# Patient Record
Sex: Female | Born: 1960 | Race: Black or African American | Hispanic: No | Marital: Married | Smoking: Never smoker
Health system: Southern US, Community
[De-identification: ages and names within clinical notes are randomized; demographics above are authoritative.]

## PROBLEM LIST (undated history)

## (undated) DIAGNOSIS — K219 Gastro-esophageal reflux disease without esophagitis: Secondary | ICD-10-CM

## (undated) DIAGNOSIS — Z8742 Personal history of other diseases of the female genital tract: Secondary | ICD-10-CM

## (undated) DIAGNOSIS — Z9889 Other specified postprocedural states: Secondary | ICD-10-CM

## (undated) DIAGNOSIS — R7989 Other specified abnormal findings of blood chemistry: Secondary | ICD-10-CM

## (undated) DIAGNOSIS — J358 Other chronic diseases of tonsils and adenoids: Secondary | ICD-10-CM

## (undated) DIAGNOSIS — M5030 Other cervical disc degeneration, unspecified cervical region: Secondary | ICD-10-CM

## (undated) DIAGNOSIS — D72819 Decreased white blood cell count, unspecified: Secondary | ICD-10-CM

## (undated) DIAGNOSIS — R7303 Prediabetes: Secondary | ICD-10-CM

## (undated) DIAGNOSIS — E039 Hypothyroidism, unspecified: Secondary | ICD-10-CM

## (undated) DIAGNOSIS — E785 Hyperlipidemia, unspecified: Secondary | ICD-10-CM

## (undated) DIAGNOSIS — G4733 Obstructive sleep apnea (adult) (pediatric): Secondary | ICD-10-CM

## (undated) DIAGNOSIS — N939 Abnormal uterine and vaginal bleeding, unspecified: Secondary | ICD-10-CM

## (undated) DIAGNOSIS — E041 Nontoxic single thyroid nodule: Secondary | ICD-10-CM

## (undated) DIAGNOSIS — E739 Lactose intolerance, unspecified: Secondary | ICD-10-CM

## (undated) DIAGNOSIS — Z9989 Dependence on other enabling machines and devices: Secondary | ICD-10-CM

## (undated) DIAGNOSIS — Z973 Presence of spectacles and contact lenses: Secondary | ICD-10-CM

## (undated) DIAGNOSIS — N393 Stress incontinence (female) (male): Secondary | ICD-10-CM

## (undated) HISTORY — DX: Decreased white blood cell count, unspecified: D72.819

## (undated) HISTORY — DX: Abnormal uterine and vaginal bleeding, unspecified: N93.9

## (undated) HISTORY — DX: Gastro-esophageal reflux disease without esophagitis: K21.9

## (undated) HISTORY — DX: Hypothyroidism, unspecified: E03.9

## (undated) HISTORY — DX: Lactose intolerance, unspecified: E73.9

## (undated) HISTORY — DX: Other specified abnormal findings of blood chemistry: R79.89

---
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
## 1996-04-24 HISTORY — PX: DILATION AND CURETTAGE OF UTERUS: SHX78

## 2001-04-24 HISTORY — PX: KNEE ARTHROSCOPY: SUR90

## 2006-06-27 ENCOUNTER — Ambulatory Visit: Payer: Self-pay | Admitting: Family Medicine

## 2006-07-02 ENCOUNTER — Encounter: Payer: Self-pay | Admitting: Family Medicine

## 2006-07-02 LAB — CONVERTED CEMR LAB
ALT: 12 units/L (ref 0–35)
AST: 15 units/L (ref 0–37)
Albumin: 4.2 g/dL (ref 3.5–5.2)
Alkaline Phosphatase: 43 units/L (ref 39–117)
BUN: 11 mg/dL (ref 6–23)
Basophils Absolute: 0 10*3/uL (ref 0.0–0.1)
Basophils Relative: 1 % (ref 0–1)
Bilirubin, Direct: 0.1 mg/dL (ref 0.0–0.3)
Chloride: 104 meq/L (ref 96–112)
Cholesterol: 175 mg/dL (ref 0–200)
Creatinine, Ser: 0.98 mg/dL (ref 0.40–1.20)
Glucose, Bld: 72 mg/dL (ref 70–99)
Indirect Bilirubin: 0.7 mg/dL (ref 0.0–0.9)
LDL Cholesterol: 114 mg/dL — ABNORMAL HIGH (ref 0–99)
Lymphocytes Relative: 38 % (ref 12–46)
Monocytes Relative: 9 % (ref 3–11)
Neutro Abs: 1.9 10*3/uL (ref 1.7–7.7)
Nitrite: NEGATIVE
Potassium: 4.1 meq/L (ref 3.5–5.3)
RBC: 4.19 M/uL (ref 3.87–5.11)
RDW: 12.3 % (ref 11.5–14.0)
TSH: 2.324 microintl units/mL (ref 0.350–5.50)
Total CHOL/HDL Ratio: 3.6
Triglycerides: 64 mg/dL (ref <150)
VLDL: 13 mg/dL (ref 0–40)

## 2006-07-04 ENCOUNTER — Ambulatory Visit (HOSPITAL_COMMUNITY): Admission: RE | Admit: 2006-07-04 | Discharge: 2006-07-04 | Payer: Self-pay | Admitting: Family Medicine

## 2006-07-11 ENCOUNTER — Encounter (HOSPITAL_COMMUNITY): Admission: RE | Admit: 2006-07-11 | Discharge: 2006-08-10 | Payer: Self-pay | Admitting: Family Medicine

## 2006-12-26 ENCOUNTER — Ambulatory Visit: Payer: Self-pay | Admitting: Family Medicine

## 2007-01-09 ENCOUNTER — Ambulatory Visit (HOSPITAL_COMMUNITY): Admission: RE | Admit: 2007-01-09 | Discharge: 2007-01-09 | Payer: Self-pay | Admitting: Family Medicine

## 2007-05-15 ENCOUNTER — Ambulatory Visit: Payer: Self-pay | Admitting: Family Medicine

## 2007-05-23 ENCOUNTER — Encounter: Payer: Self-pay | Admitting: Family Medicine

## 2007-05-23 DIAGNOSIS — B351 Tinea unguium: Secondary | ICD-10-CM | POA: Insufficient documentation

## 2007-05-23 DIAGNOSIS — K219 Gastro-esophageal reflux disease without esophagitis: Secondary | ICD-10-CM

## 2007-05-23 DIAGNOSIS — E785 Hyperlipidemia, unspecified: Secondary | ICD-10-CM | POA: Insufficient documentation

## 2007-05-24 ENCOUNTER — Encounter: Payer: Self-pay | Admitting: Family Medicine

## 2007-05-24 LAB — CONVERTED CEMR LAB
Basophils Absolute: 0 10*3/uL (ref 0.0–0.1)
Basophils Relative: 1 % (ref 0–1)
Cholesterol: 167 mg/dL (ref 0–200)
Eosinophils Absolute: 0.1 10*3/uL (ref 0.0–0.7)
Eosinophils Relative: 3 % (ref 0–5)
Hemoglobin: 12.2 g/dL (ref 12.0–15.0)
LDL Cholesterol: 103 mg/dL — ABNORMAL HIGH (ref 0–99)
Lymphocytes Relative: 37 % (ref 12–46)
MCHC: 33.5 g/dL (ref 30.0–36.0)
Monocytes Absolute: 0.4 10*3/uL (ref 0.1–1.0)
Platelets: 150 10*3/uL (ref 150–400)
RBC: 4.17 M/uL (ref 3.87–5.11)
RDW: 12.2 % (ref 11.5–15.5)
WBC: 3.2 10*3/uL — ABNORMAL LOW (ref 4.0–10.5)

## 2007-05-29 ENCOUNTER — Ambulatory Visit: Payer: Self-pay | Admitting: Gastroenterology

## 2007-10-15 ENCOUNTER — Ambulatory Visit (HOSPITAL_COMMUNITY): Admission: RE | Admit: 2007-10-15 | Discharge: 2007-10-15 | Payer: Self-pay | Admitting: Family Medicine

## 2007-10-15 ENCOUNTER — Ambulatory Visit: Payer: Self-pay | Admitting: Family Medicine

## 2007-10-15 LAB — CONVERTED CEMR LAB
Basophils Absolute: 0 10*3/uL (ref 0.0–0.1)
Basophils Relative: 0 % (ref 0–1)
Eosinophils Absolute: 0.1 10*3/uL (ref 0.0–0.7)
HCT: 33.3 % — ABNORMAL LOW (ref 36.0–46.0)
Hemoglobin: 11.7 g/dL — ABNORMAL LOW (ref 12.0–15.0)
Lymphocytes Relative: 24 % (ref 12–46)
Lymphs Abs: 0.7 10*3/uL (ref 0.7–4.0)
MCHC: 35.2 g/dL (ref 30.0–36.0)
MCV: 86.8 fL (ref 78.0–100.0)
RBC: 3.84 M/uL — ABNORMAL LOW (ref 3.87–5.11)
RDW: 12.8 % (ref 11.5–15.5)
WBC: 3.1 10*3/uL — ABNORMAL LOW (ref 4.0–10.5)

## 2007-11-08 ENCOUNTER — Ambulatory Visit: Payer: Self-pay | Admitting: Family Medicine

## 2007-11-08 ENCOUNTER — Ambulatory Visit (HOSPITAL_COMMUNITY): Admission: RE | Admit: 2007-11-08 | Discharge: 2007-11-08 | Payer: Self-pay | Admitting: Family Medicine

## 2007-11-08 LAB — CONVERTED CEMR LAB
ALT: 22 units/L (ref 0–35)
AST: 21 units/L (ref 0–37)
Alkaline Phosphatase: 42 units/L (ref 39–117)
Amylase: 83 units/L (ref 27–131)
BUN: 9 mg/dL (ref 6–23)
Bilirubin, Direct: 0.1 mg/dL (ref 0.0–0.3)
CO2: 27 meq/L (ref 19–32)
Indirect Bilirubin: 0.8 mg/dL (ref 0.0–0.9)
Lipase: 16 units/L (ref 11–59)
Lymphocytes Relative: 15 % (ref 12–46)
Lymphs Abs: 0.8 10*3/uL (ref 0.7–4.0)
Monocytes Absolute: 0.6 10*3/uL (ref 0.1–1.0)
RBC: 3.85 M/uL — ABNORMAL LOW (ref 3.87–5.11)
RDW: 12.6 % (ref 11.5–15.5)
Sed Rate: 44 mm/hr — ABNORMAL HIGH (ref 0–22)
Total Bilirubin: 0.9 mg/dL (ref 0.3–1.2)
Total Protein: 7 g/dL (ref 6.0–8.3)
WBC: 5.2 10*3/uL (ref 4.0–10.5)

## 2007-11-09 ENCOUNTER — Encounter: Payer: Self-pay | Admitting: Family Medicine

## 2007-11-09 LAB — CONVERTED CEMR LAB
Hemoglobin, Urine: NEGATIVE
Ketones, ur: NEGATIVE mg/dL
Leukocytes, UA: NEGATIVE
pH: 6.5 (ref 5.0–8.0)

## 2007-11-29 ENCOUNTER — Encounter: Payer: Self-pay | Admitting: Family Medicine

## 2007-11-29 ENCOUNTER — Ambulatory Visit: Payer: Self-pay | Admitting: Family Medicine

## 2007-12-04 ENCOUNTER — Ambulatory Visit: Payer: Self-pay | Admitting: Infectious Disease

## 2007-12-04 DIAGNOSIS — N83209 Unspecified ovarian cyst, unspecified side: Secondary | ICD-10-CM | POA: Insufficient documentation

## 2007-12-04 DIAGNOSIS — Z9189 Other specified personal risk factors, not elsewhere classified: Secondary | ICD-10-CM | POA: Insufficient documentation

## 2007-12-04 DIAGNOSIS — R109 Unspecified abdominal pain: Secondary | ICD-10-CM

## 2007-12-04 LAB — CONVERTED CEMR LAB
Alkaline Phosphatase: 49 units/L (ref 39–117)
Basophils Absolute: 0 10*3/uL (ref 0.0–0.1)
Basophils Relative: 0 % (ref 0–1)
Chloride: 105 meq/L (ref 96–112)
HCT: 35.2 % — ABNORMAL LOW (ref 36.0–46.0)
Hemoglobin: 11.7 g/dL — ABNORMAL LOW (ref 12.0–15.0)
Lymphs Abs: 1.5 10*3/uL (ref 0.7–4.0)
Monocytes Absolute: 0.3 10*3/uL (ref 0.1–1.0)
Potassium: 4 meq/L (ref 3.5–5.3)
RBC: 4.12 M/uL (ref 3.87–5.11)
RDW: 12.5 % (ref 11.5–15.5)
WBC: 3.9 10*3/uL — ABNORMAL LOW (ref 4.0–10.5)

## 2009-04-02 ENCOUNTER — Ambulatory Visit (HOSPITAL_COMMUNITY): Admission: RE | Admit: 2009-04-02 | Discharge: 2009-04-02 | Payer: Self-pay | Admitting: Family Medicine

## 2009-04-14 ENCOUNTER — Ambulatory Visit: Payer: Self-pay | Admitting: Family Medicine

## 2009-04-24 DIAGNOSIS — E663 Overweight: Secondary | ICD-10-CM | POA: Insufficient documentation

## 2009-04-24 DIAGNOSIS — M25569 Pain in unspecified knee: Secondary | ICD-10-CM | POA: Insufficient documentation

## 2009-04-24 DIAGNOSIS — E669 Obesity, unspecified: Secondary | ICD-10-CM | POA: Insufficient documentation

## 2009-05-29 ENCOUNTER — Encounter: Payer: Self-pay | Admitting: Family Medicine

## 2009-05-29 LAB — CONVERTED CEMR LAB
Basophils Absolute: 0 10*3/uL (ref 0.0–0.1)
Basophils Relative: 1 % (ref 0–1)
CO2: 27 meq/L (ref 19–32)
Calcium: 8.2 mg/dL — ABNORMAL LOW (ref 8.4–10.5)
Chloride: 105 meq/L (ref 96–112)
Creatinine, Ser: 0.91 mg/dL (ref 0.40–1.20)
Eosinophils Absolute: 0.1 10*3/uL (ref 0.0–0.7)
Eosinophils Relative: 4 % (ref 0–5)
HCT: 33.7 % — ABNORMAL LOW (ref 36.0–46.0)
Lymphocytes Relative: 38 % (ref 12–46)
Lymphs Abs: 1.2 10*3/uL (ref 0.7–4.0)
MCV: 87.5 fL (ref 78.0–100.0)
Monocytes Relative: 16 % — ABNORMAL HIGH (ref 3–12)
Neutro Abs: 1.3 10*3/uL — ABNORMAL LOW (ref 1.7–7.7)
Platelets: 167 10*3/uL (ref 150–400)
Potassium: 3.9 meq/L (ref 3.5–5.3)
RDW: 12.4 % (ref 11.5–15.5)
Total CHOL/HDL Ratio: 3.5
Triglycerides: 102 mg/dL (ref <150)
VLDL: 20 mg/dL (ref 0–40)
WBC: 3 10*3/uL — ABNORMAL LOW (ref 4.0–10.5)

## 2009-09-15 ENCOUNTER — Ambulatory Visit: Payer: Self-pay | Admitting: Family Medicine

## 2009-09-15 DIAGNOSIS — R5381 Other malaise: Secondary | ICD-10-CM

## 2009-09-15 DIAGNOSIS — D649 Anemia, unspecified: Secondary | ICD-10-CM

## 2009-09-15 DIAGNOSIS — R5383 Other fatigue: Secondary | ICD-10-CM

## 2009-09-15 DIAGNOSIS — D72819 Decreased white blood cell count, unspecified: Secondary | ICD-10-CM | POA: Insufficient documentation

## 2009-09-22 ENCOUNTER — Encounter: Payer: Self-pay | Admitting: Family Medicine

## 2009-09-22 LAB — CONVERTED CEMR LAB
Basophils Absolute: 0 10*3/uL (ref 0.0–0.1)
Basophils Relative: 0 % (ref 0–1)
Eosinophils Absolute: 0.1 10*3/uL (ref 0.0–0.7)
Eosinophils Relative: 2 % (ref 0–5)
HCT: 34.2 % — ABNORMAL LOW (ref 36.0–46.0)
Hemoglobin: 11.7 g/dL — ABNORMAL LOW (ref 12.0–15.0)
Lymphocytes Relative: 35 % (ref 12–46)
Lymphs Abs: 1.8 10*3/uL (ref 0.7–4.0)
MCHC: 34.3 g/dL (ref 30.0–36.0)
MCV: 87.7 fL (ref 78.0–100.0)
Monocytes Absolute: 0.5 10*3/uL (ref 0.1–1.0)
Monocytes Relative: 9 % (ref 3–12)
Neutro Abs: 2.8 10*3/uL (ref 1.7–7.7)
Neutrophils Relative %: 54 % (ref 43–77)
Platelets: 156 10*3/uL (ref 150–400)
RBC: 3.91 M/uL (ref 3.87–5.11)
RDW: 13.2 % (ref 11.5–15.5)
WBC: 5.2 10*3/uL (ref 4.0–10.5)

## 2009-12-03 ENCOUNTER — Ambulatory Visit: Payer: Self-pay | Admitting: Family Medicine

## 2009-12-03 DIAGNOSIS — R079 Chest pain, unspecified: Secondary | ICD-10-CM

## 2009-12-09 ENCOUNTER — Encounter (INDEPENDENT_AMBULATORY_CARE_PROVIDER_SITE_OTHER): Payer: Self-pay | Admitting: *Deleted

## 2010-03-04 ENCOUNTER — Ambulatory Visit: Payer: Self-pay | Admitting: Cardiology

## 2010-03-04 DIAGNOSIS — R9431 Abnormal electrocardiogram [ECG] [EKG]: Secondary | ICD-10-CM

## 2010-03-10 ENCOUNTER — Ambulatory Visit: Payer: Self-pay | Admitting: Cardiology

## 2010-03-10 ENCOUNTER — Ambulatory Visit (HOSPITAL_COMMUNITY): Admission: RE | Admit: 2010-03-10 | Discharge: 2010-03-10 | Payer: Self-pay | Admitting: Cardiology

## 2010-03-23 ENCOUNTER — Ambulatory Visit: Payer: Self-pay | Admitting: Family Medicine

## 2010-05-24 NOTE — Letter (Signed)
Summary: Appointment - Missed  [facility] [place]  [address] [place] [address]   Phone: [phone]  Fax: [phone]     December 09, 2009 MRN: [id number]   [facility] [address] [place], [place]  [address]   Dear [facility],  Our records indicate you missed your appointment on        12/08/09                with Dr.       .    Daleen Squibb                                It is very important that we reach you to reschedule this appointment. We look forward to participating in your health care needs. Please contact us at the number listed above at your earliest convenience to reschedule this appointment.     Sincerely,    Glass blower/designer

## 2010-05-24 NOTE — Assessment & Plan Note (Signed)
Summary: np6/rm   Visit Type:  Initial Consult Primary Provider:  Dr. Syliva Overman   History of Present Illness: 50 year old woman presents for cardiology consultation. She presents with a history of intermittent chest pain since August, described as a "pressure" typically on the left side of her chest, usually in the morning hours, and more noticeable during stressful situations. 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 She states that between a busy practice and 4 children, she has had increased stress. She has occasional headaches and chest pain episodes that are not specifically exertional. Symptoms actually got better after their initial onset in August, however within the last week or two, she has been having more episodes, somewhat more intense, one that woke her up in the morning approximately a week ago. She just recently started on Prilosec with concerns about possible reflux as an etiology.  Otherwise she has no history of hypertension, diabetes mellitus, cardiac dysrhythmia. She has had "mild" hyperlipidemia that has been treated by diet modifications. No clear family history of premature cardiovascular disease, although the patient states that her mother was diagnosed with "angina" perhaps in her 58s to 74s. Not certain whether she had definitive CAD or not.  Preventive Screening-Counseling & Management  Caffeine-Diet-Exercise     Does Patient Exercise: no  Current Medications (verified): 1)  Ginkoba 40 Mg Tabs (Ginkgo Biloba) .... Take 1 Tab Daily 2)  Daily-Vitamin  Tabs (Multiple Vitamin) .... Take 1 Tab Daily 3)  Prilosec Otc 20 Mg Tbec (Omeprazole Magnesium) .... Take 1 Tab Daily 4)  Aspirin 325 Mg Tabs (Aspirin) .... Take 1 Tab Daily  Allergies (verified): No Known Drug Allergies  Past History:  Past Surgical History: Last updated: 05/23/2007 D & C 1998 secondary to miscarriage Arthroscopy right knee 2003  Family  History: Last updated: 03/04/2010 Mother: HTN, angina, MVP, h/o intestinal polyps possibly Father: HTN, hyperlipidemia and glucose intolerance 2 brothers, 1 with GERD No colon, breat or ovarian cancer history  Social History: Last updated: 03/04/2010 Never Smoked Alcohol use - no Drug use - no Full Time - dentist Married (Dr. Gerilyn Pilgrim, neurology) Regular Exercise - no  Past Medical History: GERD Onychomycosis, left great toe Lactose intolerance Hashimoto's thyroiditis Hyperlipidemia  Family History: Mother: HTN, angina, MVP, h/o intestinal polyps possibly Father: HTN, hyperlipidemia and glucose intolerance 2 brothers, 1 with GERD No colon, breat or ovarian cancer history  Social History: Never Smoked Alcohol use - no Drug use - no Full Time - dentist Married (Dr. Gerilyn Pilgrim, neurology) Regular Exercise - no  Review of Systems       The patient complains of chest pain and headaches.  The patient denies anorexia, fever, weight gain, syncope, dyspnea on exertion, peripheral edema, prolonged cough, abdominal pain, melena, and hematochezia.         Otherwise reviewed and negative except as outlined.  Vital Signs:  Patient profile:   50 year old female Menstrual status:  regular Weight:      174 pounds BMI:     29.06 Pulse rate:   67 / minute BP sitting:   119 / 60  (right arm)  Vitals Entered By: Dreama Saa, CNA (March 04, 2010 2:20 PM)  Physical Exam  Additional Exam:  Pleasant overweight woman in no acute distress. HEENT: Conjunctiva and lids are normal, oropharynx with moist mucosa. Neck: Supple, no elevated JVP, no bruits, no thyromegaly. Lungs: Clear to auscultation, nonlabored. Cardiac: Regular rate and rhythm, no S3  gallop, no significant systolic murmur or rub. Abdomen: Soft, nontender, bowel sounds present. Extremities: No pitting edema, distal pulses are full. Skin: Warm and dry. Musculoskeletal: No kyphosis. Neuropsychiatric: Alert and  oriented x3, affect appropriate.   EKG  Procedure date:  03/04/2010  Findings:      Sinus rhythm at 63 beats per minute, possible left atrial enlargement, nonspecific T wave changes, inversion in the precordial leads. Decreased R wave progression anteriorly without frank Q waves.  Impression & Recommendations:  Problem # 1:  CHEST PAIN UNSPECIFIED (ICD-786.50)  Patient reports chest pain with both typical and atypical features for angina. Principle cardiac risk factors would be mild hyperlipidemia, possibly some cardiac history in her mother although details are not entirely clear. Resting ECG is abnormal, although overall nonspecific. She has been under a lot of stress as noted above. Symptoms have recurred after an initial improvement since August, and are somewhat more intense. She just started a proton pump inhibitor, and has not yet experienced resolution of symptoms. Reflux does remain a possibility however. In light of all these issues, we discussed proceeding with an exercise echocardiogram for more objective ischemic evaluation. If this study is reassuring, I doubt that we will need to pursue further cardiac testing at this time. This can always be reconsidered if her symptoms progress. Otherwise if stress testing is abnormal, we will bring her back to discuss additional evaluation.  Her updated medication list for this problem includes:    Aspirin 325 Mg Tabs (Aspirin) .Marland Kitchen... Take 1 tab daily  Orders: Stress Echo (Stress Echo)  Problem # 2:  ABNORMAL ELECTROCARDIOGRAM (ICD-794.31)  Overall nonspecific as described above.  Her updated medication list for this problem includes:    Aspirin 325 Mg Tabs (Aspirin) .Marland Kitchen... Take 1 tab daily  Orders: Stress Echo (Stress Echo)  Patient Instructions: 1)  Your physician recommends that you schedule a follow-up appointment in: we will contact you with test results 2)  Your physician recommends that you continue on your current medications  as directed. Please refer to the Current Medication list given to you today. 3)  Your physician has requested that you have a stress echocardiogram. For further information please visit [email or web address]  Please follow instruction sheet as given.

## 2010-05-24 NOTE — Letter (Signed)
Summary: Stress Echocardiogram Information Sheet  [facility]  [address] [place] [address]   Phone: [phone]  Fax: [phone]      March 04, 2010 MRN: [id number] light prior to the test.   [facility]  Doctor: Appointment Date: Appointment Time: Appointment Location: [facility]  Stress Echocardiogram Information Sheet    Instructions:   1. You may take your medications.  2. Do not eat or drink after midnight the night prior to test..  3. Dress prepared to exercise.  4. DO NOT use ANY caffine or tobacco products 3 hours before appointment.  5. Report to the Short Stay Center on the1st floor.  6. Please bring all current prescription medications.  7. If you have any questions, please call [phone]

## 2010-05-24 NOTE — Assessment & Plan Note (Signed)
Summary: office visit   Vital Signs:  Patient profile:   50 year old female Menstrual status:  regular Height:      65 inches Weight:      173.75 pounds BMI:     29.02 O2 Sat:      99 % Pulse rate:   74 / minute Pulse rhythm:   regular Resp:     16 per minute BP sitting:   104 / 68  (left arm) Cuff size:   large  Vitals Entered By: Everitt Amber LPN (Sep 15, 2009 1:42 PM)  Nutrition Counseling: Patient's BMI is greater than 25 and therefore counseled on weight management options. CC: Follow up chronic problems Is Patient Diabetic? No   Primary Care Provider:  Syliva Overman MD  CC:  Follow up chronic problems.  History of Present Illness: Reports  that she has been doing well. She remains extremelyu busy and often neglects her health in terms of commitment to exercise and dietary change , however , she has improved somewhat.She has to her surprise actually lost weight . Denies recent fever or chills. Denies sinus pressure, nasal congestion , ear pain or sore throat. Denies chest congestion, or cough productive of sputum. Denies chest pain, palpitations, PND, orthopnea or leg swelling. Denies abdominal pain, nausea, vomitting, diarrhea or constipation. Denies change in bowel movements or bloody stool. Denies dysuria , frequency, incontinence or hesitancy. Denies  joint pain, swelling, or reduced mobility. Denies headaches, vertigo, seizures. Denies depression, anxiety or insomnia. Denies  rash, lesions, or itch.     Current Medications (verified): 1)  None  Allergies (verified): No Known Drug Allergies  Family History: Mother LIVING   Mitral Valve Prolapse, h/o intestinal polyps possibly Father LVING Hyperlipidemia and Glucose intolerance 2 brothers LIVING  1 with GERD No colon, breat or ovarian cancer history.  Review of Systems      See HPI Eyes:  Denies blurring, discharge, eye pain, and red eye. Endo:  Denies cold intolerance, excessive hunger, excessive  thirst, excessive urination, heat intolerance, polyuria, and weight change. Heme:  Denies abnormal bruising and bleeding. Allergy:  Denies hives or rash and itching eyes.  Physical Exam  General:  Well-developed,well-nourished,in no acute distress; alert,appropriate and cooperative throughout examination HEENT: No facial asymmetry,  EOMI, No sinus tenderness, TM's Clear, oropharynx  pink and moist.   Chest: Clear to auscultation bilaterally.  CVS: S1, S2, No murmurs, No S3.   Abd: Soft, Nontender.  MS: Adequate ROM spine, hips, shoulders and  reduced in left  knee.  Ext: No edema.   CNS: CN 2-12 intact, power tone and sensation normal throughout.   Skin: Intact, no visible lesions or rashes.  Psych: Good eye contact, normal affect.  Memory intact, not anxious or depressed appearing.    Impression & Recommendations:  Problem # 1:  ANEMIA (ICD-285.9) Assessment Comment Only  Orders: T- * Misc. Laboratory test [id number])  Hgb: 11.6 (05/29/2009)   Hct: 33.7 (05/29/2009)   Platelets: 167 (05/29/2009) RBC: 3.85 (05/29/2009)   RDW: 12.4 (05/29/2009)   WBC: 3.0 (05/29/2009) MCV: 87.5 (05/29/2009)   MCHC: 34.4 (05/29/2009) TSH: 4.488 (05/29/2009)  Problem # 2:  LEUKOPENIA, CHRONIC (ICD-288.50) Assessment: Improved  Orders: Hematology Referral (Hematology), rept cbc shows nornmal wbc will cancel referral, al;so pt does not have recurrent infetions  Problem # 3:  KNEE PAIN (ICD-719.46) Assessment: Improved  Problem # 4:  OVERWEIGHT (ICD-278.02) Assessment: Improved  Ht: 65 (09/15/2009)   Wt: 173.75 (09/15/2009)   BMI: 29.02 (09/15/2009)  Other Orders: T-CBC w/Diff [id number]) T-Vitamin D (25-Hydroxy) [id number])  Patient Instructions: 1)  F/u in 5months and 3.5 weeks. 2)  It is important that you exercise regularly at least 30 minutes 6 times a week. If you develop chest pain, have severe difficulty breathing, or feel very tired , stop exercising immediately and seek  medical attention. 3)  You need to lose weight. Consider a lower calorie diet and regular exercise. congrats keep it up. 4)  pLAn to have your colonscopy and upper endo next Jan. 5)  cBC stat and anemia panel, alson vit d level  Appended Document: office visit pls cancel hematology referral, not needed at this time  Appended Document: office visit This has been cancelled

## 2010-05-24 NOTE — Assessment & Plan Note (Signed)
Summary: F UP   Vital Signs:  Patient profile:   50 year old female Menstrual status:  regular Height:      65 inches Weight:      175.75 pounds BMI:     29.35 O2 Sat:      99 % on Room air Pulse rate:   63 / minute Pulse rhythm:   regular Resp:     16 per minute BP sitting:   126 / 82  (left arm)  Vitals Entered By: Adella Hare LPN (March 23, 2010 10:40 AM)  Nutrition Counseling: Patient's BMI is greater than 25 and therefore counseled on weight management options.  O2 Flow:  Room air CC: follow-up visit Is Patient Diabetic? No Pain Assessment Patient in pain? no        Primary Care C[place] Moss:  Dr. Syliva Overman  CC:  follow-up visit.  History of Present Illness: Reports  that she has been doing fairly well, nufortunately , her hectic lifestyle is unchanged, and she again recognizes and acknowledges the need to make these changes. Since her last visit , she had a cardiology eval and was cleared. Denies recent fever or chills. Denies sinus pressure, nasal congestion , ear pain or sore throat. Denies chest congestion, or cough productive of sputum. Denies chest pain, palpitations, PND, orthopnea or leg swelling. Denies abdominal pain, nausea, vomitting, diarrhea or constipation. Denies change in bowel movements or bloody stool. Denies dysuria , frequency, incontinence or hesitancy. Denies  joint pain, swelling, or reduced mobility. Denies headaches, vertigo, seizures. Denies depression, anxiety or insomnia. Denies  rash, lesions, or itch.     Current Medications (verified): 1)  Ginkoba 40 Mg Tabs (Ginkgo Biloba) .... Take 1 Tab Daily 2)  Daily-Vitamin  Tabs (Multiple Vitamin) .... Take 1 Tab Daily  Allergies (verified): No Known Drug Allergies  Review of Systems      See HPI General:  Complains of fatigue. Eyes:  Complains of vision loss-both eyes; denies blurring, discharge, eye pain, and red eye. Endo:  Denies cold intolerance, excessive hunger,  excessive thirst, excessive urination, and heat intolerance. Heme:  Denies abnormal bruising and bleeding. Allergy:  Denies hives or rash and itching eyes.  Physical Exam  General:  Well-developed,overweight,in no acute distress; alert,appropriate and cooperative throughout examination HEENT: No facial asymmetry,  EOMI, No sinus tenderness, TM's Clear, oropharynx  pink and moist.   Chest: Clear to auscultation bilaterally.  CVS: S1, S2, No murmurs, No S3.   Abd: Soft, Nontender.  MS: Adequate ROM spine, hips, shoulders and knees.  Ext: No edema.   CNS: CN 2-12 intact, power tone and sensation normal throughout.   Skin: Intact, no visible lesions or rashes.  Psych: Good eye contact, normal affect.  Memory intact, not anxious or depressed appearing.    Impression & Recommendations:  Problem # 1:  OVERWEIGHT (ICD-278.02) Assessment Unchanged  Ht: 65 (03/23/2010)   Wt: 175.75 (03/23/2010)   BMI: 29.35 (03/23/2010)  Complete Medication List: 1)  Ginkoba 40 Mg Tabs (Ginkgo biloba) .... Take 1 tab daily 2)  Daily-vitamin Tabs (Multiple vitamin) .... Take 1 tab daily  Other Orders: T-Basic Metabolic Panel [id number]) T-Lipid Profile [id number]) T-CBC w/Diff [id number])  Patient Instructions: 1)  Please schedule a follow-up appointment in 4.5 months. 2)  It is important that you exercise regularly at least 20 minutes 5 times a week. If you develop chest pain, have severe difficulty breathing, or feel very tired , stop exercising immediately and seek medical attention. 3)  You  need to lose weight. Consider a lower calorie diet and regular exercise.    Orders Added: 1)  Est. Patient Level III [[address]] 2)  T-Basic Metabolic Panel [[id number]] 3)  T-Lipid Profile [[id number]] 4)  T-CBC w/Diff [[id number]]

## 2010-05-24 NOTE — Letter (Signed)
Summary: Letter  Letter   Imported By: Lind Guest 09/23/2009 07:55:08  _____________________________________________________________________  External Attachment:    Type:   Image     Comment:   External Document

## 2010-05-24 NOTE — Assessment & Plan Note (Signed)
Summary: OV   Vital Signs:  Patient profile:   50 year old female Menstrual status:  regular Height:      65 inches Weight:      177 pounds BMI:     29.56 O2 Sat:      99 % Pulse rate:   68 / minute Pulse rhythm:   regular Resp:     16 per minute BP sitting:   120 / 70  (left arm) Cuff size:   large  Vitals Entered By: Everitt Amber LPN (December 03, 2009 9:36 AM)  Nutrition Counseling: Patient's BMI is greater than 25 and therefore counseled on weight management options. CC: Follow up chronic problems   Primary Care Provider:  Syliva Overman MD  CC:  Follow up chronic problems.  History of Present Illness: chest pain x 3 weeks, primarily aggravated by upper body movemen this week at the end of the day she asked to be left while experiencing these symptoms.No other cv signs with it, specifically denies radiation, near syncope, diaphoresis, light headedness. She states since the episodes have been recurrent and increasing in frequency, her spouse became concerned also, and advised she get checked. 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 She is al;so the mother of 4 children. she doe snot incorporate downtime regulalrly for herself either for exercise or relaxation, and on a recent vacation she was stilldoing work [employer].  she has noted excessive belching and gERD symptoms has started again in the past 1 week, prilosec has helped.belching  is still excessive   Allergies: No Known Drug Allergies  Review of Systems      See HPI General:  Complains of fatigue; INADEQUATE  sleep most of the time 4 to 5 hrs, always working . Eyes:  Complains of vision loss-both eyes; wears corrective lenses. ENT:  Denies hoarseness, nasal congestion, and sinus pressure. CV:  Complains of chest pain or discomfort; 3 week h/o chest oppainextending from left side , substernal to right  anterior. Resp:  Denies cough and sputum productive. GI:  Complains of abdominal pain; denies constipation, diarrhea, nausea, and vomiting; gerd recent flare , oTC med working. GU:  Denies dysuria and urinary frequency. MS:  Denies joint pain and stiffness. Derm:  Denies itching and rash. Neuro:  Denies headaches, seizures, and sensation of room spinning. Psych:  Denies anxiety and depression. Endo:  Denies cold intolerance, excessive hunger, excessive thirst, excessive urination, heat intolerance, polyuria, and weight change. Heme:  Denies abnormal bruising and bleeding. Allergy:  Denies hives or rash and itching eyes.  Physical Exam  General:  Well-developed,well-nourished,in no acute distress; alert,appropriate and cooperative throughout examination HEENT: No facial asymmetry,  EOMI, No sinus tenderness, TM's Clear, oropharynx  pink and moist.   Chest: Clear to auscultation bilaterally.  CVS: S1, S2, No murmurs, No S3.   Abd: Soft, Nontender.  MS: Adequate ROM spine, hips, shoulders and knees.  Ext: No edema.   CNS: CN 2-12 intact, power tone and sensation normal throughout.   Skin: Intact, no visible lesions or rashes.  Psych: Good eye contact, normal affect.  Memory intact, not anxious or depressed appearing.    Impression & Recommendations:  Problem # 1:  CHEST PAIN UNSPECIFIED (ICD-786.50) Assessment Comment Only  Orders: T-Troponin I ([id number])  negative Cardiology Referral (Cardiology), historically and in terms of data, unlikely due to cAD  EKG w/ Interpretation (93000)  nSR, no ischemia  Problem # 2:  OVERWEIGHT (ICD-278.02) Assessment: Deteriorated  Ht: 65 (12/03/2009)   Wt: 177 (12/03/2009)   BMI: 29.56 (12/03/2009)  Patient Instructions: 1)  F/u   as before , 2)  You will be referred to card asap, though i do not believe you have a cardiac problem 3)  it is VITAL you change your lifestly to incorporate 30 mins of regular physical activity and 45 minutes  of absolute relaxation, youi should aim for at least 6 hours of sleep every day. 4)  tropnin1 stat

## 2010-09-06 NOTE — Assessment & Plan Note (Signed)
Stacy [name], Stacy [name]              CHART#:  [id number]   DATE:  05/29/2007                       DOB:  December 20, 1960   REASON FOR CONSULTATION:  GERD.   REQUESTING PHYSICIAN:  Dr. Lodema Hong.   HISTORY OF PRESENT ILLNESS:  The patient is a 50 year old female who is  referred through the courtesy of Dr. Lodema Hong.  She notes a several-month  history of worsening gastroesophageal reflux disease symptoms including  indigestion, regurgitation, and water brash.  Her symptoms are usually  worse at night.  She is awakened from sleep at times.  She is taking  Tums on a p.r.n. basis.  She is also using Prilosec on a p.r.n. basis.  She admits to not taking this every day.  She was having rare GERD  symptoms, but more recently they have become frequent, especially at  night, and also after lunch she is having to take Tums as well.  She is  having symptoms at least 3 times per week.  She has also noticed  increased belching.  She denies any dysphagia or odynophagia, denies any  anorexia.  She has gained 22 pounds in the last 2 years.  She denies any  rectal bleeding or melena.  Her bowel movements are normal, soft, and  brown.  She denies any history of constipation or diarrhea.   PAST MEDICAL/SURGICAL HISTORY:  Lactose intolerance for which she takes  Lactaid for years.  She has had Hashimoto's thyroiditis,  hypercholesterolemia.  She has a remote history of peptic ulcer disease  in the 1980s.  She had a right knee arthroscopy.  She had a gallbladder  workup a couple of years ago and was found to have gallbladder sludge.  She has had a C-section and a D&C.   CURRENT MEDICATIONS:  1. Ibuprofen p.r.n.  2. Lactaid p.r.n.   ALLERGIES:  No known drug allergies.   FAMILY HISTORY:  There is no known family history of colorectal  carcinoma or other chronic GI problems.  Mother, age 87, has  hypertension.  Father, age 56, has hypertension.  She has 2 healthy  brothers.   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  She is married to Dr. Gerilyn Pilgrim and they reside together with  their 4 healthy children.  She denies any tobacco or alcohol or drug  use.   REVIEW OF SYSTEMS:  Constitutional:  See HPI.  She denies any fever or  chills.  Otherwise, negative review of systems.   PHYSICAL EXAMINATION:  VITAL SIGNS:  Weight 172 pounds, height 67  inches, temp 98.3, blood pressure 102/78, pulse 60.  GENERAL:  The patient is an alert, oriented, black female in no acute  distress.HEENT:  Sclerae clear, non injected.  Conjunctivae pink.  Oropharynx pink and moist without any lesions.NECK:  Supple without any  mass or thyromegaly.CHEST:  Heart regular rate and rhythm.  Normal S1,  S2 without any murmurs, clicks, rubs, or gallops.LUNGS:  Clear to  auscultation bilaterally.ABDOMEN:  Positive bowel sounds x4.  No bruits  auscultated, soft, nontender, nondistended without palpable masses or  hepatosplenomegaly.  No rebound, tenderness or guarding.EXTREMITIES:  Without clubbing or edema bilaterally.SKIN:  Warm and dry without any  rash or jaundice.   IMPRESSION:  The patient is a 50 year old female with a several-month  history of  worsening gastroesophageal reflux disease.  She has  significant nocturnal symptoms along with increased frequency of  episodes during the day.  She admits to sparingly using Prilosec.  With  the combination of Prilosec and Tums, she has not obtained good relief  from her symptoms.  I have suggested that we proceed with  esophagogastroduodenoscopy today for further evaluation to rule out  gastritis, erosive esophagitis, and peptic ulcer disease.  However, she  is not wanting to pursue this at this time.  We did discuss other  options which include upper GI series and a trial of proton pump  inhibitor on a daily basis.  She is opting for the latter.   PLAN:  1. Prilosec 20 mg daily.  She is to take every day for the next month      30  minutes before breakfast.  2. I did discuss esophagogastroduodenoscopy including risks and      benefits which include, but are not limited to, bleeding,      perforation, infection, and drug reaction.  She is wanting to hold      off on this at this time which I feel is reasonable.  We will      reevaluate in 1 month with Dr. Cira Servant and assess her progress.  3. We did discuss how weight gain could be contributing to her reflux.  4. Standard gastroesophageal reflux disease literature was given for      her review.   Thank you, Dr. Lodema Hong, for allowing [place] to participate in the care of  the patient.       Lorenza Burton, N.P.  Electronically Signed     Kassie Mends, M.D.  Electronically Signed    KJ/MEDQ  D:  05/30/2007  T:  05/30/2007  Job:  [id number]   cc:   Milus Mallick. Lodema Hong, M.D.

## 2010-10-20 ENCOUNTER — Other Ambulatory Visit: Payer: Self-pay | Admitting: Family Medicine

## 2010-10-20 LAB — CBC WITH DIFFERENTIAL/PLATELET
Basophils Absolute: 0 10*3/uL (ref 0.0–0.1)
Basophils Relative: 0 % (ref 0–1)
HCT: 35.3 % — ABNORMAL LOW (ref 36.0–46.0)
Hemoglobin: 12 g/dL (ref 12.0–15.0)
Lymphs Abs: 1.8 10*3/uL (ref 0.7–4.0)
MCHC: 34 g/dL (ref 30.0–36.0)
Neutrophils Relative %: 48 % (ref 43–77)
Platelets: 160 10*3/uL (ref 150–400)
RBC: 4.04 MIL/uL (ref 3.87–5.11)
RDW: 12.6 % (ref 11.5–15.5)

## 2010-10-20 LAB — BASIC METABOLIC PANEL
BUN: 13 mg/dL (ref 6–23)
CO2: 28 mEq/L (ref 19–32)
Chloride: 104 mEq/L (ref 96–112)
Creat: 1.08 mg/dL (ref 0.50–1.10)
Potassium: 4.1 mEq/L (ref 3.5–5.3)

## 2010-10-20 LAB — LIPID PANEL
HDL: 48 mg/dL (ref >39)
Total CHOL/HDL Ratio: 4 Ratio

## 2010-11-01 ENCOUNTER — Encounter: Payer: Self-pay | Admitting: Family Medicine

## 2010-11-15 ENCOUNTER — Encounter: Payer: Self-pay | Admitting: Family Medicine

## 2010-11-17 ENCOUNTER — Telehealth: Payer: Self-pay | Admitting: Family Medicine

## 2010-11-17 ENCOUNTER — Encounter: Payer: Self-pay | Admitting: Family Medicine

## 2010-11-17 ENCOUNTER — Ambulatory Visit: Payer: Self-pay | Admitting: Family Medicine

## 2010-11-17 NOTE — Telephone Encounter (Signed)
noted 

## 2010-12-06 ENCOUNTER — Encounter: Payer: Self-pay | Admitting: Family Medicine

## 2010-12-08 ENCOUNTER — Ambulatory Visit: Payer: Self-pay | Admitting: Family Medicine

## 2010-12-12 ENCOUNTER — Encounter: Payer: Self-pay | Admitting: Family Medicine

## 2010-12-21 ENCOUNTER — Encounter: Payer: Self-pay | Admitting: Family Medicine

## 2010-12-22 ENCOUNTER — Encounter: Payer: Self-pay | Admitting: Family Medicine

## 2010-12-22 ENCOUNTER — Ambulatory Visit (INDEPENDENT_AMBULATORY_CARE_PROVIDER_SITE_OTHER): Payer: Managed Care, Other (non HMO) | Admitting: Family Medicine

## 2010-12-22 ENCOUNTER — Other Ambulatory Visit: Payer: Self-pay | Admitting: Family Medicine

## 2010-12-22 VITALS — BP 120/82 | HR 81 | Resp 16 | Ht 67.0 in | Wt 177.1 lb

## 2010-12-22 DIAGNOSIS — Z139 Encounter for screening, unspecified: Secondary | ICD-10-CM

## 2010-12-22 DIAGNOSIS — D649 Anemia, unspecified: Secondary | ICD-10-CM

## 2010-12-22 DIAGNOSIS — E663 Overweight: Secondary | ICD-10-CM

## 2010-12-22 DIAGNOSIS — J019 Acute sinusitis, unspecified: Secondary | ICD-10-CM

## 2010-12-22 DIAGNOSIS — Z1211 Encounter for screening for malignant neoplasm of colon: Secondary | ICD-10-CM

## 2010-12-22 DIAGNOSIS — R5383 Other fatigue: Secondary | ICD-10-CM

## 2010-12-22 DIAGNOSIS — E785 Hyperlipidemia, unspecified: Secondary | ICD-10-CM

## 2010-12-22 DIAGNOSIS — Z13820 Encounter for screening for osteoporosis: Secondary | ICD-10-CM

## 2010-12-22 DIAGNOSIS — J209 Acute bronchitis, unspecified: Secondary | ICD-10-CM

## 2010-12-22 MED ORDER — HYDROCOD POLST-CHLORPHEN POLST 10-8 MG/5ML PO LQCR: 5.0000 mL | Freq: Two times a day (BID) | ORAL | Status: DC | PRN

## 2010-12-22 MED ORDER — BENZONATATE 100 MG PO CAPS: 100.0000 mg | ORAL_CAPSULE | Freq: Four times a day (QID) | ORAL | Status: DC | PRN

## 2010-12-22 MED ORDER — FLUCONAZOLE 150 MG PO TABS: 150.0000 mg | ORAL_TABLET | Freq: Once | ORAL | Status: AC

## 2010-12-22 MED ORDER — PENICILLIN V POTASSIUM 500 MG PO TABS: 500.0000 mg | ORAL_TABLET | Freq: Three times a day (TID) | ORAL | Status: AC

## 2010-12-22 NOTE — Patient Instructions (Signed)
F/u in 6 month.  It is important that you exercise regularly at least 30 minutes 5 times a week. If you develop chest pain, have severe difficulty breathing, or feel very tired, stop exercising immediately and seek medical attention   A healthy diet is rich in fruit, vegetables and whole grains. Poultry fish, nuts and beans are a healthy choice for protein rather then red meat. A low sodium diet and drinking 64 ounces of water daily is generally recommended. Oils and sweet should be limited. Carbohydrates especially for those who are diabetic or overweight, should be limited to 34-45 gram per meal. It is important to eat on a regular schedule, at least 3 times daily. Snacks should be primarily fruits, vegetables or nuts.   You are being treated for sinusitis and bronchitis.  You are referred for a colonscopy and mammogram  Fasting lipid, hBA1C , TSH , and vit d  End Decmber  Weight loss goal of 6 pounds

## 2010-12-31 NOTE — Assessment & Plan Note (Signed)
Antibiotic course prescribed 

## 2010-12-31 NOTE — Assessment & Plan Note (Signed)
Antibiotics prescribed 

## 2010-12-31 NOTE — Progress Notes (Signed)
  Subjective:    Patient ID: Stacy [name], female    DOB: 03/22/1961, 50 y.o.   MRN: [id number]  HPI 1 week h/o worsening head and chest congestion , with fever and chills.Sputum and nasal drainage are green. Cough is disrupting her sleep. F/u chronic issue of poor lifestyle for improved health. No regular physical activity, and eating habits need improvement also, pt has gained rather than lose weight and is concerned about this. Intends to change   Review of Systems See HPI  Denies chest pains, palpitations and leg swelling Denies abdominal pain, nausea, vomiting,diarrhea or constipation.   Denies dysuria, frequency, hesitancy or incontinence. Denies joint pain, swelling and limitation in mobility. Denies headaches, seizures, numbness, or tingling. Denies depression, anxiety or insomnia. Denies skin break down or rash.        Objective:   Physical Exam Patient alert and oriented and in no cardiopulmonary distress.Ill appearing  HEENT: No facial asymmetry, EOMI, frontal and maxillary  sinus tenderness,  oropharynx pink and moist.  Neck supple no adenopathy.  Chest: Decreased air entry, scattered crackles, no wheezes  CVS: S1, S2 no murmurs, no S3.  ABD: Soft non tender. Bowel sounds normal.  Ext: No edema  MS: Adequate ROM spine, shoulders, hips and knees.  Skin: Intact, no ulcerations or rash noted.  Psych: Good eye contact, normal affect. Memory intact not anxious or depressed appearing.  CNS: CN 2-12 intact, power, tone and sensation normal throughout.        Assessment & Plan:

## 2010-12-31 NOTE — Assessment & Plan Note (Signed)
Deteriorated. Patient re-educated about  the importance of commitment to a  minimum of 150 minutes of exercise per week. The importance of healthy food choices with portion control discussed. Encouraged to start a food diary, count calories and to consider  joining a support group. Sample diet sheets offered. Goals set by the patient for the next several months.    

## 2011-01-04 ENCOUNTER — Telehealth: Payer: Self-pay

## 2011-01-04 NOTE — Telephone Encounter (Signed)
Called, many rings and no answer.

## 2011-01-05 ENCOUNTER — Ambulatory Visit (HOSPITAL_COMMUNITY): Payer: Managed Care, Other (non HMO)

## 2011-01-05 NOTE — Telephone Encounter (Signed)
Called pt, no answer. No VM.  Mailing letter to call.

## 2011-03-06 ENCOUNTER — Telehealth: Payer: Self-pay | Admitting: Gastroenterology

## 2011-03-06 NOTE — Telephone Encounter (Signed)
Pt returning call to set up tcs with nurse. [phone]

## 2011-03-07 NOTE — Telephone Encounter (Signed)
Called work, and she is not there. LMOM at home for a return call.

## 2011-03-09 ENCOUNTER — Telehealth: Payer: Self-pay

## 2011-03-09 ENCOUNTER — Other Ambulatory Visit: Payer: Self-pay

## 2011-03-09 DIAGNOSIS — Z139 Encounter for screening, unspecified: Secondary | ICD-10-CM

## 2011-03-09 NOTE — Telephone Encounter (Signed)
Gastroenterology Pre-Procedure Form   Request Date: 04/04/2011/         Requesting Physician: Dr. Lodema Hong     PATIENT INFORMATION:  Stacy [name] is a 50 y.o., female (DOB=1960/07/07).  PROCEDURE: Procedure(s) requested: colonoscopy Procedure Reason: screening for colon cancer  PATIENT REVIEW QUESTIONS: The patient reports the following:   1. Diabetes Melitis: no 2. Joint replacements in the past 12 months: no 3. Major health problems in the past 3 months: no 4. Has an artificial valve or MVP:no 5. Has been advised in past to take antibiotics in advance of a procedure like teeth cleaning: no}    MEDICATIONS & ALLERGIES:    Patient reports the following regarding taking any blood thinners:   Plavix? no Aspirin?no Coumadin?  no  Patient confirms/reports the following medications:  Current Outpatient Prescriptions  Medication Sig Dispense Refill  . Ginkgo Biloba (GINKOBA) 40 MG TABS Take 1 tablet by mouth daily.        . Multiple Vitamin (MULTIVITAMIN) tablet Take 1 tablet by mouth daily.        . benzonatate (TESSALON PERLES) 100 MG capsule Take 1 capsule (100 mg total) by mouth every 6 (six) hours as needed for cough.  30 capsule  0  . chlorpheniramine-HYDROcodone (TUSSIONEX) 10-8 MG/5ML LQCR Take 5 mLs by mouth every 12 (twelve) hours as needed.  200 mL  0    Patient confirms/reports the following allergies:  No Known Allergies  Patient is appropriate to schedule for requested procedure(s): yes  AUTHORIZATION INFORMATION Primary Insurance:  ID #:   Group #:  Pre-Cert / Auth required: Pre-Cert / Auth #:   Secondary Insurance:   ID #: Group #:  Pre-Cert / Auth required: } Pre-Cert / Auth #:   No orders of the defined types were placed in this encounter.    SCHEDULE INFORMATION: Procedure has been scheduled as follows:  Date: 04/04/2011         Time: 10:30 AM  Location: [facility] Short Stay  This Gastroenterology Pre-Precedure Form is being routed to  the following provider(s) for review: Jonette Eva, MD

## 2011-03-13 NOTE — Telephone Encounter (Signed)
MOVI PREP SPLIT DOSING. REGULAR BREAKFAST. CLEAR LIQUIDS AFTER 9 AM. 

## 2011-03-13 NOTE — Telephone Encounter (Signed)
LMOM to call and schedule colonoscopy.  

## 2011-03-14 NOTE — Telephone Encounter (Signed)
Rx and instructions mailed.  

## 2011-03-23 ENCOUNTER — Ambulatory Visit (HOSPITAL_COMMUNITY)
Admission: RE | Admit: 2011-03-23 | Discharge: 2011-03-23 | Disposition: A | Discharge status: Home / Self Care | Payer: Managed Care, Other (non HMO) | Source: Ambulatory Visit | Attending: Family Medicine | Admitting: Family Medicine

## 2011-03-23 DIAGNOSIS — Z139 Encounter for screening, unspecified: Secondary | ICD-10-CM

## 2011-03-23 DIAGNOSIS — Z1231 Encounter for screening mammogram for malignant neoplasm of breast: Secondary | ICD-10-CM | POA: Insufficient documentation

## 2011-04-03 MED ORDER — SODIUM CHLORIDE 0.45 % IV SOLN
Freq: Once | INTRAVENOUS | Status: AC
  Administered 2011-04-04: 1000 mL via INTRAVENOUS

## 2011-04-04 ENCOUNTER — Ambulatory Visit (HOSPITAL_COMMUNITY)
Admission: RE | Admit: 2011-04-04 | Discharge: 2011-04-04 | Disposition: A | Discharge status: Home / Self Care | Payer: Managed Care, Other (non HMO) | Source: Ambulatory Visit | Attending: Gastroenterology | Admitting: Gastroenterology

## 2011-04-04 ENCOUNTER — Encounter (HOSPITAL_COMMUNITY)
Admission: RE | Disposition: A | Discharge status: Home / Self Care | Payer: Self-pay | Source: Ambulatory Visit | Attending: Gastroenterology

## 2011-04-04 ENCOUNTER — Encounter (HOSPITAL_COMMUNITY): Payer: Self-pay | Admitting: *Deleted

## 2011-04-04 DIAGNOSIS — K648 Other hemorrhoids: Secondary | ICD-10-CM | POA: Insufficient documentation

## 2011-04-04 DIAGNOSIS — Z139 Encounter for screening, unspecified: Secondary | ICD-10-CM

## 2011-04-04 DIAGNOSIS — Z1211 Encounter for screening for malignant neoplasm of colon: Secondary | ICD-10-CM

## 2011-04-04 HISTORY — PX: COLONOSCOPY: SHX5424

## 2011-04-04 SURGERY — COLONOSCOPY
Anesthesia: Moderate Sedation

## 2011-04-04 MED ORDER — MEPERIDINE HCL 100 MG/ML IJ SOLN
INTRAMUSCULAR | Status: AC
  Filled 2011-04-04: qty 2

## 2011-04-04 MED ORDER — STERILE WATER FOR IRRIGATION IR SOLN
Status: DC | PRN
  Administered 2011-04-04: 10:00:00

## 2011-04-04 MED ORDER — MEPERIDINE HCL 100 MG/ML IJ SOLN
INTRAMUSCULAR | Status: DC | PRN
  Administered 2011-04-04 (×2): 25 mg via INTRAVENOUS

## 2011-04-04 MED ORDER — MIDAZOLAM HCL 5 MG/5ML IJ SOLN
INTRAMUSCULAR | Status: AC
  Filled 2011-04-04: qty 10

## 2011-04-04 MED ORDER — MIDAZOLAM HCL 5 MG/5ML IJ SOLN
INTRAMUSCULAR | Status: DC | PRN
  Administered 2011-04-04 (×2): 1 mg via INTRAVENOUS
  Administered 2011-04-04: 2 mg via INTRAVENOUS

## 2011-04-04 NOTE — H&P (Signed)
  Primary Care Physician:  Syliva Overman, MD, MD Primary Gastroenterologist:  Dr. Darrick Penna  Pre-Procedure History & Physical: HPI:  Stacy [name] is a 50 y.o. female here for AVERAGE RISK SCREENING.  Past Medical History  Diagnosis Date  . GERD (gastroesophageal reflux disease)   . Onychomycosis     Left great toe  . Lactose intolerance   . Hashimoto thyroiditis   . Hyperlipidemia     Past Surgical History  Procedure Date  . Dilation and curettage of uterus     Seconday to miscarriage  . Knee arthroscopy 2003    right knee    Prior to Admission medications   Medication Sig Start Date End Date Taking? Authorizing Provider  benzonatate (TESSALON PERLES) 100 MG capsule Take 1 capsule (100 mg total) by mouth every 6 (six) hours as needed for cough. 12/22/10 12/22/11  Syliva Overman, MD  chlorpheniramine-HYDROcodone (TUSSIONEX) 10-8 MG/5ML LQCR Take 5 mLs by mouth every 12 (twelve) hours as needed. 12/22/10   Syliva Overman, MD  Ginkgo Biloba [facility]) 40 MG TABS Take 1 tablet by mouth daily.      Historical Provider, MD  Multiple Vitamin (MULTIVITAMIN) tablet Take 1 tablet by mouth daily.      Historical Provider, MD    Allergies as of 03/09/2011  . (No Known Allergies)    Family History  Problem Relation Age of Onset  . Hypertension Mother   . Angina Mother     MVP. h/o intestinal polyps possibly  . Hypertension Father   . Hyperlipidemia Father     glucose intolerant  . GER disease Brother   . Colon cancer Neg Hx   . Breast cancer Neg Hx   . Ovarian cancer Neg Hx     History   Social History  . Marital Status: Married    Spouse Name: N/A    Number of Children: N/A  . Years of Education: N/A   Occupational History  . Full time: [employer]    Social History Main Topics  . Smoking status: Never Smoker   . Smokeless tobacco: Not on file  . Alcohol Use: No  . Drug Use: No  . Sexually Active: Not on file   Other Topics Concern  . Not on file   Social  History Narrative   Married to Dr. Gerilyn Pilgrim, neurologyRegular exercise: No    Review of Systems: See HPI, otherwise negative ROS   Physical Exam: There were no vitals taken for this visit. General:   Alert,  pleasant and cooperative in NAD Head:  Normocephalic and atraumatic. Neck:  Supple Lungs:  Clear throughout to auscultation.    Heart:  Regular rate and rhythm. Abdomen:  Soft, nontender and nondistended. Normal bowel sounds, without guarding, and without rebound.   Neurologic:  Alert and  oriented x4;  grossly normal neurologically.  Impression/Plan:    AVERAGE RISK  PLAN: TCS TODAY

## 2011-04-04 NOTE — Op Note (Addendum)
[facility] [address] [place], [place]  [address]  COLONOSCOPY PROCEDURE REPORT  PATIENT:  Stacy, [name]  MR#:  [id number] BIRTHDATE:  [birth year], 50 yrs. old  GENDER:  female  ENDOSCOPIST:  Jonette Eva, MD REF. BY:  Syliva Overman, M.D. ASSISTANT:  PROCEDURE DATE:  04/04/2011 PROCEDURE:  Colonoscopy 40102  INDICATIONS:  AVERAGE RISK SCREENING  MEDICATIONS:   Demerol 50 mg IV, Versed 4 mg IV  DESCRIPTION OF PROCEDURE:    Physical exam was performed. Informed consent was obtained from the patient after explaining the benefits, risks, and alternatives to procedure.  The patient was connected to monitor and placed in left lateral position. Continuous oxygen was provided by nasal cannula and IV medicine administered through an indwelling cannula.  After administration of sedation and rectal exam, the patient's rectum was intubated and the EC-[id number] ([id number]) colonoscope was advanced under direct visualization to the cecum.  The scope was removed slowly by carefully examining the color, texture, anatomy, and integrity mucosa on the way out.  The patient was recovered in endoscopy and discharged home in satisfactory condition. <<PROCEDUREIMAGES>>  FINDINGS:  Internal Hemorrhoids were found.  Normal colonoscopy without polyps, masses, vascular ectasias, or inflammatory changes.  PREP QUALITY:  GOOD CECAL W/D TIME:    11 minutes  COMPLICATIONS:    None  ENDOSCOPIC IMPRESSION: 1) Internal hemorrhoids 2) Nl colonoscopy-SLIGHTLY TORTUOUS COLON  RECOMMENDATIONS: TCS IN 10 YEARS HIGH FIBER DIET  REPEAT EXAM:  No  ______________________________ Jonette Eva, MD  CC:  Syliva Overman, M.D.  n. REVISED:  04/04/2011 10:40 AM eSIGNED:   Jonette Eva at 04/04/2011 10:40 AM  Isaiah Serge, [phone]

## 2011-04-12 ENCOUNTER — Encounter (HOSPITAL_COMMUNITY): Payer: Self-pay | Admitting: Gastroenterology

## 2011-04-13 ENCOUNTER — Ambulatory Visit: Payer: Medicare HMO | Admitting: Family Medicine

## 2011-04-26 ENCOUNTER — Encounter: Payer: Self-pay | Admitting: Family Medicine

## 2011-04-27 ENCOUNTER — Encounter: Payer: Self-pay | Admitting: Family Medicine

## 2011-04-27 ENCOUNTER — Ambulatory Visit (INDEPENDENT_AMBULATORY_CARE_PROVIDER_SITE_OTHER): Payer: Managed Care, Other (non HMO) | Admitting: Family Medicine

## 2011-04-27 ENCOUNTER — Telehealth: Payer: Self-pay | Admitting: Family Medicine

## 2011-04-27 DIAGNOSIS — R5381 Other malaise: Secondary | ICD-10-CM

## 2011-04-27 DIAGNOSIS — N83202 Unspecified ovarian cyst, left side: Secondary | ICD-10-CM

## 2011-04-27 DIAGNOSIS — E663 Overweight: Secondary | ICD-10-CM

## 2011-04-27 DIAGNOSIS — J019 Acute sinusitis, unspecified: Secondary | ICD-10-CM

## 2011-04-27 DIAGNOSIS — N83209 Unspecified ovarian cyst, unspecified side: Secondary | ICD-10-CM

## 2011-04-27 DIAGNOSIS — R5383 Other fatigue: Secondary | ICD-10-CM

## 2011-04-27 DIAGNOSIS — J0100 Acute maxillary sinusitis, unspecified: Secondary | ICD-10-CM

## 2011-04-27 DIAGNOSIS — E785 Hyperlipidemia, unspecified: Secondary | ICD-10-CM

## 2011-04-27 MED ORDER — AZITHROMYCIN 250 MG PO TABS: ORAL_TABLET | ORAL | Status: AC

## 2011-04-27 NOTE — Patient Instructions (Signed)
F/u in 6 months.  You are being treated for acute maxillary sinusitis , a z pack has been sent to your pharmacy.  It is important for you to commit to daily exercise at least 4 days per week , 30 mins each day, minimum.  A healthy diet is rich in fruit, vegetables and whole grains. Poultry fish, nuts and beans are a healthy choice for protein rather then red meat. A low sodium diet and drinking 64 ounces of water daily is generally recommended. Oils and sweet should be limited. Carbohydrates especially for those who are diabetic or overweight, should be limited to 34-45 gram per meal. It is important to eat on a regular schedule, at least 3 times daily. Snacks should be primarily fruits, vegetables or nuts.   You are referred for a pelvic US due to heavy painful cycles and h/o ovarian cyst.  Fasting labs asap.  Your bad cholesterol is high, please make dietary changes as discussed  Weight loss goal of 6 to 8 pounds

## 2011-04-28 NOTE — Telephone Encounter (Signed)
Patient is aware 

## 2011-04-30 NOTE — Assessment & Plan Note (Signed)
Rept pelvic US to re eval, also evaluate fibroid

## 2011-04-30 NOTE — Assessment & Plan Note (Signed)
Unchanged. Patient re-educated about  the importance of commitment to a  minimum of 150 minutes of exercise per week. The importance of healthy food choices with portion control discussed. Encouraged to start a food diary, count calories and to consider  joining a support group. Sample diet sheets offered. Goals set by the patient for the next several months.    

## 2011-04-30 NOTE — Progress Notes (Signed)
Subjective:     Patient ID: Stacy [name], female   DOB: 11/28/1960, 51 y.o.   MRN: [id number]  HPI The PT is here for follow up and re-evaluation of chronic medical conditions, medication management and review of any available recent lab and radiology data.  Preventive health is updated, specifically  Cancer screening and Immunization.   Questions or concerns regarding consultations or procedures which the PT has had in the interim are  Addressed.Has recently had a normal colonosocopy, and mammogram is UTD. Pap  Still pending, 2 unsuccesful attempts recently 3 day h/o increased sinus pressure with yellow nasal drainage and sore throat, intermittent chills, and sore throat, no fever   Review of Systems See HPI  Denies chest congestion, productive cough or wheezing. Denies chest pains, palpitations and leg swelling Denies abdominal pain, nausea, vomiting,diarrhea or constipation.   Denies dysuria, frequency, hesitancy or incontinence. Denies joint pain, swelling and limitation in mobility. Denies headaches, seizures, numbness, or tingling. Denies depression, anxiety or insomnia. Denies skin break down or rash.        Objective:   Physical Exam Patient alert and oriented and in no cardiopulmonary distress.  HEENT: No facial asymmetry, EOMI, right maxillary sinus tenderness,  oropharynx pink and moist.  Neck supple no adenopathy.  Chest: Clear to auscultation bilaterally.  CVS: S1, S2 no murmurs, no S3.  ABD: Soft non tender. Bowel sounds normal.  Ext: No edema  MS: Adequate ROM spine, shoulders, hips and knees.  Skin: Intact, no ulcerations or rash noted.  Psych: Good eye contact, normal affect. Memory intact not anxious or depressed appearing.  CNS: CN 2-12 intact, power, tone and sensation normal throughout.     Assessment:        Plan:

## 2011-04-30 NOTE — Assessment & Plan Note (Signed)
z pack prescribed 

## 2011-04-30 NOTE — Assessment & Plan Note (Signed)
Hyperlipidemia:Low fat diet discussed and encouraged.   Rept lab in 6 month

## 2011-05-04 ENCOUNTER — Ambulatory Visit (HOSPITAL_COMMUNITY)
Admission: RE | Admit: 2011-05-04 | Discharge: 2011-05-04 | Disposition: A | Discharge status: Home / Self Care | Payer: Managed Care, Other (non HMO) | Source: Ambulatory Visit | Attending: Family Medicine | Admitting: Family Medicine

## 2011-05-04 DIAGNOSIS — N949 Unspecified condition associated with female genital organs and menstrual cycle: Secondary | ICD-10-CM | POA: Insufficient documentation

## 2011-05-04 DIAGNOSIS — N83202 Unspecified ovarian cyst, left side: Secondary | ICD-10-CM

## 2011-05-04 DIAGNOSIS — N920 Excessive and frequent menstruation with regular cycle: Secondary | ICD-10-CM | POA: Insufficient documentation

## 2011-05-04 DIAGNOSIS — D259 Leiomyoma of uterus, unspecified: Secondary | ICD-10-CM | POA: Insufficient documentation

## 2011-05-04 DIAGNOSIS — N83209 Unspecified ovarian cyst, unspecified side: Secondary | ICD-10-CM | POA: Insufficient documentation

## 2011-09-19 ENCOUNTER — Telehealth: Payer: Self-pay | Admitting: Family Medicine

## 2011-09-19 NOTE — Telephone Encounter (Signed)
Pt aware and order faxed to the lab

## 2011-09-19 NOTE — Telephone Encounter (Signed)
Called patient back x 2. Have order ready to fax when I receive fax number

## 2011-09-20 ENCOUNTER — Other Ambulatory Visit: Payer: Self-pay | Admitting: Family Medicine

## 2011-09-20 LAB — CBC
HCT: 34.8 % — ABNORMAL LOW (ref 36.0–46.0)
MCH: 29.6 pg (ref 26.0–34.0)
MCV: 85.1 fL (ref 78.0–100.0)
Platelets: 163 10*3/uL (ref 150–400)
RDW: 13.6 % (ref 11.5–15.5)

## 2011-09-20 LAB — LIPID PANEL
LDL Cholesterol: 121 mg/dL — ABNORMAL HIGH (ref 0–99)
Total CHOL/HDL Ratio: 4.6 Ratio

## 2011-09-21 ENCOUNTER — Ambulatory Visit (INDEPENDENT_AMBULATORY_CARE_PROVIDER_SITE_OTHER): Payer: Managed Care, Other (non HMO) | Admitting: Family Medicine

## 2011-09-21 ENCOUNTER — Other Ambulatory Visit: Payer: Self-pay | Admitting: Gastroenterology

## 2011-09-21 ENCOUNTER — Encounter: Payer: Self-pay | Admitting: Family Medicine

## 2011-09-21 ENCOUNTER — Telehealth: Payer: Self-pay | Admitting: Gastroenterology

## 2011-09-21 VITALS — BP 126/78 | HR 94 | Resp 18 | Ht 67.0 in | Wt 185.0 lb

## 2011-09-21 DIAGNOSIS — K219 Gastro-esophageal reflux disease without esophagitis: Secondary | ICD-10-CM

## 2011-09-21 DIAGNOSIS — R1310 Dysphagia, unspecified: Secondary | ICD-10-CM

## 2011-09-21 DIAGNOSIS — R1013 Epigastric pain: Secondary | ICD-10-CM

## 2011-09-21 DIAGNOSIS — E039 Hypothyroidism, unspecified: Secondary | ICD-10-CM

## 2011-09-21 DIAGNOSIS — N83209 Unspecified ovarian cyst, unspecified side: Secondary | ICD-10-CM

## 2011-09-21 DIAGNOSIS — N83202 Unspecified ovarian cyst, left side: Secondary | ICD-10-CM

## 2011-09-21 DIAGNOSIS — E785 Hyperlipidemia, unspecified: Secondary | ICD-10-CM

## 2011-09-21 DIAGNOSIS — E663 Overweight: Secondary | ICD-10-CM

## 2011-09-21 LAB — HEMOGLOBIN A1C: Hgb A1c MFr Bld: 5.8 % — ABNORMAL HIGH (ref <5.7)

## 2011-09-21 LAB — T4, FREE: Free T4: 0.7 ng/dL — ABNORMAL LOW (ref 0.80–1.80)

## 2011-09-21 LAB — T3, FREE: T3, Free: 2.6 pg/mL (ref 2.3–4.2)

## 2011-09-21 MED ORDER — DEXLANSOPRAZOLE 60 MG PO CPDR: 60.0000 mg | DELAYED_RELEASE_CAPSULE | Freq: Every day | ORAL | Status: DC

## 2011-09-21 MED ORDER — SODIUM CHLORIDE 0.45 % IV SOLN
Freq: Once | INTRAVENOUS | Status: AC
  Administered 2011-09-22: 10:00:00 via INTRAVENOUS

## 2011-09-21 NOTE — Telephone Encounter (Signed)
Patient scheduled for 05/31 per [facility] 

## 2011-09-21 NOTE — Progress Notes (Signed)
  Subjective:    Patient ID: Stacy [name], female    DOB: [birth year], 51 y.o.   MRN: [id number]  HPI The PT is here for follow up and re-evaluation of chronic medical conditions, medication management and review of any available recent lab and radiology data.  Preventive health is updated, specifically  Cancer screening and Immunization.   C/o 2 month h/o uncontrolled epigastric pain, eating tums/rolaids constantly with no relief, also experiencing solid dysphagia to some extent. C/o increased fatigue and feeling cold with excessive weight gain, recent lab data shows hypothyroidism, will start replacement after US obtained. Still no regular exercise, with weight gain, some undoubtedly attributable to underactive thyroid gland     Review of Systems See HPI Denies recent fever or chills. Denies sinus pressure, nasal congestion, ear pain or sore throat. Denies chest congestion, productive cough or wheezing. Denies chest pains, palpitations and leg swelling Denies  nausea, vomiting,diarrhea or constipation.   Denies dysuria, frequency, hesitancy or incontinence. Denies joint pain, swelling and limitation in mobility. Denies headaches, seizures, numbness, or tingling. Denies depression, anxiety or insomnia. Denies skin break down or rash.        Objective:   Physical Exam Patient alert and oriented and in no cardiopulmonary distress.  HEENT: No facial asymmetry, EOMI, no sinus tenderness,  oropharynx pink and moist.  Neck supple no adenopathy.thyromegaly  Chest: Clear to auscultation bilaterally.  CVS: S1, S2 no murmurs, no S3.  ABD: Soft tender in epigastrium. Bowel sounds normal.  Ext: No edema  MS: Adequate ROM spine, shoulders, hips and knees.  Skin: Intact, no ulcerations or rash noted.  Psych: Good eye contact, normal affect. Memory intact not anxious or depressed appearing.  CNS: CN 2-12 intact, power, tone and sensation normal throughout.          Assessment & Plan:

## 2011-09-21 NOTE — Patient Instructions (Addendum)
F/u in 7 weeks.  You need to go to Dr Evelina Dun office today after leaving here, she has already contacted me to arranger for upper endoscopy in the morning.  You will be started on dexilant for refklux and you need to stop all caffeine and elevate your bed. I believe your cough is from reflux  You will be referred for an ultrasound of your thyroid gland, which is underactive, you will need to start medication for this.  TSH in 7 weeks   pls reduce fried and fatty foods and start regular exercise so that your health improves  Your blood sugars are higher than they should be, so weight loss, reduced carb diet and exercise are are all necessary

## 2011-09-21 NOTE — Telephone Encounter (Signed)
Patient scheduled for 05/31 per [facility]

## 2011-09-21 NOTE — Telephone Encounter (Signed)
SPOK WITH DR. Lodema Hong. PT I OFC. DESIRES TO PROCEED WITH EGD//DIL 5/31.

## 2011-09-21 NOTE — Telephone Encounter (Signed)
CALLED PT'S CELL-MAILBOX FULL. CALLED PT'S HOME-LEFT MESSAGE. PT MAY HAVE EGD/?DIL MAY 31 OR SHE MAY TRY BID PPI FOR 2 WEEKS AND IF NO RELIEF, THEN HAVE EGD/?DIL.

## 2011-09-21 NOTE — Telephone Encounter (Signed)
Message copied by Irish Elders on Thu Sep 21, 2011 11:55 AM ------      Message from: [place]      Created: Thu Sep 21, 2011 11:20 AM       PT NEEDS EGD/?DIL ON 5/31, DX: NEW ONSET DYSPEPSIA, DYSPHAGIA            PT IS ON HER WAY TO YOUR OFFICE.

## 2011-09-22 ENCOUNTER — Encounter (HOSPITAL_COMMUNITY): Payer: Self-pay | Admitting: *Deleted

## 2011-09-22 ENCOUNTER — Ambulatory Visit (HOSPITAL_COMMUNITY)
Admission: RE | Admit: 2011-09-22 | Discharge: 2011-09-22 | Disposition: A | Discharge status: Home / Self Care | Payer: Managed Care, Other (non HMO) | Source: Ambulatory Visit | Attending: Gastroenterology | Admitting: Gastroenterology

## 2011-09-22 ENCOUNTER — Encounter (HOSPITAL_COMMUNITY)
Admission: RE | Disposition: A | Discharge status: Home / Self Care | Payer: Self-pay | Source: Ambulatory Visit | Attending: Gastroenterology

## 2011-09-22 DIAGNOSIS — K2990 Gastroduodenitis, unspecified, without bleeding: Secondary | ICD-10-CM

## 2011-09-22 DIAGNOSIS — R1310 Dysphagia, unspecified: Secondary | ICD-10-CM

## 2011-09-22 DIAGNOSIS — E785 Hyperlipidemia, unspecified: Secondary | ICD-10-CM | POA: Insufficient documentation

## 2011-09-22 DIAGNOSIS — K449 Diaphragmatic hernia without obstruction or gangrene: Secondary | ICD-10-CM | POA: Insufficient documentation

## 2011-09-22 DIAGNOSIS — R1013 Epigastric pain: Secondary | ICD-10-CM

## 2011-09-22 DIAGNOSIS — K222 Esophageal obstruction: Secondary | ICD-10-CM

## 2011-09-22 DIAGNOSIS — K319 Disease of stomach and duodenum, unspecified: Secondary | ICD-10-CM | POA: Insufficient documentation

## 2011-09-22 DIAGNOSIS — K2940 Chronic atrophic gastritis without bleeding: Secondary | ICD-10-CM | POA: Insufficient documentation

## 2011-09-22 DIAGNOSIS — K2970 Gastritis, unspecified, without bleeding: Secondary | ICD-10-CM

## 2011-09-22 HISTORY — PX: MALONEY DILATION: SHX5535

## 2011-09-22 HISTORY — PX: SAVORY DILATION: SHX5439

## 2011-09-22 HISTORY — PX: ESOPHAGOGASTRODUODENOSCOPY: SHX5428

## 2011-09-22 LAB — BASIC METABOLIC PANEL
Chloride: 105 mEq/L (ref 96–112)
Creat: 1.09 mg/dL (ref 0.50–1.10)

## 2011-09-22 LAB — VITAMIN D 25 HYDROXY (VIT D DEFICIENCY, FRACTURES): Vit D, 25-Hydroxy: 26 ng/mL — ABNORMAL LOW (ref 30–89)

## 2011-09-22 SURGERY — EGD (ESOPHAGOGASTRODUODENOSCOPY)
Anesthesia: Moderate Sedation

## 2011-09-22 MED ORDER — BUTAMBEN-TETRACAINE-BENZOCAINE 2-2-14 % EX AERO
INHALATION_SPRAY | CUTANEOUS | Status: DC | PRN
  Administered 2011-09-22: 2 via TOPICAL

## 2011-09-22 MED ORDER — MINERAL OIL PO OIL
TOPICAL_OIL | ORAL | Status: AC
  Filled 2011-09-22: qty 30

## 2011-09-22 MED ORDER — MEPERIDINE HCL 100 MG/ML IJ SOLN
INTRAMUSCULAR | Status: AC
  Filled 2011-09-22: qty 2

## 2011-09-22 MED ORDER — STERILE WATER FOR IRRIGATION IR SOLN
Status: DC | PRN
  Administered 2011-09-22: 10:00:00

## 2011-09-22 MED ORDER — MIDAZOLAM HCL 5 MG/5ML IJ SOLN
INTRAMUSCULAR | Status: DC | PRN
  Administered 2011-09-22: 1 mg via INTRAVENOUS
  Administered 2011-09-22 (×2): 2 mg via INTRAVENOUS

## 2011-09-22 MED ORDER — MEPERIDINE HCL 100 MG/ML IJ SOLN
INTRAMUSCULAR | Status: DC | PRN
  Administered 2011-09-22 (×2): 25 mg via INTRAVENOUS

## 2011-09-22 MED ORDER — MIDAZOLAM HCL 5 MG/5ML IJ SOLN
INTRAMUSCULAR | Status: AC
  Filled 2011-09-22: qty 10

## 2011-09-22 NOTE — H&P (Signed)
  Primary Care Physician:  Syliva Overman, MD, MD Primary Gastroenterologist:  Dr. Darrick Penna  Pre-Procedure History & Physical: HPI:  Stacy [name] is a 51 y.o. female here for DYSPHAGIA/NEW ONSET DYSPEPSIA.  Past Medical History  Diagnosis Date  . GERD (gastroesophageal reflux disease)   . Onychomycosis     Left great toe  . Lactose intolerance   . Hashimoto thyroiditis   . Hyperlipidemia     Past Surgical History  Procedure Date  . Dilation and curettage of uterus     Seconday to miscarriage  . Knee arthroscopy 2003    right knee  . Colonoscopy 04/04/2011    Procedure: COLONOSCOPY;  Surgeon: Arlyce Harman, MD;  Location: [facility];  Service: Endoscopy;  Laterality: N/A;  10:30 AM    Prior to Admission medications   Medication Sig Start Date End Date Taking? Authorizing Provider  Ginkgo Biloba (GINKOBA) 40 MG TABS Take 1 tablet by mouth daily.     Yes Historical Provider, MD  Multiple Vitamin (MULTIVITAMIN) tablet Take 1 tablet by mouth daily.     Yes Historical Provider, MD  dexlansoprazole (DEXILANT) 60 MG capsule Take 1 capsule (60 mg total) by mouth daily. 09/21/11 10/21/11  Kerri Perches, MD    Allergies as of 09/21/2011  . (No Known Allergies)    Family History  Problem Relation Age of Onset  . Hypertension Mother   . Angina Mother     MVP. h/o intestinal polyps possibly  . Hypertension Father   . Hyperlipidemia Father     glucose intolerant  . GER disease Brother   . Colon cancer Neg Hx   . Breast cancer Neg Hx   . Ovarian cancer Neg Hx   . Anesthesia problems Neg Hx     History   Social History  . Marital Status: Married    Spouse Name: N/A    Number of Children: N/A  . Years of Education: N/A   Occupational History  . Full time: [employer]    Social History Main Topics  . Smoking status: Never Smoker   . Smokeless tobacco: Not on file  . Alcohol Use: No  . Drug Use: No  . Sexually Active: Not on file   Other Topics Concern  . Not  on file   Social History Narrative   Married to Dr. Gerilyn Pilgrim, neurologyRegular exercise: No    Review of Systems: See HPI, otherwise negative ROS   Physical Exam: There were no vitals taken for this visit. General:   Alert,  pleasant and cooperative in NAD Head:  Normocephalic and atraumatic. Neck:  Supple;  Lungs:  Clear throughout to auscultation.    Heart:  Regular rate and rhythm. Abdomen:  Soft, nontender and nondistended. Normal bowel sounds, without guarding, and without rebound.   Neurologic:  Alert and  oriented x4;  grossly normal neurologically.  Impression/Plan:     DYSPHAGIA/NEW ONSET DYSPEPSIA  PLAN:  EGD/?DIL TODAY

## 2011-09-22 NOTE — Telephone Encounter (Signed)
Spoke with Dr Darrick Penna, pt for [facility] on 5/31 she is aware

## 2011-09-22 NOTE — Op Note (Signed)
[facility] [address] [place], [place]  [address]  ENDOSCOPY PROCEDURE REPORT  PATIENT:  [name], Stacy  MR#:  [id number] BIRTHDATE:  Aug 11, 1960, 51 yrs. old  GENDER:  female  ENDOSCOPIST:  Jonette Eva, MD ASSISTANT: Referred by:  Syliva Overman, M.D.  PROCEDURE DATE:  09/22/2011 PROCEDURE:  EGD with biopsy ASA CLASS: INDICATIONS:  DYSPHAGIA, NEW ONSET DYSPEPSIA  PMHX: GERD DIET CONTROLLED FOR 20 YEARS  MEDICATIONS:   Demerol 50 mg IV, Versed 5 mg IV TOPICAL ANESTHETIC:  Cetacaine Spray  DESCRIPTION OF PROCEDURE:   After the risks benefits and alternatives of the procedure were thoroughly explained, informed consent was obtained.  The EG-[id number] ([id number]) endoscope was introduced through the mouth and advanced to the second portion of the duodenum.  The instrument was slowly withdrawn as the mucosa was carefully examined.  Prior to withdrawal of the scope, the guidwire was placed.  The esophagus was dilated successfully.  The patient was recovered in endoscopy and discharged home in satisfactory condition. <<PROCEDUREIMAGES>>  A stricture was found in the distal esophagus AND NARROWED LUMEN TO 13-14 MM.  Mild gastritis was found & BIOPSIEDVIA COLD FORCEPS. A 2-3 CM hiatal hernia was found.    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 eSIGNED:   Willma Obando at 09/22/2011 11:20 AM  Isaiah Serge, [phone]

## 2011-09-22 NOTE — Discharge Instructions (Signed)
I dilated your esophagus. You had a stricture AT the base of your esophagus. You have a small hiatal hernia. You have gastritis. I biopsied your stomach.   CONTINUE DEXILANT.  FOLLOW A LOW FAT DIET. SEE INFO BELOW.  TRY TO LOSE 10 TO 15 LBS.  YOUR BIOPSY WILL BE BACK IN 7 DAYS.  FOLLOW UP IN 3 MOS. UPPER ENDOSCOPY AFTER CARE Read the instructions outlined below and refer to this sheet in the next week. These discharge instructions provide you with general information on caring for yourself after you leave the hospital. While your treatment has been planned according to the most current medical practices available, unavoidable complications occasionally occur. If you have any problems or questions after discharge, call DR. Gasper Hopes, [phone].  ACTIVITY  You may resume your regular activity, but move at a slower pace for the next 24 hours.   Take frequent rest periods for the next 24 hours.   Walking will help get rid of the air and reduce the bloated feeling in your belly (abdomen).   No driving for 24 hours (because of the medicine (anesthesia) used during the test).   You may shower.   Do not sign any important legal documents or operate any machinery for 24 hours (because of the anesthesia used during the test).    NUTRITION  Drink plenty of fluids.   You may resume your normal diet as instructed by your doctor.   Begin with a light meal and progress to your normal diet. Heavy or fried foods are harder to digest and may make you feel sick to your stomach (nauseated).   Avoid alcoholic beverages for 24 hours or as instructed.    MEDICATIONS  You may resume your normal medications.   WHAT YOU CAN EXPECT TODAY  Some feelings of bloating in the abdomen.   Passage of more gas than usual.    IF YOU HAD A BIOPSY TAKEN DURING THE UPPER ENDOSCOPY:  Eat a soft diet IF YOU HAVE NAUSEA, BLOATING, ABDOMINAL PAIN, OR VOMITING.    FINDING OUT THE RESULTS OF YOUR  TEST Not all test results are available during your visit. DR. Darrick Penna WILL CALL YOU WITHIN 7 DAYS OF YOUR PROCEDUE WITH YOUR RESULTS. Do not assume everything is normal if you have not heard from DR. Shaquoya Cosper IN ONE WEEK, CALL HER OFFICE AT [phone].  SEEK IMMEDIATE MEDICAL ATTENTION AND CALL THE OFFICE: [phone] IF:  You have more than a spotting of blood in your stool.   Your belly is swollen (abdominal distention).   You are nauseated or vomiting.   You have a temperature over 101F.   You have abdominal pain or discomfort that is severe or gets worse throughout the day.   Low-Fat Diet BREADS, CEREALS, PASTA, RICE, DRIED PEAS, AND BEANS These products are high in carbohydrates and most are low in fat. Therefore, they can be increased in the diet as substitutes for fatty foods. They too, however, contain calories and should not be eaten in excess. Cereals can be eaten for snacks as well as for breakfast.  Include foods that contain fiber (fruits, vegetables, whole grains, and legumes). Research shows that fiber may lower blood cholesterol levels, especially the water-soluble fiber found in fruits, vegetables, oat products, and legumes. FRUITS AND VEGETABLES It is good to eat fruits and vegetables. Besides being sources of fiber, both are rich in vitamins and some minerals. They help you get the daily allowances of these nutrients. Fruits and vegetables can be  used for snacks and desserts. MEATS Limit lean meat, chicken, [place], and fish to no more than 6 ounces per day. Beef, Pork, and Lamb Use lean cuts of beef, pork, and lamb. Lean cuts include:  Extra-lean ground beef.  Arm roast.  Sirloin tip.  Center-cut ham.  Round steak.  Loin chops.  Rump roast.  Tenderloin.  Trim all fat off the outside of meats before cooking. It is not necessary to severely decrease the intake of red meat, but lean choices should be made. Lean meat is rich in protein and contains a highly  absorbable form of iron. Premenopausal women, in particular, should avoid reducing lean red meat because this could increase the risk for low red blood cells (iron-deficiency anemia).  Chicken and [place] These are good sources of protein. The fat of poultry can be reduced by removing the skin and underlying fat layers before cooking. Chicken and [place] can be substituted for lean red meat in the diet. Poultry should not be fried or covered with high-fat sauces. Fish and Shellfish Fish is a good source of protein. Shellfish contain cholesterol, but they usually are low in saturated fatty acids. The preparation of fish is important. Like chicken and [place], they should not be fried or covered with high-fat sauces. EGGS Egg whites contain no fat or cholesterol. They can be eaten often. Try 1 to 2 egg whites instead of whole eggs in recipes or use egg substitutes that do not contain yolk.  MILK AND DAIRY PRODUCTS Use skim or 1% milk instead of 2% or whole milk. Decrease whole milk, natural, and processed cheeses. Use nonfat or low-fat (2%) cottage cheese or low-fat cheeses made from vegetable oils. Choose nonfat or low-fat (1 to 2%) yogurt. Experiment with evaporated skim milk in recipes that call for heavy cream. Substitute low-fat yogurt or low-fat cottage cheese for sour cream in dips and salad dressings. Have at least 2 servings of low-fat dairy products, such as 2 glasses of skim (or 1%) milk each day to help get your daily calcium intake.  FATS AND OILS Butterfat, lard, and beef fats are high in saturated fat and cholesterol. These should be avoided.Vegetable fats do not contain cholesterol. AVOID coconut oil, palm oil, and palm kernel oil, WHICH are very high in saturated fats. These should be limited. These fats are often used in bakery goods, processed foods, popcorn, oils, and nondairy creamers. Vegetable shortenings and some peanut butters contain hydrogenated oils, which are also saturated fats.  Read the labels on these foods and check for saturated vegetable oils.  Desirable liquid vegetable oils are corn oil, cottonseed oil, olive oil, canola oil, safflower oil, soybean oil, and sunflower oil. Peanut oil is not as good, but small amounts are acceptable. Buy a heart-healthy tub margarine that has no partially hydrogenated oils in the ingredients. AVOID Mayonnaise and salad dressings often are made from unsaturated fats.  OTHER EATING TIPS Snacks  Most sweets should be limited as snacks. They tend to be rich in calories and fats, and their caloric content outweighs their nutritional value. Some good choices in snacks are graham crackers, melba toast, soda crackers, bagels (no egg), English muffins, fruits, and vegetables. These snacks are preferable to snack crackers, [place] fries, and chips. Popcorn should be air-popped or cooked in small amounts of liquid vegetable oil.  Desserts Eat fruit, low-fat yogurt, and fruit ices instead of pastries, cake, and cookies. Sherbet, angel food cake, gelatin dessert, frozen low-fat yogurt, or other frozen products that do  not contain saturated fat (pure fruit juice bars, frozen ice pops) are also acceptable.   COOKING METHODS Choose those methods that use little or no fat. They include: Poaching.  Braising.  Steaming.  Grilling.  Baking.  Stir-frying.  Broiling.  Microwaving.  Foods can be cooked in a nonstick pan without added fat, or use a nonfat cooking spray in regular cookware. Limit fried foods and avoid frying in saturated fat. Add moisture to lean meats by using water, broth, cooking wines, and other nonfat or low-fat sauces along with the cooking methods mentioned above. Soups and stews should be chilled after cooking. The fat that forms on top after a few hours in the refrigerator should be skimmed off. When preparing meals, avoid using excess salt. Salt can contribute to raising blood pressure in some people.  EATING AWAY FROM  HOME Order entres, potatoes, and vegetables without sauces or butter. When meat exceeds the size of a deck of cards (3 to 4 ounces), the rest can be taken home for another meal. Choose vegetable or fruit salads and ask for low-calorie salad dressings to be served on the side. Use dressings sparingly. Limit high-fat toppings, such as bacon, crumbled eggs, cheese, sunflower seeds, and olives. Ask for heart-healthy tub margarine instead of butter.  Gastritis  Gastritis is an inflammation (the body's way of reacting to injury and/or infection) of the stomach. It is often caused by viral or bacterial (germ) infections. It can also be caused BY ASPIRIN, BC/GOODY POWDER'S, (IBUPROFEN) MOTRIN, OR ALEVE (NAPROXEN), chemicals (including alcohol), SPICY FOODS, and medications. This illness may be associated with generalized malaise (feeling tired, not well), UPPER ABDOMINAL STOMACH cramps, and fever. One common bacterial cause of gastritis is an organism known as H. Pylori. This can be treated with antibiotics.    Hiatal Hernia A hiatal hernia occurs when a part of the stomach slides above the diaphragm. The diaphragm is the thin muscle separating the belly (abdomen) from the chest. A hiatal hernia can be something you are born with or develop over time. Hiatal hernias may allow stomach acid to flow back into your esophagus, the tube which carries food from your mouth to your stomach. If this acid causes problems it is called GERD (gastro-esophageal reflux disease).   SYMPTOMS Common symptoms of GERD are heartburn (burning in your chest). This is worse when lying down or bending over. It may also cause belching and indigestion. Some of the things which make GERD worse are:  Increased weight pushes on stomach making acid rise more easily.   Smoking markedly increases acid production.   Alcohol decreases lower esophageal sphincter pressure (valve between stomach and esophagus), allowing acid from stomach into  esophagus.   Late evening meals and going to bed with a full stomach increases pressure.   Anything that causes an increase in acid production.    HOME CARE INSTRUCTIONS  Try to achieve and maintain an ideal body weight.   Avoid drinking alcoholic beverages.   DO NOT smokE.   Do not wear tight clothing around your chest or stomach.   Eat smaller meals and eat more frequently. This keeps your stomach from getting too full. Eat slowly.   Do not lie down for 2 or 3 hours after eating. Do not eat or drink anything 1 to 2 hours before going to bed.   Avoid caffeine beverages (colas, coffee, cocoa, tea), fatty foods, citrus fruits and all other foods and drinks that contain acid and that seem to increase  the problems.   Avoid bending over, especially after eating OR STRAINING. Anything that increases the pressure in your belly increases the amount of acid that may be pushed up into your esophagus.    ESOPHAGEAL STRICTURE  Esophageal strictures can be caused by stomach acid backing up into the tube that carries food from the mouth down to the stomach (lower esophagus).  TREATMENT There are a number of non-prescription medicines used to treat reflux/stricture, including: Antacids.  Proton-pump inhibitors: DEXILANT   HOME CARE INSTRUCTIONS Eat 2-3 hours before going to bed.  Try to reach and maintain a healthy weight.  Do not eat just a few very large meals. Instead, eat 4 TO 6 smaller meals throughout the day.  Try to identify foods and beverages that make your symptoms worse, and avoid these.  Avoid tight clothing.  Do not exercise right after eating.

## 2011-09-24 NOTE — Assessment & Plan Note (Signed)
Deteriorated, pt to stop chocolate and caffeine, start high dose PPI and elevate HOB  Gi referral due to worsening symptoms with solid dysphagia

## 2011-09-24 NOTE — Assessment & Plan Note (Signed)
New dx wait on [place] to start treatment

## 2011-09-24 NOTE — Assessment & Plan Note (Signed)
Deteriorated. Patient re-educated about  the importance of commitment to a  minimum of 150 minutes of exercise per week. The importance of healthy food choices with portion control discussed. Encouraged to start a food diary, count calories and to consider  joining a support group. Sample diet sheets offered. Goals set by the patient for the next several months.   New dx of hypothyroid is undoubtedly contributing

## 2011-09-24 NOTE — Assessment & Plan Note (Signed)
Hyperlipidemia:Low fat diet discussed and encouraged.   

## 2011-09-24 NOTE — Assessment & Plan Note (Signed)
No cyst seen in 2013 film, has gyne f/u

## 2011-09-26 ENCOUNTER — Telehealth: Payer: Self-pay

## 2011-09-26 ENCOUNTER — Telehealth: Payer: Self-pay | Admitting: Family Medicine

## 2011-09-26 ENCOUNTER — Other Ambulatory Visit: Payer: Self-pay | Admitting: Family Medicine

## 2011-09-26 ENCOUNTER — Ambulatory Visit (HOSPITAL_COMMUNITY)
Admission: RE | Admit: 2011-09-26 | Discharge: 2011-09-26 | Disposition: A | Discharge status: Home / Self Care | Payer: Managed Care, Other (non HMO) | Source: Ambulatory Visit | Attending: Family Medicine | Admitting: Family Medicine

## 2011-09-26 ENCOUNTER — Telehealth: Payer: Self-pay | Admitting: Gastroenterology

## 2011-09-26 DIAGNOSIS — E049 Nontoxic goiter, unspecified: Secondary | ICD-10-CM | POA: Insufficient documentation

## 2011-09-26 DIAGNOSIS — E039 Hypothyroidism, unspecified: Secondary | ICD-10-CM | POA: Insufficient documentation

## 2011-09-26 NOTE — Telephone Encounter (Signed)
Letter mailed for pt to call.  

## 2011-09-26 NOTE — Telephone Encounter (Signed)
Spoke with pt re results of thyroid US, she will be treated for her thyroid condition by endo, she has a thyroid nodule, spoke with Dr Fransico Him and pt about this

## 2011-09-26 NOTE — Telephone Encounter (Signed)
Reminder in epic for patient to follow up with SF ONLY in E30 in 3 months

## 2011-09-26 NOTE — Telephone Encounter (Signed)
Results Cc to PCP  

## 2011-09-26 NOTE — Telephone Encounter (Signed)
Pt returned call and was informed of her results. She requested samples of Dexilant and I put 3 boxes #15 at front for pick-up.

## 2011-09-26 NOTE — Telephone Encounter (Signed)
noted 

## 2011-09-26 NOTE — Telephone Encounter (Signed)
CALLED pt's cell. MAILBOX FULL. CALLED WORK PHONE-NOT IN TODAY. CALLED HER HOME-NO VM SETUP. SEND A LETTER FOR PT TO CONTACT [facility] FOR RESULTS.  Her stomach Bx showed gastritis. Continue DEXILANT TO TREAT GERD. OPV IN 3 MOS FOR GERD/DYSPHAGIA, E 30 VISIT.

## 2011-09-27 ENCOUNTER — Encounter (HOSPITAL_COMMUNITY): Payer: Self-pay | Admitting: Gastroenterology

## 2011-10-19 ENCOUNTER — Telehealth: Payer: Self-pay | Admitting: Family Medicine

## 2011-10-19 NOTE — Telephone Encounter (Signed)
Called patient to see what she needed and no answer and there was no option to leave message

## 2011-10-20 NOTE — Telephone Encounter (Signed)
Toni Amend called patient again today and left message for her to call

## 2011-10-23 ENCOUNTER — Other Ambulatory Visit: Payer: Self-pay

## 2011-10-23 DIAGNOSIS — K219 Gastro-esophageal reflux disease without esophagitis: Secondary | ICD-10-CM

## 2011-10-23 MED ORDER — DEXLANSOPRAZOLE 60 MG PO CPDR: 60.0000 mg | DELAYED_RELEASE_CAPSULE | Freq: Every day | ORAL | Status: DC

## 2011-11-09 ENCOUNTER — Ambulatory Visit: Payer: Managed Care, Other (non HMO) | Admitting: Family Medicine

## 2011-11-14 ENCOUNTER — Ambulatory Visit (INDEPENDENT_AMBULATORY_CARE_PROVIDER_SITE_OTHER): Payer: Managed Care, Other (non HMO) | Admitting: Family Medicine

## 2011-11-14 ENCOUNTER — Encounter: Payer: Self-pay | Admitting: Family Medicine

## 2011-11-14 VITALS — BP 124/62 | HR 75 | Resp 18 | Ht 67.0 in | Wt 182.0 lb

## 2011-11-14 DIAGNOSIS — E663 Overweight: Secondary | ICD-10-CM

## 2011-11-14 DIAGNOSIS — K219 Gastro-esophageal reflux disease without esophagitis: Secondary | ICD-10-CM

## 2011-11-14 DIAGNOSIS — E039 Hypothyroidism, unspecified: Secondary | ICD-10-CM

## 2011-11-14 DIAGNOSIS — E785 Hyperlipidemia, unspecified: Secondary | ICD-10-CM

## 2011-11-14 NOTE — Patient Instructions (Addendum)
F/u in 3.5  Month  Please follow a 1500 calorie diet,  I suggest 2 main meals being precooked boxed meals, eg lean cuisine, healthy choice, smart ones  Otherwise commit to fresh or frozen fruit and vegetable and water  30 minutes daily of physical activity..  I will contact you afterIi speak with Dr Fransico Him  About phentemine for you

## 2011-11-18 NOTE — Assessment & Plan Note (Signed)
Being treated by endo. Pt has a thyroid nodule

## 2011-11-18 NOTE — Assessment & Plan Note (Signed)
Controlled, no change in medication  

## 2011-11-18 NOTE — Progress Notes (Signed)
  Subjective:    Patient ID: Stacy [name], female    DOB: [birth year], 51 y.o.   MRN: [id number]  HPI The PT is here for follow up and re-evaluation of chronic medical conditions, medication management and review of any available recent lab and radiology data.  Preventive health is updated, specifically  Cancer screening and Immunization.   Questions or concerns regarding consultations or procedures which the PT has had in the interim are  Addressed.Currently on low dose thyroid replacement medication with endo follow up, does not have her med with her , unable to verify the dose.  She is disappointed in that she has not lost more weight, however , little has changed in her behavior to accomodate this The PT denies any adverse reactions to current medications since the last visit.  There are no new concerns.  There are no specific complaints       Review of Systems See HPI Denies recent fever or chills. Denies sinus pressure, nasal congestion, ear pain or sore throat. Denies chest congestion, productive cough or wheezing. Denies chest pains, palpitations and leg swelling Denies abdominal pain, nausea, vomiting,diarrhea or constipation.   Denies dysuria, frequency, hesitancy or incontinence. Denies joint pain, swelling and limitation in mobility. Denies headaches, seizures, numbness, or tingling. Denies depression, anxiety or insomnia. Denies skin break down or rash.        Objective:   Physical Exam   Patient alert and oriented and in no cardiopulmonary distress.  HEENT: No facial asymmetry, EOMI, no sinus tenderness,  oropharynx pink and moist.  Neck supple no adenopathy.  Chest: Clear to auscultation bilaterally.  CVS: S1, S2 no murmurs, no S3.  ABD: Soft non tender. Bowel sounds normal.  Ext: No edema  MS: Adequate ROM spine, shoulders, hips and knees.  Skin: Intact, no ulcerations or rash noted.  Psych: Good eye contact, normal affect. Memory intact not  anxious or depressed appearing.  CNS: CN 2-12 intact, power, tone and sensation normal throughout.      Assessment & Plan:

## 2011-11-18 NOTE — Assessment & Plan Note (Signed)
Hyperlipidemia:Low fat diet discussed and encouraged.  Dietary management only

## 2011-11-18 NOTE — Assessment & Plan Note (Signed)
Improved. Pt applauded on succesful weight loss through lifestyle change, and encouraged to continue same. Weight loss goal set for the next several months. Needs to start regular physical activity and adopt startegy to control caloric intake, has changed some unhealthy habitslike snavking on candy

## 2011-12-06 ENCOUNTER — Encounter: Payer: Self-pay | Admitting: Gastroenterology

## 2011-12-27 ENCOUNTER — Encounter: Payer: Self-pay | Admitting: Gastroenterology

## 2011-12-28 ENCOUNTER — Ambulatory Visit: Payer: Managed Care, Other (non HMO) | Admitting: Gastroenterology

## 2011-12-28 ENCOUNTER — Telehealth: Payer: Self-pay | Admitting: Gastroenterology

## 2011-12-28 DIAGNOSIS — K219 Gastro-esophageal reflux disease without esophagitis: Secondary | ICD-10-CM

## 2011-12-28 MED ORDER — DEXLANSOPRAZOLE 60 MG PO CPDR: 60.0000 mg | DELAYED_RELEASE_CAPSULE | Freq: Every day | ORAL | Status: DC

## 2011-12-28 NOTE — Telephone Encounter (Signed)
CALLED PT. FORGOT ABOUT APPT. MEDS WORKING BUT DOESN'T REALLY WANT TO TAKE MEDS. SX CONTROLLED  IF SHE FOLLOWS HER DIET. REFILL DEXILANT. OPV MID OCT ON A THUR.

## 2011-12-28 NOTE — Telephone Encounter (Signed)
Pt was a no show

## 2012-01-02 NOTE — Telephone Encounter (Signed)
Reminder in epic to follow up with SF in E30 in mid Oct on a Thursday

## 2012-02-22 ENCOUNTER — Other Ambulatory Visit (HOSPITAL_COMMUNITY): Payer: Self-pay | Admitting: "Endocrinology

## 2012-02-22 DIAGNOSIS — E042 Nontoxic multinodular goiter: Secondary | ICD-10-CM

## 2012-03-05 ENCOUNTER — Ambulatory Visit: Payer: Managed Care, Other (non HMO) | Admitting: Family Medicine

## 2012-04-30 ENCOUNTER — Ambulatory Visit (HOSPITAL_COMMUNITY): Payer: Managed Care, Other (non HMO) | Attending: "Endocrinology

## 2012-05-09 ENCOUNTER — Ambulatory Visit (INDEPENDENT_AMBULATORY_CARE_PROVIDER_SITE_OTHER): Payer: Managed Care, Other (non HMO) | Admitting: Family Medicine

## 2012-05-09 ENCOUNTER — Encounter: Payer: Self-pay | Admitting: Family Medicine

## 2012-05-09 VITALS — BP 120/80 | HR 85 | Resp 16 | Ht 67.0 in | Wt 185.1 lb

## 2012-05-09 DIAGNOSIS — K219 Gastro-esophageal reflux disease without esophagitis: Secondary | ICD-10-CM

## 2012-05-09 DIAGNOSIS — R0683 Snoring: Secondary | ICD-10-CM

## 2012-05-09 DIAGNOSIS — E039 Hypothyroidism, unspecified: Secondary | ICD-10-CM

## 2012-05-09 DIAGNOSIS — Z23 Encounter for immunization: Secondary | ICD-10-CM

## 2012-05-09 DIAGNOSIS — N83202 Unspecified ovarian cyst, left side: Secondary | ICD-10-CM

## 2012-05-09 DIAGNOSIS — R5381 Other malaise: Secondary | ICD-10-CM

## 2012-05-09 DIAGNOSIS — D649 Anemia, unspecified: Secondary | ICD-10-CM

## 2012-05-09 DIAGNOSIS — E663 Overweight: Secondary | ICD-10-CM

## 2012-05-09 DIAGNOSIS — E041 Nontoxic single thyroid nodule: Secondary | ICD-10-CM

## 2012-05-09 DIAGNOSIS — N83209 Unspecified ovarian cyst, unspecified side: Secondary | ICD-10-CM

## 2012-05-09 DIAGNOSIS — E785 Hyperlipidemia, unspecified: Secondary | ICD-10-CM

## 2012-05-09 DIAGNOSIS — R0609 Other forms of dyspnea: Secondary | ICD-10-CM

## 2012-05-09 NOTE — Patient Instructions (Addendum)
F/U  In 4 month  Keep appt with Dr. Fransico Him  HBA1C, fasting lipid, cmp, cbc and iron, B12 level , pls draw same day as you are getting labs for Dr Cornelious Bryant are referred for thyroid US which is past due, copy to Dr. Fransico Him  Pls sched and keep appt for mammogram which is past due.  You need a sleep study, you will be referred to neurology  Please commit to physical activity 5 days per week, gradually increased as discussed    Weight loss goal is 4 pounds per month  Please sched for your pap which is past due   TdAP today  Please get the flu vaccine today at the pharmacy

## 2012-05-11 NOTE — Progress Notes (Signed)
  Subjective:    Patient ID: Stacy [name], female    DOB: 11/13/1960, 52 y.o.   MRN: [id number]  HPI The PT is here for follow up and re-evaluation of chronic medical conditions, medication management and review of any available recent lab and radiology data.  Preventive health is updated, specifically  Cancer screening and Immunization. Mammogram and pelvic exams past due as are routine immunizations  Questions or concerns regarding consultations or procedures which the PT has had in the interim are  Addressed.Needs to return to endo, also missed rept thyroid US orddered The PT denies any adverse reactions to current medications since the last visit.  C/o increased fatigue , states she is told that she snores. C/o weight gain despite change in diet, still no regular exercise     Review of Systems See HPI Denies recent fever or chills. Denies sinus pressure, nasal congestion, ear pain or sore throat. Denies chest congestion, productive cough or wheezing. Denies chest pains, palpitations and leg swelling Denies abdominal pain, nausea, vomiting,diarrhea or constipation.   Denies dysuria, frequency, hesitancy or incontinence. Denies joint pain, swelling and limitation in mobility. Denies headaches, seizures, numbness, or tingling. Denies depression, anxiety or insomnia. Denies skin break down or rash.        Objective:   Physical Exam Patient alert and oriented and in no cardiopulmonary distress.  HEENT: No facial asymmetry, EOMI, no sinus tenderness,  oropharynx pink and moist.  Neck supple no adenopathy.  Chest: Clear to auscultation bilaterally.  CVS: S1, S2 no murmurs, no S3.  ABD: Soft non tender. Bowel sounds normal.  Ext: No edema  MS: Adequate ROM spine, shoulders, hips and knees.  Skin: Intact, no ulcerations or rash noted.  Psych: Good eye contact, normal affect. Memory intact not anxious or depressed appearing.  CNS: CN 2-12 intact, power, tone and  sensation normal throughout.        Assessment & Plan:

## 2012-05-11 NOTE — Assessment & Plan Note (Signed)
Need to sched appt with gyne who follows thi  Pt will make her own appt

## 2012-05-11 NOTE — Assessment & Plan Note (Signed)
Controlled, no change in medication  

## 2012-05-11 NOTE — Assessment & Plan Note (Signed)
rept [place] past due same re ordered and pt aware she needs this done

## 2012-05-11 NOTE — Assessment & Plan Note (Signed)
Hyperlipidemia:Low fat diet discussed and encouraged.  Updated lab needed 

## 2012-05-11 NOTE — Assessment & Plan Note (Signed)
Updated and lab and f/u with endo. Rept thyroid US past due will re order and send report to endo

## 2012-05-11 NOTE — Assessment & Plan Note (Signed)
Deteriorated. Patient re-educated about  the importance of commitment to a  minimum of 150 minutes of exercise per week. The importance of healthy food choices with portion control discussed. Encouraged to start a food diary, count calories and to consider  joining a support group. Sample diet sheets offered. Goals set by the patient for the next several months.    

## 2012-05-11 NOTE — Assessment & Plan Note (Signed)
Chronic fatigue worsening, h/o snoring, needs sleep study

## 2012-05-15 ENCOUNTER — Other Ambulatory Visit: Payer: Self-pay | Admitting: Family Medicine

## 2012-05-16 LAB — CBC WITH DIFFERENTIAL/PLATELET
Basophils Relative: 1 % (ref 0–1)
HCT: 32.5 % — ABNORMAL LOW (ref 36.0–46.0)
Hemoglobin: 11.1 g/dL — ABNORMAL LOW (ref 12.0–15.0)
Lymphocytes Relative: 46 % (ref 12–46)
Lymphs Abs: 1.8 10*3/uL (ref 0.7–4.0)
MCHC: 34.2 g/dL (ref 30.0–36.0)
Monocytes Absolute: 0.3 10*3/uL (ref 0.1–1.0)
Monocytes Relative: 9 % (ref 3–12)
Neutro Abs: 1.7 10*3/uL (ref 1.7–7.7)
Neutrophils Relative %: 42 % — ABNORMAL LOW (ref 43–77)
RBC: 3.82 MIL/uL — ABNORMAL LOW (ref 3.87–5.11)

## 2012-05-16 LAB — HEMOGLOBIN A1C: Mean Plasma Glucose: 123 mg/dL — ABNORMAL HIGH (ref <117)

## 2012-05-16 LAB — LIPID PANEL
Cholesterol: 166 mg/dL (ref 0–200)
Total CHOL/HDL Ratio: 4 Ratio
Triglycerides: 116 mg/dL (ref <150)
VLDL: 23 mg/dL (ref 0–40)

## 2012-05-16 LAB — VITAMIN B12: Vitamin B-12: 1425 pg/mL — ABNORMAL HIGH (ref 211–911)

## 2012-05-16 LAB — COMPREHENSIVE METABOLIC PANEL
AST: 24 U/L (ref 0–37)
BUN: 10 mg/dL (ref 6–23)
Calcium: 8.9 mg/dL (ref 8.4–10.5)
Chloride: 105 mEq/L (ref 96–112)
Creat: 0.94 mg/dL (ref 0.50–1.10)
Glucose, Bld: 75 mg/dL (ref 70–99)

## 2012-05-16 LAB — IRON: Iron: 64 ug/dL (ref 42–145)

## 2012-05-17 LAB — VITAMIN D 25 HYDROXY (VIT D DEFICIENCY, FRACTURES): Vit D, 25-Hydroxy: 32 ng/mL (ref 30–89)

## 2012-06-14 ENCOUNTER — Encounter: Payer: Self-pay | Admitting: "Endocrinology

## 2012-09-10 ENCOUNTER — Ambulatory Visit: Payer: Managed Care, Other (non HMO) | Admitting: Family Medicine

## 2012-09-24 ENCOUNTER — Ambulatory Visit (INDEPENDENT_AMBULATORY_CARE_PROVIDER_SITE_OTHER): Payer: Managed Care, Other (non HMO) | Admitting: Family Medicine

## 2012-09-24 ENCOUNTER — Encounter: Payer: Self-pay | Admitting: Family Medicine

## 2012-09-24 VITALS — BP 126/80 | HR 78 | Resp 18 | Ht 67.0 in | Wt 186.1 lb

## 2012-09-24 DIAGNOSIS — M5090 Cervical disc disorder, unspecified, unspecified cervical region: Secondary | ICD-10-CM

## 2012-09-24 DIAGNOSIS — Z131 Encounter for screening for diabetes mellitus: Secondary | ICD-10-CM

## 2012-09-24 DIAGNOSIS — M25561 Pain in right knee: Secondary | ICD-10-CM | POA: Insufficient documentation

## 2012-09-24 DIAGNOSIS — E663 Overweight: Secondary | ICD-10-CM

## 2012-09-24 DIAGNOSIS — K219 Gastro-esophageal reflux disease without esophagitis: Secondary | ICD-10-CM

## 2012-09-24 DIAGNOSIS — M25569 Pain in unspecified knee: Secondary | ICD-10-CM

## 2012-09-24 DIAGNOSIS — E039 Hypothyroidism, unspecified: Secondary | ICD-10-CM

## 2012-09-24 MED ORDER — CYCLOBENZAPRINE HCL 10 MG PO TABS: ORAL_TABLET | ORAL | Status: AC

## 2012-09-24 MED ORDER — CYCLOBENZAPRINE HCL 10 MG PO TABS: ORAL_TABLET | ORAL | Status: DC

## 2012-09-24 NOTE — Patient Instructions (Addendum)
F/u in 4 month  You are  Being referred for MRI of your neck and also to Dr Romeo Apple for evaluation of your right knee  Flexeril sent for neck spasm, use at bedtime only  Remove sugar it is toxic for people with weight and blood sugar issues   HBA1C due asap  Please let me know when your next thyroid test is done if it is good so that you can start phentermine as we have discussed

## 2012-09-24 NOTE — Assessment & Plan Note (Signed)
increased pain radiating to left elbow x  2 month, refer for MRI c spine, pt has weakened left hand grip Pt to continue anti inflammatory and tylenol, zannaflex added, and is referred for PT following c spine study

## 2012-09-24 NOTE — Assessment & Plan Note (Signed)
Increased pain with instability x 2 monht, ortho eval

## 2012-09-24 NOTE — Progress Notes (Signed)
  Subjective:    Patient ID: Stacy [name], female    DOB: [birth year], 52 y.o.   MRN: [id number]  HPI 2 month h/o left neck pain radiating down to left elbow ,  aggravated by sneezing, laughing and coughing, no permanent  relief with anti inflammatories, has had IM steroid still an issue, limits left shoulder mobility and grip of left hand , rated up to an 8. Some limitation of neck movement also  Due to pain. No associated injury noted to trigger the onset of pain. Increased right knee swelling, pain and instability x 2 month, she haaahd arthroscopy of the affected knee in 2003, now buckling , no recent injury a trigger. No consistent weight loss attempt, excessive sweet intake remains an issue, want to try phentermine if thyroid is well controlled, also needs to exercise   Review of Systems See HPI Denies recent fever or chills. Denies sinus pressure, nasal congestion, ear pain or sore throat. Denies chest congestion, productive cough or wheezing. Denies chest pains, palpitations and leg swelling Denies abdominal pain, nausea, vomiting,diarrhea or constipation.   Denies dysuria, frequency, hesitancy or incontinence. Denies headaches, seizures, numbness, or tingling. Denies depression, anxiety or insomnia. Denies skin break down or rash.        Objective:   Physical Exam  Patient alert and oriented and in no cardiopulmonary distress.  HEENT: No facial asymmetry, EOMI, no sinus tenderness,  oropharynx pink and moist.  Neck decreased ROM with left trapezius spasm, no adenopathy.  Chest: Clear to auscultation bilaterally.  CVS: S1, S2 no murmurs, no S3.  ABD: Soft non tender. Bowel sounds normal.  Ext: No edema  MS: Adequate ROM spine, hips and knees.Decreased ROM left shoulder, limited by pain.Decreased left hand grip due to pain from left neck  Skin: Intact, no ulcerations or rash noted.  Psych: Good eye contact, normal affect. Memory intact not anxious or depressed  appearing.  CNS: CN 2-12 intact, tone and sensation normal throughout.       Assessment & Plan:

## 2012-09-24 NOTE — Assessment & Plan Note (Signed)
Followed by endo, updated lab next month

## 2012-09-24 NOTE — Assessment & Plan Note (Signed)
Pt advised to take med regularly as she is taking inc anti inflammatories which cause a flare of her symptoms

## 2012-09-24 NOTE — Assessment & Plan Note (Signed)
Unchanged. Patient re-educated about  the importance of commitment to a  minimum of 150 minutes of exercise per week. The importance of healthy food choices with portion control discussed. Encouraged to start a food diary, count calories and to consider  joining a support group. Sample diet sheets offered. Goals set by the patient for the next several months.    

## 2012-09-27 ENCOUNTER — Telehealth: Payer: Self-pay | Admitting: Family Medicine

## 2012-09-27 ENCOUNTER — Ambulatory Visit (HOSPITAL_COMMUNITY)
Admission: RE | Admit: 2012-09-27 | Discharge: 2012-09-27 | Disposition: A | Discharge status: Home / Self Care | Payer: Managed Care, Other (non HMO) | Source: Ambulatory Visit | Attending: Family Medicine | Admitting: Family Medicine

## 2012-09-27 ENCOUNTER — Encounter (HOSPITAL_COMMUNITY): Payer: Self-pay

## 2012-09-27 DIAGNOSIS — M5380 Other specified dorsopathies, site unspecified: Secondary | ICD-10-CM | POA: Insufficient documentation

## 2012-09-27 DIAGNOSIS — M542 Cervicalgia: Secondary | ICD-10-CM | POA: Insufficient documentation

## 2012-09-27 DIAGNOSIS — M5020 Other cervical disc displacement, unspecified cervical region: Secondary | ICD-10-CM | POA: Insufficient documentation

## 2012-09-27 DIAGNOSIS — M5090 Cervical disc disorder, unspecified, unspecified cervical region: Secondary | ICD-10-CM

## 2012-09-30 ENCOUNTER — Other Ambulatory Visit: Payer: Self-pay | Admitting: Family Medicine

## 2012-09-30 DIAGNOSIS — M5090 Cervical disc disorder, unspecified, unspecified cervical region: Secondary | ICD-10-CM

## 2012-09-30 MED ORDER — GABAPENTIN 100 MG PO CAPS: ORAL_CAPSULE | ORAL | Status: DC

## 2012-09-30 MED ORDER — PREDNISONE (PAK) 5 MG PO TABS: 5.0000 mg | ORAL_TABLET | ORAL | Status: DC

## 2012-09-30 MED ORDER — IBUPROFEN 800 MG PO TABS: ORAL_TABLET | ORAL | Status: AC

## 2012-10-01 NOTE — Telephone Encounter (Signed)
Patient is aware 

## 2012-10-03 ENCOUNTER — Ambulatory Visit (INDEPENDENT_AMBULATORY_CARE_PROVIDER_SITE_OTHER): Payer: Managed Care, Other (non HMO) | Admitting: Orthopedic Surgery

## 2012-10-03 ENCOUNTER — Ambulatory Visit (INDEPENDENT_AMBULATORY_CARE_PROVIDER_SITE_OTHER): Payer: Managed Care, Other (non HMO)

## 2012-10-03 VITALS — BP 102/60 | Ht 66.0 in | Wt 186.0 lb

## 2012-10-03 DIAGNOSIS — M1710 Unilateral primary osteoarthritis, unspecified knee: Secondary | ICD-10-CM | POA: Insufficient documentation

## 2012-10-03 DIAGNOSIS — M25561 Pain in right knee: Secondary | ICD-10-CM | POA: Insufficient documentation

## 2012-10-03 DIAGNOSIS — M25569 Pain in unspecified knee: Secondary | ICD-10-CM

## 2012-10-03 MED ORDER — DICLOFENAC SODIUM 1 % TD GEL: 1.0000 "application " | Freq: Four times a day (QID) | TRANSDERMAL | Status: DC

## 2012-10-03 NOTE — Patient Instructions (Addendum)
voltaren gel Oral glucosamine Please call the hospital to START PHYSICAL THERAPY

## 2012-10-07 ENCOUNTER — Encounter: Payer: Self-pay | Admitting: Orthopedic Surgery

## 2012-10-07 NOTE — Progress Notes (Signed)
Patient ID: TRULY STANKIEWICZ, female   DOB: 02-14-1961, 52 y.o.   MRN: [id number] Chief Complaint  Patient presents with  . 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 She is status post lateral meniscectomy in 2003. She did try anti-inflammatories using Motrin but pain has persisted she has locking sensation. Pain comes and goes. Better with rest worse with standing and long periods of activity  Review of systems history of heartburn and tingling in the cervical spine otherwise normal  Surgical history of lateral meniscectomy same right knee. Gastroesophageal reflux endoscopy. History of hypothyroidism and gastritis. Her medications are Synthroid Entex LA and. Family history of heart disease and arthritis  She's married a Education officer, community does not smoke or drink.  BP 102/60  Ht 5\' 6"  (1.676 m)  Wt 186 lb (84.369 kg)  BMI 30.04 kg/m2 General appearance is normal, the patient is alert and oriented x3 with normal mood and affect. Mesomorphic body habitus. Ambulation is normal. Chest tenderness on the medial joint line on the lateral joint line she has no joint effusion full range of motion her knee is stable. Strength is normal skin is intact pulse and temperature normal sensation normal  X-ray show degenerative changes in the knee mild to moderate  Impression osteoarthritis of the knee recommend Voltaren gel 4 g 4 times a day secondary to gastroesophageal reflux. Physical therapy. Use anti-inflammatories cautiously.  Follow-up if no improvement for consideration of injection.

## 2012-10-29 ENCOUNTER — Ambulatory Visit (HOSPITAL_COMMUNITY)
Admission: RE | Admit: 2012-10-29 | Discharge: 2012-10-29 | Disposition: A | Discharge status: Home / Self Care | Payer: Managed Care, Other (non HMO) | Source: Ambulatory Visit | Attending: Physical Therapy | Admitting: Physical Therapy

## 2012-11-06 ENCOUNTER — Ambulatory Visit (HOSPITAL_COMMUNITY): Payer: Managed Care, Other (non HMO) | Admitting: Physical Therapy

## 2012-11-14 ENCOUNTER — Ambulatory Visit (HOSPITAL_COMMUNITY)
Admission: RE | Admit: 2012-11-14 | Discharge: 2012-11-14 | Disposition: A | Discharge status: Home / Self Care | Payer: Managed Care, Other (non HMO) | Source: Ambulatory Visit | Attending: Family Medicine | Admitting: Family Medicine

## 2012-11-14 DIAGNOSIS — Z5189 Encounter for other specified aftercare: Secondary | ICD-10-CM | POA: Insufficient documentation

## 2012-11-14 DIAGNOSIS — M542 Cervicalgia: Secondary | ICD-10-CM | POA: Insufficient documentation

## 2012-11-14 DIAGNOSIS — M62838 Other muscle spasm: Secondary | ICD-10-CM | POA: Insufficient documentation

## 2012-11-14 DIAGNOSIS — M5090 Cervical disc disorder, unspecified, unspecified cervical region: Secondary | ICD-10-CM

## 2012-11-14 DIAGNOSIS — M25569 Pain in unspecified knee: Secondary | ICD-10-CM | POA: Insufficient documentation

## 2012-11-14 NOTE — Evaluation (Signed)
Physical Therapy Evaluation  Patient Details  Name: AISHA GREENBERGER MRN: [id number] Date of Birth: [birth year]  Today's Date: 11/14/2012 Time: 1430-1506 PT Time Calculation (min): 36 min Charges: 1 eval TE: [id number]             Visit#: 1 of 8  Re-eval: 12/14/12 Assessment Diagnosis: Lt knee pain Next MD Visit: Dr. Romeo Apple - unscheduled  Past Medical History:  Past Medical History  Diagnosis Date  . GERD (gastroesophageal reflux disease)   . Onychomycosis     Left great toe  . Lactose intolerance   . Hashimoto thyroiditis   . Hyperlipidemia    Past Surgical History:  Past Surgical History  Procedure Laterality Date  . Dilation and curettage of uterus      Seconday to miscarriage  . Knee arthroscopy  2003    right knee  . Colonoscopy  04/04/2011    Internal hemorrhoids/Nl colonoscopy-SLIGHTLY TORTUOUS COLON  . Esophagogastroduodenoscopy  09/22/2011    Mild gastritis/Hiatal hernia/DYSPHAGIA DUE TO PEPTIC STRICTURE/UNCONTROLLED GERD  . Savory dilation  09/22/2011    Procedure: SAVORY DILATION;  Surgeon: West Bali, MD;  [facility]: [facility];  Service: Endoscopy;  Laterality: N/A;  Elease Hashimoto dilation  09/22/2011    Procedure: MALONEY DILATION;  Surgeon: West Bali, MD;  Location: [facility];  Service: Endoscopy;  Laterality: N/A;    Subjective Symptoms/Limitations Symptoms: Pt is a 52 year old female referred to PT for Pt reports that in 2003 she had a lateral menisectomy to her L knee and did not complete her PT.  She reports that over the past few months it has become progressivly worse.  She reports she has decreased her activity due to time constraints with her work. She reports that she has good days and bad days with her knee. Somedays it is swollen and other days it is not.  Denies difficulty with sit to stand activities.  Pertinent History: 2003 lateral menisectomy on her L knee.  Special Tests: Neck Pain Disability Index is 12, with ideal score being  0. Pain Assessment Currently in Pain?: Yes Pain Score: 8  Pain Location: Neck Pain Orientation: Left Pain Type: Acute pain  Precautions/Restrictions  Precautions Precautions: None Restrictions Weight Bearing Restrictions: No  Balance Screening Balance Screen Has the patient fallen in the past 6 months: No Has the patient had a decrease in activity level because of a fear of falling? : Yes Is the patient reluctant to leave their home because of a fear of falling? : No  Prior Functioning  Prior Function Driving: Yes Vocation: Full time employment Vocation Requirements: dentist Comments: enjoys spending time with her family  Cognition/Observation Cognition Orientation Level: Oriented X4 Observation/Other Assessments Observations: Bil pes planus   Sensation/Coordination/Flexibility/Functional Tests Sensation Light Touch: Appears Intact Coordination Gross Motor Movements are Fluid and Coordinated: Yes Fine Motor Movements are Fluid and Coordinated: Yes Flexibility 90/90: Positive  Assessment LUE AROM (degrees) LUE Overall AROM Comments: assessed in seatedER/IR with shoulder abducted Left Shoulder Flexion: 151 Degrees Left Shoulder ABduction: 150 Degrees Left Shoulder Internal Rotation: 90 Degrees Left Shoulder External Rotation: 65 Degrees LUE PROM (degrees) LUE Overall PROM Comments: assessed in supine, WFL LUE Strength LUE Overall Strength Comments: assessed in seated, ER/IR with shoulder adducted Left Shoulder Flexion: 4/5 Left Shoulder ABduction: 5/5 Left Shoulder Internal Rotation: 5/5 Left Shoulder External Rotation: 5/5 RLE Strength Right Hip Flexion: 3+/5 Right Hip Extension: 4/5 Right Hip ABduction: 3+/5 Right Hip ADduction: 3+/5 Right Knee Flexion: 5/5 Right  Knee Extension: 5/5 Cervical AROM Overall Cervical AROM Comments: assessed in seated Cervical Flexion: 5.5 cm Cervical Extension: 16.0 cm Cervical - Right Side Bend: 16.0 cm Cervical -  Left Side Bend: 15.0 cm Cervical - Right Rotation: 18.0 cm Cervical - Left Rotation: 16.0 cm Palpation Palpation: decreased Rt patellar mobility.  Increased pain and tenderness to medial hamstring.  (Simultaneous filing. User may not have seen previous data.)  Exercise/Treatments Stretches Active Hamstring Stretch: 1 rep;30 seconds Supine Straight Leg Raises: Right;10 reps Sidelying Hip ABduction: Right;10 reps Hip ADduction: Right;10 reps Prone  Hip Extension: 10 reps  Manual Therapy Manual Therapy: Myofascial release Myofascial Release: Manual cervical traction, manual left arm traction, MFR to SCM, trapezius, rhomboids, platysmus, and scapular region to decrease pain and fascial restrictions.   Physical Therapy Assessment and Plan PT Assessment and Plan Clinical Impression Statement: Pt is a 52 year old female referred to PT for OA of her Rt knee with impairments listed below. Pt will benefit from skilled therapeutic intervention in order to improve on the following deficits: Decreased range of motion;Decreased strength;Increased muscle spasms;Increased fascial restricitons;Pain Rehab Potential: Good PT Frequency: Min 2X/week PT Duration: 4 weeks (3 weeks) PT Treatment/Interventions: Gait training;Therapeutic activities;Therapeutic exercise;Neuromuscular re-education;Patient/family education;Functional mobility training PT Plan: Functional squats, heel and toe raises, Rt knee flexion standing.  Coninue with mat activities, progress towards stair training, balance training and rocker board.    Goals Home Exercise Program Pt/caregiver will Perform Home Exercise Program: Independently PT Goal: Perform Home Exercise Program - Progress: Met PT Short Term Goals Time to Complete Short Term Goals: 4 weeks PT Short Term Goal 1: Pt will report pain less than 80% of her day to her Rt knee for improved QOL.  PT Short Term Goal 2: Pt will report decreased edema to her Rt knee.  PT Short  Term Goal 3: Pt will improve her RLE strength by 1 muscle grade to decrease chance of pain.  PT Short Term Goal 4: Pt will present with normalized patellar mobility to decrease pain.  PT Short Term Goal 5: Pt will present with improved RLE hamstring length for decreased pain.   Problem List Patient Active Problem List   Diagnosis Date Noted  . Muscle spasms of head and/or neck 11/14/2012  . OA (osteoarthritis) of knee 10/03/2012  . Right knee pain 10/03/2012  . Cervical neck pain with evidence of disc disease 09/24/2012  . Pain in right knee 09/24/2012  . Thyroid nodule 05/09/2012  . Hypothyroidism 09/21/2011  . Ovarian cyst, left 04/27/2011  . ABNORMAL ELECTROCARDIOGRAM 03/04/2010  . CHEST PAIN UNSPECIFIED 12/03/2009  . ANEMIA 09/15/2009  . LEUKOPENIA, CHRONIC 09/15/2009  . FATIGUE 09/15/2009  . OVERWEIGHT 04/24/2009  . ONYCHOMYCOSIS, TOENAILS 05/23/2007  . DYSLIPIDEMIA 05/23/2007  . GERD 05/23/2007    General Behavior During Therapy: [facility] for tasks assessed/performed PT Plan of Care PT Home Exercise Plan: see scanned report PT Patient Instructions: importance of HEP, discussed POC, encouraged OTC orthotics,  answered questions about diagnosis, education and demonstration for patellar mobs.  Consulted and Agree with Plan of Care: Patient  GP    Annett Fabian, MPT, ATC 11/14/2012, 4:01 PM  Physician Documentation Your signature is required to indicate approval of the treatment plan as stated above.  Please sign and either send electronically or make a copy of this report for your files and return this physician signed original.   Please mark one 1.__approve of plan  2. ___approve of plan with the following conditions.   ______________________________  _____________________ Physician Signature                                                                                                             Date

## 2012-11-14 NOTE — Evaluation (Signed)
Occupational Therapy Evaluation  Patient Details  Name: Stacy [name] MRN: [id number] Date of Birth: 05/03/1960  Today's Date: 11/14/2012 Time: 8469-6295 OT Time Calculation (min): 40 min OT Evaluation 1355-1415 20' Manual Therapy 1415-1435 20' Visit#: 1 of 12  Re-eval: 12/12/12  Assessment Diagnosis: Cervical Neck Pain Prior Therapy: recieving PT for knee pain  Authorization: n/a  Authorization Time Period:    Authorization Visit#:   of     Past Medical History:  Past Medical History  Diagnosis Date  . GERD (gastroesophageal reflux disease)   . Onychomycosis     Left great toe  . Lactose intolerance   . Hashimoto thyroiditis   . Hyperlipidemia    Past Surgical History:  Past Surgical History  Procedure Laterality Date  . Dilation and curettage of uterus      Seconday to miscarriage  . Knee arthroscopy  2003    right knee  . Colonoscopy  04/04/2011    Internal hemorrhoids/Nl colonoscopy-SLIGHTLY TORTUOUS COLON  . Esophagogastroduodenoscopy  09/22/2011    Mild gastritis/Hiatal hernia/DYSPHAGIA DUE TO PEPTIC STRICTURE/UNCONTROLLED GERD  . Savory dilation  09/22/2011    Procedure: SAVORY DILATION;  Surgeon: West Bali, MD;  Location: [facility];  Service: Endoscopy;  Laterality: N/A;  Elease Hashimoto dilation  09/22/2011    Procedure: MALONEY DILATION;  Surgeon: West Bali, MD;  Location: [facility];  Service: Endoscopy;  Laterality: N/A;    Subjective S:  I dont hurt if I take the medication.  If I dont it can be like an 8/10. Pertinent History: Dr. Gerilyn Pilgrim consulted with her family MD and a neurosurgeon regarding pain she is experiencing in her posterior neck that radiates into her left arm and hand.  An MRI detected bulging discs.  She is receiving injections and has been referred to occupational therapy for evaluation and treatment.   Special Tests: Neck Pain Disability Index is 12, with ideal score being 0. Patient Stated Goals: I want to be pain  free Pain Assessment Currently in Pain?: Yes Pain Score: 8  Pain Location: Neck Pain Orientation: Left Pain Type: Acute pain  Precautions/Restrictions  Precautions Precautions: None Restrictions Weight Bearing Restrictions: No  Balance Screening Balance Screen Has the patient fallen in the past 6 months: No Has the patient had a decrease in activity level because of a fear of falling? : Yes Is the patient reluctant to leave their home because of a fear of falling? 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 Dominant Hand: Right  Cognition/Observation Cognition Orientation Level: Oriented X4 Observation/Other Assessments Observations: Bil pes planus   Sensation/Coordination/Edema Sensation Light Touch: Appears Intact Coordination Gross Motor Movements are Fluid and Coordinated: Yes Fine Motor Movements are Fluid and Coordinated: Yes  Additional Assessments LUE AROM (degrees) LUE Overall AROM Comments: assessed in seatedER/IR with shoulder abducted Left Shoulder Flexion: 151 Degrees Left Shoulder ABduction: 150 Degrees Left Shoulder Internal Rotation: 90 Degrees Left Shoulder External Rotation: 65 Degrees LUE PROM (degrees) LUE Overall PROM Comments: assessed in supine, WFL LUE Strength LUE Overall Strength Comments: assessed in seated, ER/IR with shoulder adducted Left Shoulder Flexion: 4/5 Left Shoulder ABduction: 5/5 Left Shoulder Internal Rotation: 5/5 Left Shoulder External Rotation: 5/5 Cervical AROM Overall Cervical AROM  Comments: assessed in seated Cervical Flexion: 5.5 cm Cervical Extension: 16.0 cm Cervical - Right Side Bend: 16.0 cm Cervical -  Left Side Bend: 15.0 cm Cervical - Right Rotation: 18.0 cm Cervical - Left Rotation: 16.0 cm Palpation Palpation: decreased Rt patellar mobility.  Increased pain and tenderness to medial hamstring.  (Simultaneous filing. User may not have seen previous data.)     Exercise/Treatments    Manual Therapy Manual Therapy: Myofascial release Myofascial Release: Manual cervical traction, manual left arm traction, MFR to SCM, trapezius, rhomboids, platysmus, and scapular region to decrease pain and fascial restrictions.   Occupational Therapy Assessment and Plan OT Assessment and Plan Clinical Impression Statement: A:  Patient presents with decreased neck and left shoulder mobiity due to increased pain and fascial restrictions, causing decreased I with all B/IADLs, work, and leisure activities.  Pt will benefit from skilled therapeutic intervention in order to improve on the following deficits: Decreased range of motion;Decreased strength;Increased muscle spasms;Increased fascial restricitons;Pain Rehab Potential: Excellent OT Frequency: Min 2X/week OT Duration: 6 weeks OT Treatment/Interventions: Therapeutic exercise;Manual therapy;Therapeutic activities;Modalities;Patient/family education;Other (comment) (mechanical traction) OT Plan: P:  Skilled OT intervention to improve AROM and strength and decrease pain and fascial restrictions in cervical region and left shoulder, allowing for full I with all B/IADLs, work, and leisure activities.  Treatment Plan:  MFR and manual stretching, manual cervical traction (if no improvements after 4 visits, add mechanical traction), scapular stability exercises, shoulder and neck AROM and stretches, progress as tolerated.    Goals Short Term Goals Time to Complete Short Term Goals: 3 weeks Short Term Goal 1: Patient will be educated on a HEP. Short Term Goal 2: Patient will decrease pain to 5/10 when working. Short Term Goal 3: Patient will improve left shoulder  AROM to WNL for increased ability to lift items into overhead cabinet. Short Term Goal 4: Patient will improve scapular stability and posture from fair to good. Short Term Goal 5: Patient will improve cervical AROM to [facility] for increased safety when driving. Additional Short Term Goals?: Yes Short Term Goal 6: Patient will have min-mod fascial restrictions in her left shoulder region. Long Term Goals Time to Complete Long Term Goals: 6 weeks Long Term Goal 1: Patient will return to prior level of independence with all B/IADLs, work, and leisure activities.  Long Term Goal 2: Patient will improve left shoulder flexion strength to 5/5 for increased abiilty to lift items into overhead cabinets.  Long Term Goal 3: Patient will improve scapular stability and posture to normal. Long Term Goal 4: Patient will decrease pain in her neck to 2/10 when working. Long Term Goal 5: Patient will have minimal fascial restrictions in her left scapular region.  Additional Long Term Goals?: Yes Long Term Goal 6: Patient will have WNL cervical AROM.   Problem List Patient Active Problem List   Diagnosis Date Noted  . Muscle spasms of head and/or neck 11/14/2012  . OA (osteoarthritis) of knee 10/03/2012  . Right knee pain 10/03/2012  . Cervical neck pain with evidence of disc disease 09/24/2012  . Pain in right knee 09/24/2012  . Thyroid nodule 05/09/2012  . Hypothyroidism 09/21/2011  . Ovarian cyst, left 04/27/2011  . ABNORMAL ELECTROCARDIOGRAM 03/04/2010  . CHEST PAIN UNSPECIFIED 12/03/2009  . ANEMIA 09/15/2009  . LEUKOPENIA, CHRONIC 09/15/2009  . FATIGUE 09/15/2009  . OVERWEIGHT 04/24/2009  . ONYCHOMYCOSIS, TOENAILS 05/23/2007  . DYSLIPIDEMIA 05/23/2007  . GERD 05/23/2007    End of Session Activity Tolerance: Patient tolerated treatment  well General Behavior During Therapy: WFL for tasks assessed/performed OT Plan of Care OT Home Exercise Plan: scanned cervical stretches and AROM, shoulder  stretches.  Consulted and Agree with Plan of Care: Patient  GO    Shirlean Mylar, OTR/L  11/14/2012, 3:53 PM  Physician Documentation Your signature is required to indicate approval of the treatment plan as stated above.  Please sign and either send electronically or make a copy of this report for your files and return this physician signed original.  Please mark one 1.__approve of plan  2. ___approve of plan with the following conditions.   ______________________________                                                          _____________________ Physician Signature                                                                                                             Date

## 2012-12-05 ENCOUNTER — Ambulatory Visit (HOSPITAL_COMMUNITY)
Admission: RE | Admit: 2012-12-05 | Discharge: 2012-12-05 | Disposition: A | Discharge status: Home / Self Care | Payer: Managed Care, Other (non HMO) | Source: Ambulatory Visit | Attending: Family Medicine | Admitting: Family Medicine

## 2012-12-05 DIAGNOSIS — M25529 Pain in unspecified elbow: Secondary | ICD-10-CM | POA: Insufficient documentation

## 2012-12-05 DIAGNOSIS — M542 Cervicalgia: Secondary | ICD-10-CM | POA: Insufficient documentation

## 2012-12-05 DIAGNOSIS — Z5189 Encounter for other specified aftercare: Secondary | ICD-10-CM | POA: Insufficient documentation

## 2012-12-05 DIAGNOSIS — M1710 Unilateral primary osteoarthritis, unspecified knee: Secondary | ICD-10-CM | POA: Insufficient documentation

## 2012-12-05 DIAGNOSIS — M6289 Other specified disorders of muscle: Secondary | ICD-10-CM | POA: Insufficient documentation

## 2012-12-05 DIAGNOSIS — M25569 Pain in unspecified knee: Secondary | ICD-10-CM | POA: Insufficient documentation

## 2012-12-05 DIAGNOSIS — M62838 Other muscle spasm: Secondary | ICD-10-CM | POA: Insufficient documentation

## 2012-12-05 NOTE — Progress Notes (Signed)
Physical Therapy Treatment Patient Details  Name: Stacy [name] MRN: [id number] Date of Birth: [birth year]  Today's Date: 12/05/2012 Time: 4098-1191 PT Time Calculation (min): 38 min Visit#: 2 of 8  Re-eval: 12/14/12 Charges:  therex 38'  Subjective: Symptoms/Limitations Symptoms: Pt states her knee is feeling good today, a little soreness but not alot of pain.  States she has not done her HEP as she should but she has been walking. Pain Assessment Currently in Pain?: Yes Pain Score: 3  Pain Location: Elbow Pain Orientation: Left Pain Type: Acute pain   Exercise/Treatments Aerobic Stationary Bike: nustep 10' level 2 hills 3 Standing Heel Raises: 10 reps;Limitations Heel Raises Limitations: toeraises 10 reps Knee Flexion: 10 reps;Right Supine Short Arc Quad Sets: 10 reps Bridges: 10 reps Straight Leg Raises: Right;10 reps Sidelying Hip ABduction: Right;10 reps Hip ADduction: Right;10 reps Prone  Hamstring Curl: 10 reps Hip Extension: 10 reps;Right   Manual Therapy Manual Therapy: Myofascial release Myofascial Release: Manual cervical traction, manual left arm traction, MFR to SCM, trapezius, rhomboids, platysmus, and scapular region to decrease pain and fascial restrictions.  Physical Therapy Assessment and Plan PT Assessment and Plan Clinical Impression Statement: Pt admits to not doing HEP as she should, however states she is walking on her treadmill at home for 10 minutes at a medium pace.  Added nustep and standing exercises to POC without difficulty.  Pt  requires vc's to perform exercises slowly, especially in eccentric phase.  PT Plan: Continue with mat activities increasing reps/weights.  Progress towards stair training, balance training and rocker board.     Problem List Patient Active Problem List   Diagnosis Date Noted  . Muscle spasms of head and/or neck 11/14/2012  . OA (osteoarthritis) of knee 10/03/2012  . Right knee pain 10/03/2012  . Cervical  neck pain with evidence of disc disease 09/24/2012  . Pain in right knee 09/24/2012  . Thyroid nodule 05/09/2012  . Hypothyroidism 09/21/2011  . Ovarian cyst, left 04/27/2011  . ABNORMAL ELECTROCARDIOGRAM 03/04/2010  . CHEST PAIN UNSPECIFIED 12/03/2009  . ANEMIA 09/15/2009  . LEUKOPENIA, CHRONIC 09/15/2009  . FATIGUE 09/15/2009  . OVERWEIGHT 04/24/2009  . ONYCHOMYCOSIS, TOENAILS 05/23/2007  . DYSLIPIDEMIA 05/23/2007  . GERD 05/23/2007     Lurena Nida, PTA/CLT 12/05/2012, 2:24 PM

## 2012-12-05 NOTE — Progress Notes (Signed)
Occupational Therapy Treatment Patient Details  Name: Stacy [name] MRN: [id number] Date of Birth: December 31, 1960  Today's Date: 12/05/2012 Time: 7829-5621 OT Time Calculation (min): 41 min Manual Therapy 3086-5784 31' Self Care 1335-1345 10' Visit#: 2 of 12  Re-eval: 12/12/12     Subjective S:  I didnt take any medicine, most of the pain is in my elbow today.   Pain Assessment Currently in Pain?: Yes Pain Score: 3  Pain Location: Elbow Pain Orientation: Left Pain Type: Acute pain  Precautions/Restrictions    progress as tolerated  Exercise/Treatments Standing Extension: Theraband;10 reps Theraband Level (Shoulder Extension): Level 2 (Red) Row: Theraband;10 reps Theraband Level (Shoulder Row): Level 2 (Red) Retraction: Theraband;10 reps Theraband Level (Shoulder Retraction): Level 2 (Red)      Manual Therapy Manual Therapy: Myofascial release Myofascial Release: Manual cervical traction, manual left arm traction, MFR to SCM, trapezius, rhomboids, platysmus, and scapular region to decrease pain and fascial restrictions. Activities of Daily Living Activities of Daily Living: Discussed ergonomics at work and ability to avoid overreaching and promoted keeping arms close to body when working.  Discussed layout of building/dental suites to accomodate improved ergonomics.   Occupational Therapy Assessment and Plan OT Assessment and Plan Clinical Impression Statement: A:  Good vasomotor response from MFR and cervical/LUE traction this date.  Patient educated on theraband exercises for home for increased posture.  Patient requires vg for depressing shoulder blades during therapeutic exercises. OT Plan: P:  Add additional scapular stability exercises, decrease fascial restrictions from mod to min.   Goals Short Term Goals Time to Complete Short Term Goals: 3 weeks Short Term Goal 1: Patient will be educated on a HEP. Short Term Goal 1 Progress: Progressing toward goal Short  Term Goal 2: Patient will decrease pain to 5/10 when working. Short Term Goal 2 Progress: Progressing toward goal Short Term Goal 3: Patient will improve left shoulder AROM to WNL for increased ability to lift items into overhead cabinet. Short Term Goal 3 Progress: Progressing toward goal Short Term Goal 4: Patient will improve scapular stability and posture from fair to good. Short Term Goal 4 Progress: Progressing toward goal Short Term Goal 5: Patient will improve cervical AROM to [facility] for increased safety when driving. Short Term Goal 5 Progress: Progressing toward goal Additional Short Term Goals?: Yes Short Term Goal 6: Patient will have min-mod fascial restrictions in her left shoulder region. Short Term Goal 6 Progress: Progressing toward goal Long Term Goals Time to Complete Long Term Goals: 6 weeks Long Term Goal 1: Patient will return to prior level of independence with all B/IADLs, work, and leisure activities.  Long Term Goal 1 Progress: Progressing toward goal Long Term Goal 2: Patient will improve left shoulder flexion strength to 5/5 for increased abiilty to lift items into overhead cabinets.  Long Term Goal 2 Progress: Progressing toward goal Long Term Goal 3: Patient will improve scapular stability and posture to normal. Long Term Goal 3 Progress: Progressing toward goal Long Term Goal 4: Patient will decrease pain in her neck to 2/10 when working. Long Term Goal 4 Progress: Progressing toward goal Long Term Goal 5: Patient will have minimal fascial restrictions in her left scapular region.  Long Term Goal 5 Progress: Progressing toward goal Additional Long Term Goals?: Yes Long Term Goal 6: Patient will have WNL cervical AROM.  Long Term Goal 6 Progress: Progressing toward goal  Problem List Patient Active Problem List   Diagnosis Date Noted  . Muscle spasms of head  and/or neck 11/14/2012  . OA (osteoarthritis) of knee 10/03/2012  . Right knee pain 10/03/2012  .  Cervical neck pain with evidence of disc disease 09/24/2012  . Pain in right knee 09/24/2012  . Thyroid nodule 05/09/2012  . Hypothyroidism 09/21/2011  . Ovarian cyst, left 04/27/2011  . ABNORMAL ELECTROCARDIOGRAM 03/04/2010  . CHEST PAIN UNSPECIFIED 12/03/2009  . ANEMIA 09/15/2009  . LEUKOPENIA, CHRONIC 09/15/2009  . FATIGUE 09/15/2009  . OVERWEIGHT 04/24/2009  . ONYCHOMYCOSIS, TOENAILS 05/23/2007  . DYSLIPIDEMIA 05/23/2007  . GERD 05/23/2007    End of Session Activity Tolerance: Patient tolerated treatment well General Behavior During Therapy: WFL for tasks assessed/performed OT Plan of Care OT Home Exercise Plan: Educated and issued postural 3 theraband exercises with red theraband.  Consulted and Agree with Plan of Care: Patient  GO    Shirlean Mylar, OTR/L  12/05/2012, 2:09 PM

## 2012-12-06 ENCOUNTER — Ambulatory Visit (HOSPITAL_COMMUNITY): Payer: Managed Care, Other (non HMO) | Admitting: Specialist

## 2012-12-06 ENCOUNTER — Ambulatory Visit (HOSPITAL_COMMUNITY)
Admission: RE | Admit: 2012-12-06 | Discharge: 2012-12-06 | Disposition: A | Discharge status: Home / Self Care | Payer: Managed Care, Other (non HMO) | Source: Ambulatory Visit | Attending: Family Medicine | Admitting: Family Medicine

## 2012-12-06 NOTE — Progress Notes (Signed)
Physical Therapy Treatment Patient Details  Name: Stacy [name] MRN: [id number] Date of Birth: 12-07-1960  Today's Date: 12/06/2012 Time: 1352-1430 PT Time Calculation (min): 38 min Charge:TE 1352-1430  Visit#: 3 of 8  Re-eval: 12/14/12 Assessment Diagnosis: Lt knee pain Next MD Visit: Dr. Romeo Apple - unscheduled  Authorization:    Authorization Time Period:    Authorization Visit#:   of     Subjective: Symptoms/Limitations Symptoms: Pt stated Rt knee is feeling good today, pt has been trying to walk more.  Brought in her sneakers for therpay today.   Pain Assessment Currently in Pain?: No/denies  Objective:   Exercise/Treatments Aerobic Stationary Bike: 8' @ 5.0 resume Nustep next session Standing Heel Raises: 15 reps;Limitations Heel Raises Limitations: toeraises 10 reps Knee Flexion: 10 reps;Right Functional Squat: 10 reps Rocker Board: 2 minutes;Limitations Rocker Board Limitations: R/L Supine Bridges: 10 reps;Limitations Bridges Limitations: with ball between knees Straight Leg Raises: Right;15 reps Sidelying Hip ABduction: Right;15 reps Hip ADduction: Right;15 reps Prone  Hamstring Curl: 15 reps Hip Extension: 15 reps;Limitations Hip Extension Limitations: 2#      Physical Therapy Assessment and Plan PT Assessment and Plan Clinical Impression Statement: Pt able to complete therex with minimal difficulty. Added rockerboard to POC to improve weight distribution with gait mechanics and balance. Able to increase reps with mat activities with min cueing required to control movements and positioning for correct muscle activation. Pt encouraged to increase frequencywith HEP for maximum benefits. PT Plan: Continue with mat activities increasing reps/weights.  Progress towards stair and balance training.    Goals    Problem List Patient Active Problem List   Diagnosis Date Noted  . Muscle spasms of head and/or neck 11/14/2012  . OA (osteoarthritis) of  knee 10/03/2012  . Right knee pain 10/03/2012  . Cervical neck pain with evidence of disc disease 09/24/2012  . Pain in right knee 09/24/2012  . Thyroid nodule 05/09/2012  . Hypothyroidism 09/21/2011  . Ovarian cyst, left 04/27/2011  . ABNORMAL ELECTROCARDIOGRAM 03/04/2010  . CHEST PAIN UNSPECIFIED 12/03/2009  . ANEMIA 09/15/2009  . LEUKOPENIA, CHRONIC 09/15/2009  . FATIGUE 09/15/2009  . OVERWEIGHT 04/24/2009  . ONYCHOMYCOSIS, TOENAILS 05/23/2007  . DYSLIPIDEMIA 05/23/2007  . GERD 05/23/2007    PT - End of Session Activity Tolerance: Patient tolerated treatment well General Behavior During Therapy: [facility] for tasks assessed/performed  GP    Juel Burrow 12/06/2012, 7:38 PM

## 2012-12-10 ENCOUNTER — Ambulatory Visit (HOSPITAL_COMMUNITY)
Admission: RE | Admit: 2012-12-10 | Discharge: 2012-12-10 | Disposition: A | Discharge status: Home / Self Care | Payer: Managed Care, Other (non HMO) | Source: Ambulatory Visit | Attending: Family Medicine | Admitting: Family Medicine

## 2012-12-10 DIAGNOSIS — M62838 Other muscle spasm: Secondary | ICD-10-CM

## 2012-12-10 NOTE — Progress Notes (Signed)
Physical Therapy Treatment Patient Details  Name: Stacy [name] MRN: [id number] Date of Birth: 1960/12/16  Today's Date: 12/10/2012 Time: 4098-1191 PT Time Calculation (min): 45 min Charges:  TE: 1350-1410 Manual: 1410-1425 [place]: 1425-1433 Visit#: 4 of 8  Re-eval: 12/14/12   Subjective: Pain Assessment Pain Score: 3  Pain Location: Neck Pain Orientation: Left;Right Pain Type: Acute pain  Precautions/Restrictions     Exercise/Treatments Standing Knee Flexion: 10 reps;Right;Limitations Knee Flexion Limitations: 4# Lateral Step Up: Right;10 reps Forward Step Up: Right;10 reps Functional Squat: 15 reps SLS with Vectors: 5 sec holds 3 reps 3 directions   Modalities Modalities: Ultrasound Manual Therapy Manual Therapy: Myofascial release Myofascial Release: to Rt lateral distal quadricep and patella region adn throughout quadrcipe to decresae fascial restricitons.  Ultrasound Ultrasound Location: Rt lateral knee Ultrasound Parameters: 8 minutes, 0.8 w/cm 2, 3 mHz  Ultrasound Goals: Pain  Physical Therapy Assessment and Plan PT Assessment and Plan Clinical Impression Statement: Added [place] at end of treatment to decrease fascial restrictions.  Educated pt on purchase of home massage tools including the stick and theracane for pain relief.  At this time pt likely continues to have knee pain due to quadriceps dysfunction.  Encouraged to continue with exercises to improve strength and function.  PT Plan: Continue with LE strengthening activities and f/u on [place] use.  Add hip abduction, clams and hip extension next visit.     Goals    Problem List Patient Active Problem List   Diagnosis Date Noted  . Muscle spasms of head and/or neck 11/14/2012  . OA (osteoarthritis) of knee 10/03/2012  . Right knee pain 10/03/2012  . Cervical neck pain with evidence of disc disease 09/24/2012  . Pain in right knee 09/24/2012  . Thyroid nodule 05/09/2012  . Hypothyroidism 09/21/2011  .  Ovarian cyst, left 04/27/2011  . ABNORMAL ELECTROCARDIOGRAM 03/04/2010  . CHEST PAIN UNSPECIFIED 12/03/2009  . ANEMIA 09/15/2009  . LEUKOPENIA, CHRONIC 09/15/2009  . FATIGUE 09/15/2009  . OVERWEIGHT 04/24/2009  . ONYCHOMYCOSIS, TOENAILS 05/23/2007  . DYSLIPIDEMIA 05/23/2007  . GERD 05/23/2007    PT - End of Session Activity Tolerance: Patient tolerated treatment well General Behavior During Therapy: [facility] for tasks assessed/performed PT Plan of Care PT Patient Instructions: discussed self massage devices.  Consulted and Agree with Plan of Care: Patient  GP    Lavanda Nevels, MPT, ATC 12/10/2012, 4:40 PM

## 2012-12-10 NOTE — Progress Notes (Signed)
Occupational Therapy Treatment Patient Details  Name: Stacy [name] MRN: [id number] Date of Birth: 01/26/1961  Today's Date: 12/10/2012 Time: 4098-1191 OT Time Calculation (min): 41 min Manual Therapy 1437-1500 23' Therapeutic exercises 1500-1518 18' Visit#: 3 of 12  Re-eval: 12/12/12    Subjective S:  I am still having a lot of pain, and I think it is because I am exercising more, it is a good pain.  Pain Assessment Pain Score: 3  Pain Location: Neck Pain Orientation: Left;Right Pain Type: Acute pain  Precautions/Restrictions   progress as tolerated  Exercise/Treatments Standing External Rotation: Theraband;10 reps Theraband Level (Shoulder External Rotation): Level 2 (Red) Internal Rotation: Theraband;10 reps Theraband Level (Shoulder Internal Rotation): Level 2 (Red) Extension: Theraband;10 reps Theraband Level (Shoulder Extension): Level 2 (Red) Row: Theraband;10 reps Theraband Level (Shoulder Row): Level 2 (Red) Retraction: Theraband;10 reps Theraband Level (Shoulder Retraction): Level 2 (Red) ROM / Strengthening / Isometric Strengthening UBE (Upper Arm Bike): 3' in reverse at 1.5   Stretches Other Shoulder Stretches: completed 10" stretch of cervical stretch, upper trapezius stretch, mid thoracic stretch, and levator scapulae stretch to right and left     Manual Therapy Manual Therapy: Myofascial release Myofascial Release: Manual cervical traction, manual left arm traction, MFR to SCM, trapezius, rhomboids, platysmus, and scapular region to decrease pain and fascial restrictions.  Sub occipital release.  Occupational Therapy Assessment and Plan OT Assessment and Plan Clinical Impression Statement: A:  less restrictions noted in cervical region this date, SCM remains restricted.  Added cervical stretches and UBE in reverse.  OT Plan: P: Reassess.   Goals Short Term Goals Time to Complete Short Term Goals: 3 weeks Short Term Goal 1: Patient will be  educated on a HEP. Short Term Goal 1 Progress: Progressing toward goal Short Term Goal 2: Patient will decrease pain to 5/10 when working. Short Term Goal 2 Progress: Progressing toward goal Short Term Goal 3: Patient will improve left shoulder AROM to WNL for increased ability to lift items into overhead cabinet. Short Term Goal 3 Progress: Progressing toward goal Short Term Goal 4: Patient will improve scapular stability and posture from fair to good. Short Term Goal 4 Progress: Progressing toward goal Short Term Goal 5: Patient will improve cervical AROM to [facility] for increased safety when driving. Short Term Goal 5 Progress: Progressing toward goal Additional Short Term Goals?: Yes Short Term Goal 6: Patient will have min-mod fascial restrictions in her left shoulder region. Short Term Goal 6 Progress: Progressing toward goal Long Term Goals Time to Complete Long Term Goals: 6 weeks Long Term Goal 1: Patient will return to prior level of independence with all B/IADLs, work, and leisure activities.  Long Term Goal 1 Progress: Progressing toward goal Long Term Goal 2: Patient will improve left shoulder flexion strength to 5/5 for increased abiilty to lift items into overhead cabinets.  Long Term Goal 2 Progress: Progressing toward goal Long Term Goal 3: Patient will improve scapular stability and posture to normal. Long Term Goal 3 Progress: Progressing toward goal Long Term Goal 4: Patient will decrease pain in her neck to 2/10 when working. Long Term Goal 4 Progress: Progressing toward goal Long Term Goal 5: Patient will have minimal fascial restrictions in her left scapular region.  Long Term Goal 5 Progress: Progressing toward goal Additional Long Term Goals?: Yes Long Term Goal 6: Patient will have WNL cervical AROM.  Long Term Goal 6 Progress: Progressing toward goal  Problem List Patient Active Problem List  Diagnosis Date Noted  . Muscle spasms of head and/or neck 11/14/2012   . OA (osteoarthritis) of knee 10/03/2012  . Right knee pain 10/03/2012  . Cervical neck pain with evidence of disc disease 09/24/2012  . Pain in right knee 09/24/2012  . Thyroid nodule 05/09/2012  . Hypothyroidism 09/21/2011  . Ovarian cyst, left 04/27/2011  . ABNORMAL ELECTROCARDIOGRAM 03/04/2010  . CHEST PAIN UNSPECIFIED 12/03/2009  . ANEMIA 09/15/2009  . LEUKOPENIA, CHRONIC 09/15/2009  . FATIGUE 09/15/2009  . OVERWEIGHT 04/24/2009  . ONYCHOMYCOSIS, TOENAILS 05/23/2007  . DYSLIPIDEMIA 05/23/2007  . GERD 05/23/2007    End of Session Activity Tolerance: Patient tolerated treatment well General Behavior During Therapy: [facility] for tasks assessed/performed OT Plan of Care OT Home Exercise Plan: Educated on cervical stretches  Consulted and Agree with Plan of Care: Patient  GO    Shirlean Mylar, OTR/L  12/10/2012, 4:35 PM

## 2012-12-12 ENCOUNTER — Ambulatory Visit (HOSPITAL_COMMUNITY)
Admission: RE | Admit: 2012-12-12 | Discharge: 2012-12-12 | Disposition: A | Discharge status: Home / Self Care | Payer: Managed Care, Other (non HMO) | Source: Ambulatory Visit | Attending: Family Medicine | Admitting: Family Medicine

## 2012-12-12 NOTE — Progress Notes (Signed)
Physical Therapy Treatment Patient Details  Name: Stacy [name] MRN: [id number] Date of Birth: 02/25/1961  Today's Date: 12/12/2012 Time: 7829-5621 PT Time Calculation (min): 52 min Charge: [place] 8 1425-1433, Manual [id number], Gait training 1350-1358, TE [id number]  Visit#: 5 of 8  Re-eval: 12/14/12 Assessment Diagnosis: Lt knee pain Next MD Visit: Dr. Romeo Apple - unscheduled  Subjective: Symptoms/Limitations Symptoms: Pt stated US did help with pain last session.  Pt reports increased pain today, has taken pain meds prior therapy today.  Pt did bring in her sneakers for therapy today. Pain Assessment Currently in Pain?: Yes Pain Score: 3  Pain Location: Knee Pain Orientation: Right  Precautions/Restrictions  Precautions Precautions: None  Exercise/Treatments Aerobic Stationary Bike: Nustep hill level 3 resistnace 2 x 8 minutes  Standing Lateral Step Up: Right;10 reps Forward Step Up: Right;10 reps Stairs: 1RT reciprocal Gait Training: Focus on heel strike, equal stride length and cueing for hip rotation and Rt knee flexion Sidelying Hip ABduction: Right;15 reps Clams: 5x 10" Bil LE with tactile cueing Prone  Hip Extension: 15 reps   Modalities Modalities: Ultrasound Manual Therapy Manual Therapy: Myofascial release Myofascial Release: to Rt lateral distal quadricep and patella region and throughout quadrcipe to decresae fascial restricitons. Ultrasound Ultrasound Location: Rt lateral knee  Ultrasound Parameters: 0.8 w/cm2 pulsed x 8 minutes Ultrasound Goals: Pain  Physical Therapy Assessment and Plan PT Assessment and Plan Clinical Impression Statement: Session focus on improving gait mechanics to reduce risk of further injury and to reduce pain with gait, pt with tendency to leave Rt hip behind with Rt heel strike.  Improved gait mechanics followoing cueing with decreased antalgic gait mechanics.  Began reciprocal stair trainng to improve mechanics, cueing  required to reduce hip dropping with Lt LE step down.  Continued [place] and manual techniques to decrease fascial restrictions and pain. PT Plan: Continue with LE strengthening activities and f/u on [place] use.      Goals    Problem List Patient Active Problem List   Diagnosis Date Noted  . Muscle spasms of head and/or neck 11/14/2012  . OA (osteoarthritis) of knee 10/03/2012  . Right knee pain 10/03/2012  . Cervical neck pain with evidence of disc disease 09/24/2012  . Pain in right knee 09/24/2012  . Thyroid nodule 05/09/2012  . Hypothyroidism 09/21/2011  . Ovarian cyst, left 04/27/2011  . ABNORMAL ELECTROCARDIOGRAM 03/04/2010  . CHEST PAIN UNSPECIFIED 12/03/2009  . ANEMIA 09/15/2009  . LEUKOPENIA, CHRONIC 09/15/2009  . FATIGUE 09/15/2009  . OVERWEIGHT 04/24/2009  . ONYCHOMYCOSIS, TOENAILS 05/23/2007  . DYSLIPIDEMIA 05/23/2007  . GERD 05/23/2007    PT - End of Session Activity Tolerance: Patient tolerated treatment well General Behavior During Therapy: [facility] for tasks assessed/performed  GP    Juel Burrow 12/12/2012, 2:54 PM

## 2012-12-17 ENCOUNTER — Ambulatory Visit (HOSPITAL_COMMUNITY)
Admission: RE | Admit: 2012-12-17 | Discharge: 2012-12-17 | Disposition: A | Discharge status: Home / Self Care | Payer: Managed Care, Other (non HMO) | Source: Ambulatory Visit | Attending: Family Medicine | Admitting: Family Medicine

## 2012-12-17 NOTE — Progress Notes (Signed)
Physical Therapy Discharge Summary/treatment  Patient Details  Name: Stacy [name] MRN: [id number] Date of Birth: 1960/09/03  Today's Date: 12/17/2012 Time: 4696-2952 PT Time Calculation (min): 38 min Charge: TE 1348-1410, MMT 1410-1415, Self care [phone]              Visit#: 6 of 8  Re-eval:   Assessment Diagnosis: Lt knee pain Next MD Visit: Dr. Romeo Apple - unscheduled  Subjective Symptoms/Limitations Symptoms: Pt reported knee is feeling good today, maybe a 1/10 pain scale for Rt knee.  Pt stated she has been focusing on her knee bending and walking alot. Pain Assessment Currently in Pain?: Yes Pain Score: 1  Pain Location: Knee Pain Orientation: Right  Precautions/Restrictions  Precautions Precautions: None  Sensation/Coordination/Flexibility/Functional Tests Flexibility 90/90: Negative (160 degrees)  Assessment RLE Strength Right Hip Flexion: 4/5 (was 3+/5) Right Hip Extension: 5/5 (was 4/5) Right Hip ABduction: 4/5 (was 3+/5) Right Hip ADduction: 5/5 (was 3+/5) Right Knee Flexion: 5/5 (was 5/5) Right Knee Extension: 5/5 (was 5/5) Palpation Palpation: normalized patella mobility  Exercise/Treatments Aerobic Stationary Bike: Nustep hill level 3 resistnace 2 x 10 minutes  Standing Lateral Step Up: Right;10 reps;Hand Hold: 1;Step Height: 2" Forward Step Up: Right;20 reps;Step Height: 6" Step Down: Right;10 reps;Hand Hold: 1;Step Height: 4" Functional Squat: 15 reps    Physical Therapy Assessment and Plan PT Assessment and Plan Clinical Impression Statement: Reassessment complete with the following findings:  Stacy [name] has had 6 OPPT sessions over 4 sessions.  Pt has met 5/5 goals.  Pt independent with HEP and able to demonstrate appropriate technique with all exercises.  Pt reported pain scale average between 1-3/10.  Decreased edema.  Improved functional strength and normalized gait mechanics.  Normalized patella mobility and hamstring lengthening to  WNL.  Pt given HEP worksheet to address weak muscles remaining.   PT Plan: D/C to HEP per all goals met.      Goals Home Exercise Program Pt/caregiver will Perform Home Exercise Program: Independently Met PT Short Term Goals Time to Complete Short Term Goals: 4 weeks PT Short Term Goal 1: Pt will report pain less than 80% of her day to her Rt knee for improved QOL.  Met (pain range 1-3/10) PT Short Term Goal 2: Pt will report decreased edema to her Rt knee.  Met PT Short Term Goal 3: Pt will improve her RLE strength by 1 muscle grade to decrease chance of pain. : Met PT Short Term Goal 4: Pt will present with normalized patellar mobility to decrease pain. : Met PT Short Term Goal 5: Pt will present with improved RLE hamstring length for decreased pain.  Met  Problem List Patient Active Problem List   Diagnosis Date Noted  . Muscle spasms of head and/or neck 11/14/2012  . OA (osteoarthritis) of knee 10/03/2012  . Right knee pain 10/03/2012  . Cervical neck pain with evidence of disc disease 09/24/2012  . Pain in right knee 09/24/2012  . Thyroid nodule 05/09/2012  . Hypothyroidism 09/21/2011  . Ovarian cyst, left 04/27/2011  . ABNORMAL ELECTROCARDIOGRAM 03/04/2010  . CHEST PAIN UNSPECIFIED 12/03/2009  . ANEMIA 09/15/2009  . LEUKOPENIA, CHRONIC 09/15/2009  . FATIGUE 09/15/2009  . OVERWEIGHT 04/24/2009  . ONYCHOMYCOSIS, TOENAILS 05/23/2007  . DYSLIPIDEMIA 05/23/2007  . GERD 05/23/2007    PT - End of Session Activity Tolerance: Patient tolerated treatment well General Behavior During Therapy: [facility] for tasks assessed/performed  GP    Juel Burrow; Annett Fabian, MPT, ATC 12/17/2012, 3:27 PM  Physician Documentation Your signature is required to indicate approval of the treatment plan as stated above.  Please sign and either send electronically or make a copy of this report for your files and return this physician signed original.   Please mark one 1.__approve of plan  2.  ___approve of plan with the following conditions.   ______________________________                                                          _____________________ Physician Signature                                                                                                             Date

## 2012-12-19 ENCOUNTER — Ambulatory Visit (HOSPITAL_COMMUNITY): Payer: Managed Care, Other (non HMO)

## 2012-12-24 ENCOUNTER — Ambulatory Visit (HOSPITAL_COMMUNITY): Payer: Managed Care, Other (non HMO) | Admitting: Physical Therapy

## 2012-12-26 ENCOUNTER — Ambulatory Visit (HOSPITAL_COMMUNITY): Payer: Managed Care, Other (non HMO)

## 2012-12-31 ENCOUNTER — Ambulatory Visit (HOSPITAL_COMMUNITY): Payer: Managed Care, Other (non HMO)

## 2012-12-31 ENCOUNTER — Ambulatory Visit (HOSPITAL_COMMUNITY)
Admission: RE | Admit: 2012-12-31 | Discharge: 2012-12-31 | Disposition: A | Discharge status: Home / Self Care | Payer: Managed Care, Other (non HMO) | Source: Ambulatory Visit | Attending: Family Medicine | Admitting: Family Medicine

## 2012-12-31 ENCOUNTER — Telehealth: Payer: Self-pay | Admitting: Family Medicine

## 2012-12-31 DIAGNOSIS — M1710 Unilateral primary osteoarthritis, unspecified knee: Secondary | ICD-10-CM | POA: Insufficient documentation

## 2012-12-31 DIAGNOSIS — D649 Anemia, unspecified: Secondary | ICD-10-CM

## 2012-12-31 DIAGNOSIS — M25569 Pain in unspecified knee: Secondary | ICD-10-CM | POA: Insufficient documentation

## 2012-12-31 DIAGNOSIS — M6289 Other specified disorders of muscle: Secondary | ICD-10-CM | POA: Insufficient documentation

## 2012-12-31 DIAGNOSIS — M25529 Pain in unspecified elbow: Secondary | ICD-10-CM | POA: Insufficient documentation

## 2012-12-31 DIAGNOSIS — R7301 Impaired fasting glucose: Secondary | ICD-10-CM

## 2012-12-31 DIAGNOSIS — E785 Hyperlipidemia, unspecified: Secondary | ICD-10-CM

## 2012-12-31 DIAGNOSIS — R5381 Other malaise: Secondary | ICD-10-CM

## 2012-12-31 DIAGNOSIS — M542 Cervicalgia: Secondary | ICD-10-CM | POA: Insufficient documentation

## 2012-12-31 DIAGNOSIS — M62838 Other muscle spasm: Secondary | ICD-10-CM | POA: Insufficient documentation

## 2012-12-31 DIAGNOSIS — E039 Hypothyroidism, unspecified: Secondary | ICD-10-CM

## 2012-12-31 DIAGNOSIS — Z5189 Encounter for other specified aftercare: Secondary | ICD-10-CM | POA: Insufficient documentation

## 2012-12-31 NOTE — Evaluation (Addendum)
Occupational Therapy Reassessment  Patient Details  Name: Stacy [name] MRN: [id number] Date of Birth: [birth year]  Today's Date: 12/31/2012 Time: 4098-1191 OT Time Calculation (min): 38 min MFR 4782-9562 17' Reassess 1308-6578 21'  Visit#: 4 of 12  Re-eval: 12/20/12  Assessment Diagnosis: Cervical Neck Pain  Authorization: n/a  Authorization Time Period:    Authorization Visit#:   of     Past Medical History:  Past Medical History  Diagnosis Date  . GERD (gastroesophageal reflux disease)   . Onychomycosis     Left great toe  . Lactose intolerance   . Hashimoto thyroiditis   . Hyperlipidemia    Past Surgical History:  Past Surgical History  Procedure Laterality Date  . Dilation and curettage of uterus      Seconday to miscarriage  . Knee arthroscopy  2003    right knee  . Colonoscopy  04/04/2011    Internal hemorrhoids/Nl colonoscopy-SLIGHTLY TORTUOUS COLON  . Esophagogastroduodenoscopy  09/22/2011    Mild gastritis/Hiatal hernia/DYSPHAGIA DUE TO PEPTIC STRICTURE/UNCONTROLLED GERD  . Savory dilation  09/22/2011    Procedure: SAVORY DILATION;  Surgeon: West Bali, MD;  Location: [facility];  Service: Endoscopy;  Laterality: N/A;  Elease Hashimoto dilation  09/22/2011    Procedure: MALONEY DILATION;  Surgeon: West Bali, MD;  Location: [facility];  Service: Endoscopy;  Laterality: N/A;    Subjective Symptoms/Limitations Symptoms: S: I have noticed a difference. My pain is not as bad when it hurts compared to before. My posture and scapular stability is still not very good.  Special Tests: Neck Pain Disability Index is 6, with ideal score being 0.(on eval: 12) Pain Assessment Currently in Pain?: Yes Pain Score: 1  Pain Location: Neck Pain Orientation: Left Pain Type: Acute pain  Precautions/Restrictions  Precautions Precautions: None   Assessment  Additional Assessments LUE AROM (degrees) Left Shoulder Flexion: 154 Degrees (on eval: 151) Left  Shoulder ABduction: 156 Degrees (on eval: 150) Left Shoulder Internal Rotation: 90 Degrees (same on eval) Left Shoulder External Rotation: 79 Degrees (on eval: 65) LUE PROM (degrees) LUE Overall PROM Comments: assessed in supine, WFL LUE Strength LUE Overall Strength Comments: assessed in seated, ER/IR with shoulder adducted Left Shoulder Flexion: 5/5 (on eval: 4/5) Left Shoulder ABduction: 5/5 Left Shoulder Internal Rotation: 5/5 Left Shoulder External Rotation: 5/5 Cervical AROM Overall Cervical AROM Comments: assessed in seated Cervical Flexion: 3.5 cm (on eval: 5.5 cm) Cervical Extension: 6.0 cm (on eval: 16.0 cm) Cervical - Right Side Bend: 14.0 cm (on eval: 16.0 cm) Cervical - Left Side Bend: 14.0 cm (on eval: 15.0 cm) Cervical - Right Rotation: 14.0 cm (on eval: 18.0 cm) Cervical - Left Rotation: 14.5 cm (on eval: 16.0 cm)     Exercise/Treatments  Manual Therapy Manual Therapy: Myofascial release Myofascial Release: Manual cervical traction, manual left arm traction, MFR to SCM, trapezius, rhomboids, platysmus, and scapular region to decrease pain and fascial restrictions.  Sub occipital release.  Occupational Therapy Assessment and Plan OT Assessment and Plan Clinical Impression Statement: A: See MD note for progress. Patient has made great progress towards goals. Patient would like to continue therapy for one more work to focus on posture and scapular stability. Educated patient on proper body mechanics and techniques for posture to use at in the work place.  OT Plan: P: Cont. therapy for 1 week to work on scapular stebility and posture.    Goals Short Term Goals Time to Complete Short Term Goals: 3 weeks Short Term  Goal 1: Patient will be educated on a HEP. Short Term Goal 1 Progress: Met Short Term Goal 2: Patient will decrease pain to 5/10 when working. Short Term Goal 2 Progress: Met Short Term Goal 3: Patient will improve left shoulder AROM to WNL for increased  ability to lift items into overhead cabinet. Short Term Goal 3 Progress: Met Short Term Goal 4: Patient will improve scapular stability and posture from fair to good. Short Term Goal 4 Progress: Progressing toward goal Short Term Goal 5: Patient will improve cervical AROM to [facility] for increased safety when driving. Short Term Goal 5 Progress: Met Additional Short Term Goals?: Yes Short Term Goal 6: Patient will have min-mod fascial restrictions in her left shoulder region. Short Term Goal 6 Progress: Met Long Term Goals Time to Complete Long Term Goals: 6 weeks Long Term Goal 1: Patient will return to prior level of independence with all B/IADLs, work, and leisure activities.  Long Term Goal 1 Progress: Progressing toward goal Long Term Goal 2: Patient will improve left shoulder flexion strength to 5/5 for increased abiilty to lift items into overhead cabinets.  Long Term Goal 2 Progress: Met Long Term Goal 3: Patient will improve scapular stability and posture to normal. Long Term Goal 3 Progress: Progressing toward goal Long Term Goal 4: Patient will decrease pain in her neck to 2/10 when working. Long Term Goal 4 Progress: Met Long Term Goal 5: Patient will have minimal fascial restrictions in her left scapular region.  Long Term Goal 5 Progress: Progressing toward goal Additional Long Term Goals?: Yes Long Term Goal 6: Patient will have WNL cervical AROM.  Long Term Goal 6 Progress: Met  Problem List Patient Active Problem List   Diagnosis Date Noted  . Muscle spasms of head and/or neck 11/14/2012  . OA (osteoarthritis) of knee 10/03/2012  . Right knee pain 10/03/2012  . Cervical neck pain with evidence of disc disease 09/24/2012  . Pain in right knee 09/24/2012  . Thyroid nodule 05/09/2012  . Hypothyroidism 09/21/2011  . Ovarian cyst, left 04/27/2011  . ABNORMAL ELECTROCARDIOGRAM 03/04/2010  . CHEST PAIN UNSPECIFIED 12/03/2009  . ANEMIA 09/15/2009  . LEUKOPENIA, CHRONIC  09/15/2009  . FATIGUE 09/15/2009  . OVERWEIGHT 04/24/2009  . ONYCHOMYCOSIS, TOENAILS 05/23/2007  . DYSLIPIDEMIA 05/23/2007  . GERD 05/23/2007    End of Session Activity Tolerance: Patient tolerated treatment well General Behavior During Therapy: [facility] for tasks assessed/performed   Limmie Patricia, OTR/L,CBIS   12/31/2012, 4:01 PM  Physician Documentation Your signature is required to indicate approval of the treatment plan as stated above.  Please sign and either send electronically or make a copy of this report for your files and return this physician signed original.  Please mark one 1.__approve of plan  2. ___approve of plan with the following conditions.   ______________________________                                                          _____________________ Physician Signature  Date  

## 2012-12-31 NOTE — Telephone Encounter (Signed)
Pls order fasting chem 7 , HBA1C and TSH to be drawn next week pt is aware. Fax order to lab.Pls request that TSH value be sent too Dr Fransico Him also  She has also ben asked to schedule her mammogram which is past due, and to make an appt to come to the office

## 2013-01-01 NOTE — Addendum Note (Signed)
Addended by: Abner Greenspan on: 01/01/2013 02:11 PM   Modules accepted: Orders

## 2013-01-01 NOTE — Telephone Encounter (Signed)
Labs faxed with no to send copy to Dr Fransico Him and the fax number

## 2013-01-02 ENCOUNTER — Ambulatory Visit (HOSPITAL_COMMUNITY): Payer: Managed Care, Other (non HMO) | Admitting: Specialist

## 2013-01-07 ENCOUNTER — Ambulatory Visit (HOSPITAL_COMMUNITY)
Admission: RE | Admit: 2013-01-07 | Discharge: 2013-01-07 | Disposition: A | Discharge status: Home / Self Care | Payer: Managed Care, Other (non HMO) | Source: Ambulatory Visit | Attending: Family Medicine | Admitting: Family Medicine

## 2013-01-07 DIAGNOSIS — M62838 Other muscle spasm: Secondary | ICD-10-CM

## 2013-01-07 NOTE — Progress Notes (Signed)
Occupational Therapy Treatment Patient Details  Name: Stacy [name] MRN: [id number] Date of Birth: [birth year]  Today's Date: 01/07/2013 Time: 9147-8295 OT Time Calculation (min): 41 min MFR 6213-0865 9' Therex 7846-9629 32'  Visit#: 5 of 12  Re-eval: 12/20/12    Authorization: n/a  Authorization Time Period:    Authorization Visit#:   of    Subjective Symptoms/Limitations Symptoms: S: My neck feels pretty good today.   Precautions/Restrictions  Precautions Precautions: None  Exercise/Treatments Prone  Retraction: Strengthening;Weights;10 reps Retraction Weight (lbs): 2 Flexion: Strengthening;Weights;10 reps Flexion Weight (lbs): 2 Extension: Strengthening;Weights;10 reps Extension Weight (lbs): 2 Sidelying External Rotation: Strengthening;Weights;10 reps External Rotation Weight (lbs): 2 Internal Rotation: Strengthening;Weights;10 reps Internal Rotation Weight (lbs): 2 Flexion: Strengthening;Weights;10 reps Flexion Weight (lbs): 2 ABduction: Strengthening;Weights;10 reps ABduction Weight (lbs): 2 Standing Other Standing Exercises: angel wings; 10 reps ROM / Strengthening / Isometric Strengthening UBE (Upper Arm Bike): 1.5 3' forward 3' reverse Ball on Wall: 1' green       Manual Therapy Manual Therapy: Myofascial release Myofascial Release: MFR to SCM, trapezius, rhomboids, platysmus, and scapular region to decrease pain and fascial restrictions.  Occupational Therapy Assessment and Plan OT Assessment and Plan Clinical Impression Statement: A: Added exercises to increase scapular stability. Patient tolerated well and was given sidelying and prone exercises as HEP. OT Plan: P: Reassess and D/C with review of ways to increase good posture.   Goals Short Term Goals Time to Complete Short Term Goals: 3 weeks Short Term Goal 1: Patient will be educated on a HEP. Short Term Goal 2: Patient will decrease pain to 5/10 when working. Short Term Goal 3:  Patient will improve left shoulder AROM to WNL for increased ability to lift items into overhead cabinet. Short Term Goal 4: Patient will improve scapular stability and posture from fair to good. Short Term Goal 4 Progress: Progressing toward goal Short Term Goal 5: Patient will improve cervical AROM to [facility] for increased safety when driving. Additional Short Term Goals?: Yes Short Term Goal 6: Patient will have min-mod fascial restrictions in her left shoulder region. Long Term Goals Time to Complete Long Term Goals: 6 weeks Long Term Goal 1: Patient will return to prior level of independence with all B/IADLs, work, and leisure activities.  Long Term Goal 1 Progress: Progressing toward goal Long Term Goal 2: Patient will improve left shoulder flexion strength to 5/5 for increased abiilty to lift items into overhead cabinets.  Long Term Goal 3: Patient will improve scapular stability and posture to normal. Long Term Goal 3 Progress: Progressing toward goal Long Term Goal 4: Patient will decrease pain in her neck to 2/10 when working. Long Term Goal 5: Patient will have minimal fascial restrictions in her left scapular region.  Additional Long Term Goals?: Yes Long Term Goal 6: Patient will have WNL cervical AROM.   Problem List Patient Active Problem List   Diagnosis Date Noted  . Muscle spasms of head and/or neck 11/14/2012  . OA (osteoarthritis) of knee 10/03/2012  . Right knee pain 10/03/2012  . Cervical neck pain with evidence of disc disease 09/24/2012  . Pain in right knee 09/24/2012  . Thyroid nodule 05/09/2012  . Hypothyroidism 09/21/2011  . Ovarian cyst, left 04/27/2011  . ABNORMAL ELECTROCARDIOGRAM 03/04/2010  . CHEST PAIN UNSPECIFIED 12/03/2009  . ANEMIA 09/15/2009  . LEUKOPENIA, CHRONIC 09/15/2009  . FATIGUE 09/15/2009  . OVERWEIGHT 04/24/2009  . ONYCHOMYCOSIS, TOENAILS 05/23/2007  . DYSLIPIDEMIA 05/23/2007  . GERD 05/23/2007    End  of Session Activity Tolerance:  Patient tolerated treatment well General Behavior During Therapy: WFL for tasks assessed/performed OT Plan of Care OT Home Exercise Plan: sidelying and prone strenghening exercises OT Patient Instructions: handout - scanned Consulted and Agree with Plan of Care: Patient   Limmie Patricia, OTR/L,CBIS   01/07/2013, 4:34 PM

## 2013-01-09 ENCOUNTER — Ambulatory Visit (HOSPITAL_COMMUNITY): Payer: Managed Care, Other (non HMO) | Admitting: Specialist

## 2013-01-14 ENCOUNTER — Ambulatory Visit (HOSPITAL_COMMUNITY)
Admission: RE | Admit: 2013-01-14 | Discharge: 2013-01-14 | Disposition: A | Discharge status: Home / Self Care | Payer: Managed Care, Other (non HMO) | Source: Ambulatory Visit | Attending: Family Medicine | Admitting: Family Medicine

## 2013-01-14 DIAGNOSIS — M62838 Other muscle spasm: Secondary | ICD-10-CM

## 2013-01-14 NOTE — Progress Notes (Signed)
Occupational Therapy Treatment Patient Details  Name: Stacy [name] MRN: [id number] Date of Birth: [birth year]  Today's Date: 01/14/2013 Time: 7829-5621 OT Time Calculation (min): 43 min MFR 1347-1400 13' Therex 1400-1430 30'  Visit#: 6 of 12  Re-eval: 12/20/12 Assessment Diagnosis: Cervical Neck Pain  Authorization: n/a  Authorization Time Period:    Authorization Visit#:   of    Subjective Symptoms/Limitations Symptoms: S: Today I'm ready for graduation.  Special Tests: Neck Pain Disability Index is 6, with ideal score being 0.(on eval: 12) Pain Assessment Currently in Pain?: Yes Pain Score: 1  Pain Location: Shoulder Pain Orientation: Left Pain Type: Acute pain  Precautions/Restrictions  Precautions Precautions: None  Exercise/Treatments Prone  Retraction: Strengthening;Weights;10 reps Retraction Weight (lbs): 2 Flexion: Strengthening;Weights;10 reps Flexion Weight (lbs): 2 Extension: Strengthening;Weights;10 reps Extension Weight (lbs): 2 Horizontal ABduction 1: Strengthening;Weights;10 reps Horizontal ABduction 1 Weight (lbs): 2 Sidelying External Rotation: Strengthening;Weights;10 reps External Rotation Weight (lbs): 2 Internal Rotation: Strengthening;Weights;10 reps Internal Rotation Weight (lbs): 2 Flexion: Strengthening;Weights;10 reps Flexion Weight (lbs): 2 ABduction: Strengthening;Weights;10 reps ABduction Weight (lbs): 2 Standing Other Standing Exercises: angel wings; 10 reps ROM / Strengthening / Isometric Strengthening Ball on Wall: 1' green        Manual Therapy Manual Therapy: Myofascial release Myofascial Release: MFR to SCM, trapezius, rhomboids, platysmus, and scapular region to decrease pain and fascial restrictions  Occupational Therapy Assessment and Plan OT Assessment and Plan Clinical Impression Statement: A: Completed all exercises for scapular stability and educated patient on form and technique. Encouraged patient to  continue exercises for shoulder strengthening [facility]  OT Plan: P: D/C from therapy.   Goals Short Term Goals Time to Complete Short Term Goals: 3 weeks Short Term Goal 1: Patient will be educated on a HEP. Short Term Goal 2: Patient will decrease pain to 5/10 when working. Short Term Goal 3: Patient will improve left shoulder AROM to WNL for increased ability to lift items into overhead cabinet. Short Term Goal 4: Patient will improve scapular stability and posture from fair to good. Short Term Goal 4 Progress: Met Short Term Goal 5: Patient will improve cervical AROM to [facility] for increased safety when driving. Additional Short Term Goals?: Yes Short Term Goal 6: Patient will have min-mod fascial restrictions in her left shoulder region. Long Term Goals Time to Complete Long Term Goals: 6 weeks Long Term Goal 1: Patient will return to prior level of independence with all B/IADLs, work, and leisure activities.  Long Term Goal 1 Progress: Met Long Term Goal 2: Patient will improve left shoulder flexion strength to 5/5 for increased abiilty to lift items into overhead cabinets.  Long Term Goal 3: Patient will improve scapular stability and posture to normal. Long Term Goal 3 Progress: Met Long Term Goal 4: Patient will decrease pain in her neck to 2/10 when working. Long Term Goal 5: Patient will have minimal fascial restrictions in her left scapular region.  Long Term Goal 5 Progress: Met Additional Long Term Goals?: Yes Long Term Goal 6: Patient will have WNL cervical AROM.   Problem List Patient Active Problem List   Diagnosis Date Noted  . Muscle spasms of head and/or neck 11/14/2012  . OA (osteoarthritis) of knee 10/03/2012  . Right knee pain 10/03/2012  . Cervical neck pain with evidence of disc disease 09/24/2012  . Pain in right knee 09/24/2012  . Thyroid nodule 05/09/2012  . Hypothyroidism 09/21/2011  . Ovarian cyst, left 04/27/2011  . ABNORMAL  ELECTROCARDIOGRAM 03/04/2010  . CHEST PAIN UNSPECIFIED 12/03/2009  . ANEMIA 09/15/2009  . LEUKOPENIA, CHRONIC 09/15/2009  . FATIGUE 09/15/2009  . OVERWEIGHT 04/24/2009  . ONYCHOMYCOSIS, TOENAILS 05/23/2007  . DYSLIPIDEMIA 05/23/2007  . GERD 05/23/2007    End of Session Activity Tolerance: Patient tolerated treatment well General Behavior During Therapy: [facility] for tasks assessed/performed   Limmie Patricia, OTR/L,CBIS   01/14/2013, 2:27 PM

## 2013-01-16 ENCOUNTER — Other Ambulatory Visit: Payer: Self-pay | Admitting: Family Medicine

## 2013-01-16 ENCOUNTER — Ambulatory Visit (HOSPITAL_COMMUNITY): Payer: Managed Care, Other (non HMO)

## 2013-01-16 DIAGNOSIS — Z139 Encounter for screening, unspecified: Secondary | ICD-10-CM

## 2013-01-21 ENCOUNTER — Ambulatory Visit (HOSPITAL_COMMUNITY): Payer: Managed Care, Other (non HMO)

## 2013-01-21 ENCOUNTER — Ambulatory Visit (HOSPITAL_COMMUNITY)
Admission: RE | Admit: 2013-01-21 | Discharge: 2013-01-21 | Disposition: A | Discharge status: Home / Self Care | Payer: Managed Care, Other (non HMO) | Source: Ambulatory Visit | Attending: Family Medicine | Admitting: Family Medicine

## 2013-01-21 DIAGNOSIS — Z139 Encounter for screening, unspecified: Secondary | ICD-10-CM

## 2013-01-21 DIAGNOSIS — Z1231 Encounter for screening mammogram for malignant neoplasm of breast: Secondary | ICD-10-CM | POA: Insufficient documentation

## 2013-01-21 LAB — BASIC METABOLIC PANEL
Chloride: 105 mEq/L (ref 96–112)
Glucose, Bld: 80 mg/dL (ref 70–99)
Potassium: 3.9 mEq/L (ref 3.5–5.3)
Sodium: 140 mEq/L (ref 135–145)

## 2013-01-22 LAB — TSH: TSH: 1.046 u[IU]/mL (ref 0.350–4.500)

## 2013-01-29 ENCOUNTER — Ambulatory Visit: Payer: Managed Care, Other (non HMO) | Admitting: Gastroenterology

## 2013-01-30 ENCOUNTER — Ambulatory Visit: Payer: Managed Care, Other (non HMO) | Admitting: Gastroenterology

## 2013-01-30 ENCOUNTER — Encounter: Payer: Self-pay | Admitting: Family Medicine

## 2013-01-30 ENCOUNTER — Ambulatory Visit (INDEPENDENT_AMBULATORY_CARE_PROVIDER_SITE_OTHER): Payer: Managed Care, Other (non HMO) | Admitting: Family Medicine

## 2013-01-30 VITALS — BP 122/74 | HR 64 | Resp 18 | Ht 67.0 in | Wt 186.0 lb

## 2013-01-30 DIAGNOSIS — Z23 Encounter for immunization: Secondary | ICD-10-CM

## 2013-01-30 DIAGNOSIS — R7303 Prediabetes: Secondary | ICD-10-CM | POA: Insufficient documentation

## 2013-01-30 DIAGNOSIS — R7309 Other abnormal glucose: Secondary | ICD-10-CM

## 2013-01-30 DIAGNOSIS — G4730 Sleep apnea, unspecified: Secondary | ICD-10-CM

## 2013-01-30 DIAGNOSIS — N83202 Unspecified ovarian cyst, left side: Secondary | ICD-10-CM

## 2013-01-30 DIAGNOSIS — K219 Gastro-esophageal reflux disease without esophagitis: Secondary | ICD-10-CM

## 2013-01-30 DIAGNOSIS — N83209 Unspecified ovarian cyst, unspecified side: Secondary | ICD-10-CM

## 2013-01-30 DIAGNOSIS — E663 Overweight: Secondary | ICD-10-CM

## 2013-01-30 DIAGNOSIS — E039 Hypothyroidism, unspecified: Secondary | ICD-10-CM

## 2013-01-30 DIAGNOSIS — M5090 Cervical disc disorder, unspecified, unspecified cervical region: Secondary | ICD-10-CM

## 2013-01-30 MED ORDER — METFORMIN HCL ER 500 MG PO TB24: 500.0000 mg | ORAL_TABLET | Freq: Every day | ORAL | Status: DC

## 2013-01-30 NOTE — Patient Instructions (Signed)
F/u in 2nd week in December  Please commit  To daily physical activity.  Clear away candy   I will arrange sleep study   New is daily metformin    Weight loss goal of 5 pounds  Chem 7 in December

## 2013-02-01 DIAGNOSIS — G479 Sleep disorder, unspecified: Secondary | ICD-10-CM | POA: Insufficient documentation

## 2013-02-01 NOTE — Assessment & Plan Note (Signed)
Uses dexilant as needed  For reflux symptoms, reducing caffeine intake is strongly encouraged

## 2013-02-01 NOTE — Assessment & Plan Note (Signed)
Followed by gyne, states she ahs been seen this year, will need to send for oV

## 2013-02-01 NOTE — Progress Notes (Signed)
  Subjective:    Patient ID: Stacy [name], female    DOB: [birth year], 52 y.o.   MRN: [id number]  HPI The PT is here for follow up and re-evaluation of chronic medical conditions, medication management and review of any available recent lab and radiology data.  Preventive health is updated, specifically  Cancer screening and Immunization.   Questions or concerns regarding consultations or procedures which the PT has had in the interim are  Addressed.Recently saw endo, no change in thyroid med. Metformin  Was suggested by endo, she was resisting at the time , despite HBA1C up to 6.3 The PT denies any adverse reactions to current medications since the last visit.  Still depending on sugar and caffeine for energy, still no regular exercise, still has not had sleep study,  Gradually planning to work on all 3 to improve her health    Review of Systems See HPI Denies recent fever or chills.Chronic fatigue, excessive daytime sleepiness and snoring Denies sinus pressure, nasal congestion, ear pain or sore throat. Denies chest congestion, productive cough or wheezing. Denies chest pains, palpitations and leg swelling Denies abdominal pain, nausea, vomiting,diarrhea or constipation.   Denies dysuria, frequency, hesitancy or incontinence. Denies joint pain, swelling and limitation in mobility. Denies headaches, seizures, numbness, or tingling. Denies depression, anxiety or insomnia. Denies skin break down or rash.        Objective:   Physical Exam  Patient alert and oriented and in no cardiopulmonary distress.  HEENT: No facial asymmetry, EOMI, no sinus tenderness,  oropharynx pink and moist.  Neck supple no adenopathy.  Chest: Clear to auscultation bilaterally.  CVS: S1, S2 no murmurs, no S3.  ABD: Soft non tender. Bowel sounds normal.  Ext: No edema  MS: Adequate ROM spine, shoulders, hips and knees.  Skin: Intact, no ulcerations or rash noted.  Psych: Good eye contact,  normal affect. Memory intact not anxious or depressed appearing.  CNS: CN 2-12 intact, power, tone and sensation normal throughout.       Assessment & Plan:

## 2013-02-01 NOTE — Assessment & Plan Note (Signed)
H/o excessive daytime sleepiness with excessive snoring and headaches, needs sleep study and referral will be entered, she has been referred to neurology in the past , bur has not followed through

## 2013-02-01 NOTE — Assessment & Plan Note (Signed)
Unchanged. Patient re-educated about  the importance of commitment to a  minimum of 150 minutes of exercise per week. The importance of healthy food choices with portion control discussed. Encouraged to start a food diary, count calories and to consider  joining a support group. Sample diet sheets offered. Goals set by the patient for the next several months.    

## 2013-02-01 NOTE — Assessment & Plan Note (Signed)
Controlled, no change in medication Followed by endocrine

## 2013-02-01 NOTE — Assessment & Plan Note (Signed)
Improved with physical therapy and injections x 2 Pt now on home exercises , and she admits she needs to be more consistent with this

## 2013-02-01 NOTE — Assessment & Plan Note (Signed)
Deteriorated. Pt to start metformin, work on reducing to eliminating processed sugar intake , which shje depends on for energy, and commit to regular daily physical exercise. She ahs been made aware that her weight alone is not the driving factor to becoming diabetic,a s this has remained the same

## 2013-02-06 ENCOUNTER — Encounter: Payer: Self-pay | Admitting: Gastroenterology

## 2013-02-06 ENCOUNTER — Ambulatory Visit (INDEPENDENT_AMBULATORY_CARE_PROVIDER_SITE_OTHER): Payer: Managed Care, Other (non HMO) | Admitting: Gastroenterology

## 2013-02-06 VITALS — BP 117/65 | HR 62 | Temp 97.7°F | Ht 67.0 in | Wt 184.2 lb

## 2013-02-06 DIAGNOSIS — K219 Gastro-esophageal reflux disease without esophagitis: Secondary | ICD-10-CM

## 2013-02-06 NOTE — Progress Notes (Signed)
  Subjective:    Patient ID: Stacy [name], female    DOB: 24-Dec-1960, 52 y.o.   MRN: [id number] Syliva Overman, MD  HPI SX EVERY DAY-BELCHING AND BURNING IN YOUR UPPER ABD/EPIGASTRIUM. NEXIUM ONCE A DAY WON'T HELP. NOW TRYING HARDER WITH HER DIET. COULD DO BETTER WITH A ONCE A DAY PPI. WILLING TO TRY BID. NOW ON MEDS FOR DIABETES BUT NOT REALLY TAKING THEM. SHE IS CONVINCED SHE CAN DO THIS WITH DIET, EXERCISE, AND WEIGHT LOSS. BMs: GOOD.  SWALLOWING IS GOOD. NO ASPIRIN, BC/GOODY POWDERS, IBUPROFEN/MOTRIN, OR NAPROXEN/ALEVE.  PT DENIES FEVER, CHILLS, BRBPR, nausea, vomiting, melena, diarrhea, constipation, OR problems swallowing..  Past Medical History  Diagnosis Date  . GERD (gastroesophageal reflux disease)   . Onychomycosis     Left great toe  . Lactose intolerance   . Hashimoto thyroiditis   . Hyperlipidemia    Past Surgical History  Procedure Laterality Date  . Dilation and curettage of uterus      Seconday to miscarriage  . Knee arthroscopy  2003    right knee  . Colonoscopy  04/04/2011    Internal hemorrhoids/Nl colonoscopy-SLIGHTLY TORTUOUS COLON  . Esophagogastroduodenoscopy  09/22/2011    Mild gastritis/Hiatal hernia/DYSPHAGIA DUE TO PEPTIC STRICTURE/UNCONTROLLED GERD  . Savory dilation  09/22/2011    Procedure: SAVORY DILATION;  Surgeon: West Bali, MD;  Location: [facility];  Service: Endoscopy;  Laterality: N/A;  Elease Hashimoto dilation  09/22/2011    Procedure: MALONEY DILATION;  Surgeon: West Bali, MD;  Location: [facility];  Service: Endoscopy;  Laterality: N/A;   No Known Allergies  Current Outpatient Prescriptions  Medication Sig Dispense Refill  .      . levothyroxine (SYNTHROID, LEVOTHROID) 50 MCG tablet Take 100 mcg by mouth daily before breakfast.       . Multiple Vitamin (MULTIVITAMIN) tablet Take 1 tablet by mouth daily.        .           Review of Systems     Objective:   Physical Exam  Vitals reviewed. Constitutional: She is  oriented to person, place, and time. She appears well-nourished. No distress.  HENT:  Head: Normocephalic and atraumatic.  Mouth/Throat: No oropharyngeal exudate.  Eyes: Pupils are equal, round, and reactive to light. No scleral icterus.  Neck: Normal range of motion. Neck supple.  Cardiovascular: Normal rate, regular rhythm and normal heart sounds.   Pulmonary/Chest: Effort normal and breath sounds normal. No respiratory distress.  Abdominal: Soft. Bowel sounds are normal. She exhibits no distension. There is tenderness. There is no rebound and no guarding.  MILD TTP IN THE EPIGASTRIUM   Musculoskeletal: She exhibits no edema.  Lymphadenopathy:    She has no cervical adenopathy.  Neurological: She is alert and oriented to person, place, and time.  NO FOCAL DEFICITS  Psychiatric: She has a normal mood and affect.          Assessment & Plan:

## 2013-02-06 NOTE — Patient Instructions (Signed)
CONTINUE YOUR WEIGHT LOSS EFFORTS.  DRINK WATER TO KEEP YOUR URINE LIGHT YELLOW.  FOLLOW A HIGH FIBER/LOW FAT DIET. SEE INFO BELOW.  CONTINUE NEXIUM. TAKE 30 MINUTES BEFORE MEALS TWICE DAILY.  FOLLOW UP IN 4 MOS.   High-Fiber Diet A high-fiber diet changes your normal diet to include more whole grains, legumes, fruits, and vegetables. Changes in the diet involve replacing refined carbohydrates with unrefined foods. The calorie level of the diet is essentially unchanged. The Dietary Reference Intake (recommended amount) for adult males is 38 grams per day. For adult females, it is 25 grams per day. Pregnant and lactating women should consume 28 grams of fiber per day. Fiber is the intact part of a plant that is not broken down during digestion. Functional fiber is fiber that has been isolated from the plant to provide a beneficial effect in the body. PURPOSE  Increase stool bulk.   Ease and regulate bowel movements.   Lower cholesterol.  INDICATIONS THAT YOU NEED MORE FIBER  Constipation and hemorrhoids.   Uncomplicated diverticulosis (intestine condition) and irritable bowel syndrome.   Weight management.   As a protective measure against hardening of the arteries (atherosclerosis), diabetes, and cancer.   GUIDELINES FOR INCREASING FIBER IN THE DIET  Start adding fiber to the diet slowly. A gradual increase of about 5 more grams (2 slices of whole-wheat bread, 2 servings of most fruits or vegetables, or 1 bowl of high-fiber cereal) per day is best. Too rapid an increase in fiber may result in constipation, flatulence, and bloating.   Drink enough water and fluids to keep your urine clear or pale yellow. Water, juice, or caffeine-free drinks are recommended. Not drinking enough fluid may cause constipation.   Eat a variety of high-fiber foods rather than one type of fiber.   Try to increase your intake of fiber through using high-fiber foods rather than fiber pills or  supplements that contain small amounts of fiber.   The goal is to change the types of food eaten. Do not supplement your present diet with high-fiber foods, but replace foods in your present diet.  INCLUDE A VARIETY OF FIBER SOURCES  Replace refined and processed grains with whole grains, canned fruits with fresh fruits, and incorporate other fiber sources. White rice, white breads, and most bakery goods contain little or no fiber.   Brown whole-grain rice, buckwheat oats, and many fruits and vegetables are all good sources of fiber. These include: broccoli, Brussels sprouts, cabbage, cauliflower, beets, sweet potatoes, white potatoes (skin on), carrots, tomatoes, eggplant, squash, berries, fresh fruits, and dried fruits.   Cereals appear to be the richest source of fiber. Cereal fiber is found in whole grains and bran. Bran is the fiber-rich outer coat of cereal grain, which is largely removed in refining. In whole-grain cereals, the bran remains. In breakfast cereals, the largest amount of fiber is found in those with "bran" in their names. The fiber content is sometimes indicated on the label.   You may need to include additional fruits and vegetables each day.   In baking, for 1 cup white flour, you may use the following substitutions:   1 cup whole-wheat flour minus 2 tablespoons.   1/2 cup white flour plus 1/2 cup whole-wheat flour.   Low-Fat Diet BREADS, CEREALS, PASTA, RICE, DRIED PEAS, AND BEANS These products are high in carbohydrates and most are low in fat. Therefore, they can be increased in the diet as substitutes for fatty foods. They too, however, contain calories  and should not be eaten in excess. Cereals can be eaten for snacks as well as for breakfast.   FRUITS AND VEGETABLES It is good to eat fruits and vegetables. Besides being sources of fiber, both are rich in vitamins and some minerals. They help you get the daily allowances of these nutrients. Fruits and vegetables  can be used for snacks and desserts.  MEATS Limit lean meat, chicken, [place], and fish to no more than 6 ounces per day. Beef, Pork, and Lamb Use lean cuts of beef, pork, and lamb. Lean cuts include:  Extra-lean ground beef.  Arm roast.  Sirloin tip.  Center-cut ham.  Round steak.  Loin chops.  Rump roast.  Tenderloin.  Trim all fat off the outside of meats before cooking. It is not necessary to severely decrease the intake of red meat, but lean choices should be made. Lean meat is rich in protein and contains a highly absorbable form of iron. Premenopausal women, in particular, should avoid reducing lean red meat because this could increase the risk for low red blood cells (iron-deficiency anemia).  Chicken and [place] These are good sources of protein. The fat of poultry can be reduced by removing the skin and underlying fat layers before cooking. Chicken and [place] can be substituted for lean red meat in the diet. Poultry should not be fried or covered with high-fat sauces. Fish and Shellfish Fish is a good source of protein. Shellfish contain cholesterol, but they usually are low in saturated fatty acids. The preparation of fish is important. Like chicken and [place], they should not be fried or covered with high-fat sauces. EGGS Egg whites contain no fat or cholesterol. They can be eaten often. Try 1 to 2 egg whites instead of whole eggs in recipes or use egg substitutes that do not contain yolk. MILK AND DAIRY PRODUCTS Use skim or 1% milk instead of 2% or whole milk. Decrease whole milk, natural, and processed cheeses. Use nonfat or low-fat (2%) cottage cheese or low-fat cheeses made from vegetable oils. Choose nonfat or low-fat (1 to 2%) yogurt. Experiment with evaporated skim milk in recipes that call for heavy cream. Substitute low-fat yogurt or low-fat cottage cheese for sour cream in dips and salad dressings. Have at least 2 servings of low-fat dairy products, such as 2 glasses of  skim (or 1%) milk each day to help get your daily calcium intake. FATS AND OILS Reduce the total intake of fats, especially saturated fat. Butterfat, lard, and beef fats are high in saturated fat and cholesterol. These should be avoided as much as possible. Vegetable fats do not contain cholesterol, but certain vegetable fats, such as coconut oil, palm oil, and palm kernel oil are very high in saturated fats. These should be limited. These fats are often used in bakery goods, processed foods, popcorn, oils, and nondairy creamers. Vegetable shortenings and some peanut butters contain hydrogenated oils, which are also saturated fats. Read the labels on these foods and check for saturated vegetable oils. Unsaturated vegetable oils and fats do not raise blood cholesterol. However, they should be limited because they are fats and are high in calories. Total fat should still be limited to 30% of your daily caloric intake. Desirable liquid vegetable oils are corn oil, cottonseed oil, olive oil, canola oil, safflower oil, soybean oil, and sunflower oil. Peanut oil is not as good, but small amounts are acceptable. Buy a heart-healthy tub margarine that has no partially hydrogenated oils in the ingredients. Mayonnaise and salad  dressings often are made from unsaturated fats, but they should also be limited because of their high calorie and fat content. Seeds, nuts, peanut butter, olives, and avocados are high in fat, but the fat is mainly the unsaturated type. These foods should be limited mainly to avoid excess calories and fat. OTHER EATING TIPS Snacks  Most sweets should be limited as snacks. They tend to be rich in calories and fats, and their caloric content outweighs their nutritional value. Some good choices in snacks are graham crackers, melba toast, soda crackers, bagels (no egg), English muffins, fruits, and vegetables. These snacks are preferable to snack crackers, [place] fries, TORTILLA CHIPS, and POTATO  chips. Popcorn should be air-popped or cooked in small amounts of liquid vegetable oil. Desserts Eat fruit, low-fat yogurt, and fruit ices instead of pastries, cake, and cookies. Sherbet, angel food cake, gelatin dessert, frozen low-fat yogurt, or other frozen products that do not contain saturated fat (pure fruit juice bars, frozen ice pops) are also acceptable.  COOKING METHODS Choose those methods that use little or no fat. They include: Poaching.  Braising.  Steaming.  Grilling.  Baking.  Stir-frying.  Broiling.  Microwaving.  Foods can be cooked in a nonstick pan without added fat, or use a nonfat cooking spray in regular cookware. Limit fried foods and avoid frying in saturated fat. Add moisture to lean meats by using water, broth, cooking wines, and other nonfat or low-fat sauces along with the cooking methods mentioned above. Soups and stews should be chilled after cooking. The fat that forms on top after a few hours in the refrigerator should be skimmed off. When preparing meals, avoid using excess salt. Salt can contribute to raising blood pressure in some people.  EATING AWAY FROM HOME Order entres, potatoes, and vegetables without sauces or butter. When meat exceeds the size of a deck of cards (3 to 4 ounces), the rest can be taken home for another meal. Choose vegetable or fruit salads and ask for low-calorie salad dressings to be served on the side. Use dressings sparingly. Limit high-fat toppings, such as bacon, crumbled eggs, cheese, sunflower seeds, and olives. Ask for heart-healthy tub margarine instead of butter.

## 2013-02-07 NOTE — Assessment & Plan Note (Addendum)
Sx not ideally controlled on Nexium.  CONTINUE WEIGHT LOSS EFFORTS. DRINK WATER TO KEEP URINE LIGHT YELLOW. FOLLOW A HIGH FIBER/LOW FAT DIET.  CONTINUE NEXIUM. TAKE 30 MINUTES BEFORE MEALS TWICE DAILY. Consider Dexilant if Sx not ideally controlled. FOLLOW UP IN 4 MOS.

## 2013-02-10 NOTE — Progress Notes (Signed)
cc'd to pcp 

## 2013-02-11 ENCOUNTER — Other Ambulatory Visit: Payer: Self-pay | Admitting: Family Medicine

## 2013-02-11 DIAGNOSIS — R400 Somnolence: Secondary | ICD-10-CM

## 2013-02-11 DIAGNOSIS — R0683 Snoring: Secondary | ICD-10-CM

## 2013-02-11 DIAGNOSIS — R5381 Other malaise: Secondary | ICD-10-CM

## 2013-02-11 NOTE — Progress Notes (Signed)
Reminder in epic °

## 2013-03-05 ENCOUNTER — Ambulatory Visit: Payer: Managed Care, Other (non HMO) | Attending: Family Medicine | Admitting: Sleep Medicine

## 2013-03-05 DIAGNOSIS — R0683 Snoring: Secondary | ICD-10-CM

## 2013-03-05 DIAGNOSIS — R400 Somnolence: Secondary | ICD-10-CM

## 2013-03-05 DIAGNOSIS — R5381 Other malaise: Secondary | ICD-10-CM

## 2013-03-05 DIAGNOSIS — G4733 Obstructive sleep apnea (adult) (pediatric): Secondary | ICD-10-CM | POA: Insufficient documentation

## 2013-03-08 NOTE — Procedures (Signed)
[facility] Tarshia Kot A. Gerilyn Pilgrim, MD     [email or web address]         NAMEBRANDE, UNCAPHER             ACCOUNT NO.:  [id number]  MEDICAL RECORD NO.:  [id number]          PATIENT TYPE:  OUT  LOCATION:  SLEEP LAB                     FACILITY:  [facility]  PHYSICIAN:  Quinntin Malter A. Gerilyn Pilgrim, M.D. DATE OF BIRTH:  Feb 17, 1961  DATE OF STUDY:  03/05/2013                           NOCTURNAL POLYSOMNOGRAM  REFERRING PHYSICIAN:  Milus Mallick. Lodema Hong, M.D.  INDICATION:  This is a 52 year old, who presents with loud snoring, hypersomnia, and fatigue.   MEDICATIONS:  Nexium and levothyroxine.  EPWORTH SLEEPINESS SCALE:  15.  BMI:  29.  ARCHITECTURAL SUMMARY:  The total recording time is 363 minutes.  Sleep efficiency 93%.  Sleep latency 2.5 minutes.  REM latency 66 minutes. Stage N1 60%, N2 75%, N3 3%, and REM sleep 16%.  RESPIRATORY SUMMARY:  Baseline oxygen saturation is 98, lowest saturation 84 during REM sleep.  Diagnostic AHI is 19 and RDI 23.  More events occurred during REM sleep with a REM AHI being 54.  LIMB MOVEMENT SUMMARY:  PLM index 0.  ELECTROCARDIOGRAM SUMMARY:  Average heart rate is 69 with no significant dysrhythmias observed.  IMPRESSION:  Moderate obstructive sleep apnea syndrome, worse during REM sleep.  RECOMMENDATION:  AutoPAP or formal CPAP titration recording.     Jemiah Cuadra A. Gerilyn Pilgrim, M.D.    KAD/MEDQ  D:  03/08/2013 18:24:06  T:  03/08/2013 18:38:31  Job:  [id number]

## 2013-03-10 ENCOUNTER — Other Ambulatory Visit: Payer: Self-pay | Admitting: Family Medicine

## 2013-03-10 ENCOUNTER — Telehealth: Payer: Self-pay | Admitting: Family Medicine

## 2013-03-10 DIAGNOSIS — G4730 Sleep apnea, unspecified: Secondary | ICD-10-CM

## 2013-03-11 ENCOUNTER — Other Ambulatory Visit: Payer: Self-pay | Admitting: Family Medicine

## 2013-03-11 DIAGNOSIS — G4730 Sleep apnea, unspecified: Secondary | ICD-10-CM

## 2013-03-11 NOTE — Telephone Encounter (Signed)
Referral entered  

## 2013-03-28 ENCOUNTER — Other Ambulatory Visit (HOSPITAL_COMMUNITY): Payer: Self-pay | Admitting: "Endocrinology

## 2013-03-28 DIAGNOSIS — E049 Nontoxic goiter, unspecified: Secondary | ICD-10-CM

## 2013-04-02 ENCOUNTER — Ambulatory Visit: Payer: Managed Care, Other (non HMO) | Attending: Family Medicine | Admitting: Sleep Medicine

## 2013-04-02 DIAGNOSIS — G4733 Obstructive sleep apnea (adult) (pediatric): Secondary | ICD-10-CM | POA: Insufficient documentation

## 2013-04-02 DIAGNOSIS — G4730 Sleep apnea, unspecified: Secondary | ICD-10-CM

## 2013-04-03 ENCOUNTER — Other Ambulatory Visit: Payer: Self-pay | Admitting: Family Medicine

## 2013-04-03 DIAGNOSIS — G479 Sleep disorder, unspecified: Secondary | ICD-10-CM

## 2013-04-08 NOTE — Procedures (Signed)
[facility] Anel Creighton A. Gerilyn Pilgrim, MD     [email or web address]        Stacy [name], Stacy [name]             ACCOUNT NO.:  [id number]  MEDICAL RECORD NO.:  [id number]          PATIENT TYPE:  OUT  LOCATION:  SLEEP LAB                     FACILITY:  [facility]  PHYSICIAN:  Xyla Leisner A. Gerilyn Pilgrim, M.D. DATE OF BIRTH:  [birth year]  DATE OF STUDY:                           NOCTURNAL POLYSOMNOGRAM  REFERRING PHYSICIAN:  Milus Mallick. Lodema Hong, M.D.  INDICATION:  This is a 52 year old who has a prior study documented obstructive sleep apnea syndrome.  This is a CPAP titration recording.   MEDICATIONS:  Nexium, levothyroxine.  EPWORTH SLEEPINESS SCALE:  15.  BMI:  29.  ARCHITECTURAL SUMMARY:  The total recording time is 375 minutes, sleep efficiency 84%, sleep latency 2 minutes.  REM latency quite early at 7 minutes.  Stage N1 of 10%, N2 of 81% N3 of 1%, and REM sleep 8%.  RESPIRATORY SUMMARY:  Baseline oxygen saturation 98, lowest saturation 88 during non-REM sleep.  The patient was placed on positive pressure starting at 4 and increased to 16.  There are some difficulties with these pressures that snoring persisted despite increasing pressures. The patient was placed on bi-level pressures, which really did not make a difference.  Bi-level pressures were titrated from 12/8 to 14/10.  The patient appears to have done best on pressures between 11 and 13, although, these pressures were not optimal.  LIMB MOVEMENT SUMMARY:  PLM index 0.  ELECTROCARDIOGRAM SUMMARY:  Average heart rate 66 with no significant dysrhythmia was observed.  IMPRESSION:  Obstructive sleep apnea syndrome, which responds best to a CPAP of 11-13.  However there were some residual problems at this Pressure. I recommend the patient be placed on an auto BiPAP at home.    Jayvian Escoe A. Gerilyn Pilgrim, M.D.    KAD/MEDQ  D:  04/08/2013 09:36:22  T:  04/08/2013 09:52:16  Job:  [id number]

## 2013-04-15 ENCOUNTER — Ambulatory Visit (HOSPITAL_COMMUNITY)
Admission: RE | Admit: 2013-04-15 | Discharge: 2013-04-15 | Disposition: A | Discharge status: Home / Self Care | Payer: Managed Care, Other (non HMO) | Source: Ambulatory Visit | Attending: "Endocrinology | Admitting: "Endocrinology

## 2013-04-15 DIAGNOSIS — E049 Nontoxic goiter, unspecified: Secondary | ICD-10-CM

## 2013-04-15 DIAGNOSIS — E041 Nontoxic single thyroid nodule: Secondary | ICD-10-CM | POA: Insufficient documentation

## 2013-04-25 ENCOUNTER — Other Ambulatory Visit (HOSPITAL_COMMUNITY): Payer: Managed Care, Other (non HMO)

## 2013-05-01 ENCOUNTER — Ambulatory Visit (INDEPENDENT_AMBULATORY_CARE_PROVIDER_SITE_OTHER): Payer: Managed Care, Other (non HMO) | Admitting: Family Medicine

## 2013-05-01 ENCOUNTER — Encounter: Payer: Self-pay | Admitting: Family Medicine

## 2013-05-01 VITALS — BP 98/64 | HR 78 | Resp 18 | Wt 188.0 lb

## 2013-05-01 DIAGNOSIS — R7309 Other abnormal glucose: Secondary | ICD-10-CM

## 2013-05-01 DIAGNOSIS — M5090 Cervical disc disorder, unspecified, unspecified cervical region: Secondary | ICD-10-CM

## 2013-05-01 DIAGNOSIS — D649 Anemia, unspecified: Secondary | ICD-10-CM

## 2013-05-01 DIAGNOSIS — E785 Hyperlipidemia, unspecified: Secondary | ICD-10-CM

## 2013-05-01 DIAGNOSIS — E663 Overweight: Secondary | ICD-10-CM

## 2013-05-01 DIAGNOSIS — G4730 Sleep apnea, unspecified: Secondary | ICD-10-CM

## 2013-05-01 DIAGNOSIS — R7303 Prediabetes: Secondary | ICD-10-CM

## 2013-05-01 DIAGNOSIS — E039 Hypothyroidism, unspecified: Secondary | ICD-10-CM

## 2013-05-01 DIAGNOSIS — K219 Gastro-esophageal reflux disease without esophagitis: Secondary | ICD-10-CM

## 2013-05-01 NOTE — Progress Notes (Signed)
   Subjective:    Patient ID: Stacy [name], female    DOB: Jun 23, 1960, 53 y.o.   MRN: [id number]  HPI The PT is here for follow up and re-evaluation of chronic medical conditions, medication management and review of any available recent lab and radiology data.  Preventive health is updated, specifically  Cancer screening and Immunization.   Questions or concerns regarding consultations or procedures which the PT has had in the interim are  Addressed. Still being fully evaluated for her sleep disorder which does require treatment Has not been taking the metformin, and remains inconsistent as afar as lifestyle change ius concerned, will hold off on lab re eval for an additional 3 month     Review of Systems See HPI Denies recent fever or chills. Denies sinus pressure, nasal congestion, ear pain or sore throat. Denies chest congestion, productive cough or wheezing. Denies chest pains, palpitations and leg swelling Denies  nausea, vomiting,diarrhea or constipation.Uncontrolled gERD off of dexilant which is expensive for her , needs a PA   Denies dysuria, frequency, hesitancy or incontinence. Denies joint pain, swelling and limitation in mobility. Denies headaches, seizures, numbness, or tingling. Denies depression, anxiety or insomnia. Denies skin break down or rash.        Objective:   Physical Exam BP 98/64  Pulse 78  Resp 18  Wt 188 lb (85.276 kg)  SpO2 100% Patient alert and oriented and in no cardiopulmonary distress.  HEENT: No facial asymmetry, EOMI, no sinus tenderness,  oropharynx pink and moist.  Neck supple no adenopathy.  Chest: Clear to auscultation bilaterally.  CVS: S1, S2 no murmurs, no S3.  ABD: Soft non tender. Bowel sounds normal.  Ext: No edema  MS: Adequate ROM spine, shoulders, hips and knees.  Skin: Intact, no ulcerations or rash noted.  Psych: Good eye contact, normal affect. Memory intact not anxious or depressed appearing.  CNS: CN  2-12 intact, power, tone and sensation normal throughout.        Assessment & Plan:  Prediabetes Has been non compliant with lifestyle change and metformin, will start medication and be more consistent with lifestyle change needed. Patient educated about the importance of limiting  Carbohydrate intake , the need to commit to daily physical activity for a minimum of 30 minutes , and to commit weight loss. The fact that changes in all these areas will reduce or eliminate all together the development of diabetes is stressed.   Updated lab needed at/ before next visit.   OVERWEIGHT Deteriorated. Patient re-educated about  the importance of commitment to a  minimum of 150 minutes of exercise per week. The importance of healthy food choices with portion control discussed. Encouraged to start a food diary, count calories and to consider  joining a support group. Sample diet sheets offered. Goals set by the patient for the next several months.     Unspecified sleep apnea Being managed by neurology, appropriate management still being decided  DYSLIPIDEMIA Hyperlipidemia:Low fat diet discussed and encouraged.  Updated lab needed at/ before next visit.   Cervical neck pain with evidence of disc disease Improved with  physical therapy  GERD Uncontrolled off dexilant, cost prohibitive, I have advised she request Pa through GI office  Hypothyroidism Controlled and followed by endo

## 2013-05-01 NOTE — Patient Instructions (Addendum)
F/u in 3 month, call i if you need before  Please commit to exercise daily, for 30 minutes at least  Please change eating habits  And take metformin daily  Weight loss goal of 5 pounds  Fasting lipid, cmp , hBA1C , cBc in 3 month  Check GI re prior authorization for your dexilant , we will send in 3 month

## 2013-05-20 ENCOUNTER — Other Ambulatory Visit: Payer: Self-pay | Admitting: Gastroenterology

## 2013-06-14 NOTE — Assessment & Plan Note (Signed)
Hyperlipidemia:Low fat diet discussed and encouraged.  Updated lab needed at/ before next visit.  

## 2013-06-14 NOTE — Assessment & Plan Note (Signed)
Being managed by neurology, appropriate management still being decided

## 2013-06-14 NOTE — Assessment & Plan Note (Signed)
Improved with physical therapy.

## 2013-06-14 NOTE — Assessment & Plan Note (Signed)
Has been non compliant with lifestyle change and metformin, will start medication and be more consistent with lifestyle change needed. Patient educated about the importance of limiting  Carbohydrate intake , the need to commit to daily physical activity for a minimum of 30 minutes , and to commit weight loss. The fact that changes in all these areas will reduce or eliminate all together the development of diabetes is stressed.   Updated lab needed at/ before next visit.

## 2013-06-14 NOTE — Assessment & Plan Note (Signed)
Deteriorated. Patient re-educated about  the importance of commitment to a  minimum of 150 minutes of exercise per week. The importance of healthy food choices with portion control discussed. Encouraged to start a food diary, count calories and to consider  joining a support group. Sample diet sheets offered. Goals set by the patient for the next several months.    

## 2013-06-14 NOTE — Assessment & Plan Note (Signed)
Uncontrolled off dexilant, cost prohibitive, I have advised she request Pa through GI office

## 2013-06-14 NOTE — Assessment & Plan Note (Signed)
Controlled and followed by endo 

## 2013-07-28 ENCOUNTER — Telehealth: Payer: Self-pay | Admitting: Family Medicine

## 2013-07-28 NOTE — Telephone Encounter (Signed)
Lab order faxed and patient aware

## 2013-07-31 ENCOUNTER — Ambulatory Visit: Payer: Managed Care, Other (non HMO) | Admitting: Family Medicine

## 2013-08-01 LAB — CBC WITH DIFFERENTIAL/PLATELET
BASOS PCT: 1 % (ref 0–1)
Basophils Absolute: 0 10*3/uL (ref 0.0–0.1)
Eosinophils Absolute: 0.1 10*3/uL (ref 0.0–0.7)
Eosinophils Relative: 2 % (ref 0–5)
HCT: 34.1 % — ABNORMAL LOW (ref 36.0–46.0)
HEMOGLOBIN: 11.6 g/dL — AB (ref 12.0–15.0)
LYMPHS ABS: 1.2 10*3/uL (ref 0.7–4.0)
Lymphocytes Relative: 41 % (ref 12–46)
MCH: 28.6 pg (ref 26.0–34.0)
MCHC: 34 g/dL (ref 30.0–36.0)
MCV: 84.2 fL (ref 78.0–100.0)
MONOS PCT: 12 % (ref 3–12)
Monocytes Absolute: 0.4 10*3/uL (ref 0.1–1.0)
NEUTROS ABS: 1.4 10*3/uL — AB (ref 1.7–7.7)
Neutrophils Relative %: 44 % (ref 43–77)
Platelets: 166 10*3/uL (ref 150–400)
RBC: 4.05 MIL/uL (ref 3.87–5.11)
RDW: 13.7 % (ref 11.5–15.5)
WBC: 3 10*3/uL — ABNORMAL LOW (ref 4.0–10.5)

## 2013-08-01 LAB — COMPREHENSIVE METABOLIC PANEL
ALT: 21 U/L (ref 0–35)
AST: 19 U/L (ref 0–37)
Albumin: 3.7 g/dL (ref 3.5–5.2)
Alkaline Phosphatase: 88 U/L (ref 39–117)
BILIRUBIN TOTAL: 0.4 mg/dL (ref 0.2–1.2)
BUN: 12 mg/dL (ref 6–23)
CO2: 31 meq/L (ref 19–32)
CREATININE: 0.95 mg/dL (ref 0.50–1.10)
Calcium: 8.9 mg/dL (ref 8.4–10.5)
Chloride: 104 mEq/L (ref 96–112)
GLUCOSE: 92 mg/dL (ref 70–99)
Potassium: 4.2 mEq/L (ref 3.5–5.3)
Sodium: 140 mEq/L (ref 135–145)
Total Protein: 7 g/dL (ref 6.0–8.3)

## 2013-08-01 LAB — LIPID PANEL
Cholesterol: 174 mg/dL (ref 0–200)
HDL: 43 mg/dL (ref >39)
LDL Cholesterol: 108 mg/dL — ABNORMAL HIGH (ref 0–99)
TRIGLYCERIDES: 116 mg/dL (ref <150)
Total CHOL/HDL Ratio: 4 Ratio
VLDL: 23 mg/dL (ref 0–40)

## 2013-08-01 LAB — HEMOGLOBIN A1C
HEMOGLOBIN A1C: 6 % — AB (ref <5.7)
MEAN PLASMA GLUCOSE: 126 mg/dL — AB (ref <117)

## 2013-08-19 ENCOUNTER — Ambulatory Visit (INDEPENDENT_AMBULATORY_CARE_PROVIDER_SITE_OTHER): Payer: Managed Care, Other (non HMO) | Admitting: Family Medicine

## 2013-08-19 ENCOUNTER — Encounter: Payer: Self-pay | Admitting: Family Medicine

## 2013-08-19 VITALS — BP 126/78 | HR 70 | Resp 18 | Ht 67.0 in | Wt 175.0 lb

## 2013-08-19 DIAGNOSIS — E039 Hypothyroidism, unspecified: Secondary | ICD-10-CM

## 2013-08-19 DIAGNOSIS — D72819 Decreased white blood cell count, unspecified: Secondary | ICD-10-CM

## 2013-08-19 DIAGNOSIS — R7303 Prediabetes: Secondary | ICD-10-CM

## 2013-08-19 DIAGNOSIS — E8881 Metabolic syndrome: Secondary | ICD-10-CM

## 2013-08-19 DIAGNOSIS — E785 Hyperlipidemia, unspecified: Secondary | ICD-10-CM

## 2013-08-19 DIAGNOSIS — R7309 Other abnormal glucose: Secondary | ICD-10-CM

## 2013-08-19 DIAGNOSIS — M5090 Cervical disc disorder, unspecified, unspecified cervical region: Secondary | ICD-10-CM

## 2013-08-19 DIAGNOSIS — E663 Overweight: Secondary | ICD-10-CM

## 2013-08-19 NOTE — Patient Instructions (Addendum)
F/u in 4 month, call if you need me before   You will be referred to hermatology in July   Congrats, increase execise and watchful eating  HBA1C, chem 7 , non fast  Call gyne re date of next appt  Sched eye exam  It is important that you exercise regularly at least 30 minutes 5 times a week. If you develop chest pain, have severe difficulty breathing, or feel very tired, stop exercising immediately and seek medical attention   Weight loss goal of 2 to 3 pounds per month

## 2013-08-24 DIAGNOSIS — E8881 Metabolic syndrome: Secondary | ICD-10-CM | POA: Insufficient documentation

## 2013-08-24 NOTE — Assessment & Plan Note (Signed)
Improved. Pt applauded on succesful weight loss through lifestyle change, and encouraged to continue same. Weight loss goal set for the next several months.  

## 2013-08-24 NOTE — Assessment & Plan Note (Signed)
Pt also has mild anemia, has had no formal heme eval on record. Positive f/h of leukopenia, will refer for  Heme eval in 4  month

## 2013-08-24 NOTE — Assessment & Plan Note (Signed)
The increased risk of cardiovascular disease associated with this diagnosis, and the need to consistently work on lifestyle to change this is discussed. Following  a  heart healthy diet ,commitment to 30 minutes of exercise at least 5 days per week, as well as control of blood sugar and cholesterol , and achieving a healthy weight are all the areas to be addressed .  

## 2013-08-24 NOTE — Assessment & Plan Note (Signed)
Slight deterioratin Hyperlipidemia:Low fat diet discussed and encouraged.

## 2013-08-24 NOTE — Assessment & Plan Note (Signed)
Improved, conservative management only by pain specialist

## 2013-08-24 NOTE — Assessment & Plan Note (Signed)
Improved pt applauded on this and will remain on metformin and improve lifestyle change Patient educated about the importance of limiting  Carbohydrate intake , the need to commit to daily physical activity for a minimum of 30 minutes , and to commit weight loss. The fact that changes in all these areas will reduce or eliminate all together the development of diabetes is stressed.

## 2013-08-24 NOTE — Progress Notes (Signed)
   Subjective:    Patient ID: Stacy [name], female    DOB: [birth year], 53 y.o.   MRN: [id number]  HPI The PT is here for follow up and re-evaluation of chronic medical conditions, medication management and review of any available recent lab and radiology data.  Preventive health is updated, specifically  Cancer screening and Immunization.  Will call gyne as toi when her next visit is due Has upcoming endo eval and needs eye exam. The PT denies any adverse reactions to current medications since the last visit.  There are no new concerns.  There are no specific complaints  Vey pleasantly surprised with excellent weight loss, and improved HBa1C Now taking metformin daily , tolerates this well, less sugary snacks      Review of Systems See HPI Denies recent fever or chills. Denies sinus pressure, nasal congestion, ear pain or sore throat. Denies chest congestion, productive cough or wheezing. Denies chest pains, palpitations and leg swelling Denies abdominal pain, nausea, vomiting,diarrhea or constipation.   Denies dysuria, frequency, hesitancy or incontinence. Denies joint pain, swelling and limitation in mobility. Denies headaches, seizures, numbness, or tingling. Denies depression, anxiety or insomnia. Denies skin break down or rash.        Objective:   Physical Exam  BP 126/78  Pulse 70  Resp 18  Ht 5\' 7"  (1.702 m)  Wt 175 lb 0.6 oz (79.398 kg)  BMI 27.41 kg/m2  SpO2 96% Patient alert and oriented and in no cardiopulmonary distress.  HEENT: No facial asymmetry, EOMI, no sinus tenderness,  oropharynx pink and moist.  Neck supple no adenopathy.  Chest: Clear to auscultation bilaterally.  CVS: S1, S2 no murmurs, no S3.  ABD: Soft non tender. Bowel sounds normal.  Ext: No edema  MS: Adequate ROM spine, shoulders, hips and knees.  Skin: Intact, no ulcerations or rash noted.  Psych: Good eye contact, normal affect. Memory intact not anxious or depressed  appearing.  CNS: CN 2-12 intact, power, tone and sensation normal throughout.       Assessment & Plan:

## 2013-08-24 NOTE — Assessment & Plan Note (Signed)
Controlled and followed by endo 

## 2013-12-09 LAB — HM DIABETES EYE EXAM

## 2014-01-23 LAB — HEMOGLOBIN A1C
HEMOGLOBIN A1C: 6.1 % — AB (ref <5.7)
MEAN PLASMA GLUCOSE: 128 mg/dL — AB (ref <117)

## 2014-01-23 LAB — BASIC METABOLIC PANEL
BUN: 14 mg/dL (ref 6–23)
CHLORIDE: 103 meq/L (ref 96–112)
CO2: 30 mEq/L (ref 19–32)
CREATININE: 0.96 mg/dL (ref 0.50–1.10)
Calcium: 9.8 mg/dL (ref 8.4–10.5)
Glucose, Bld: 72 mg/dL (ref 70–99)
Potassium: 3.9 mEq/L (ref 3.5–5.3)
Sodium: 142 mEq/L (ref 135–145)

## 2014-01-29 ENCOUNTER — Other Ambulatory Visit: Payer: Self-pay | Admitting: Family Medicine

## 2014-01-29 ENCOUNTER — Telehealth: Payer: Self-pay | Admitting: Family Medicine

## 2014-01-29 DIAGNOSIS — R7303 Prediabetes: Secondary | ICD-10-CM

## 2014-01-29 MED ORDER — METFORMIN HCL ER 500 MG PO TB24
500.0000 mg | ORAL_TABLET | Freq: Every day | ORAL | Status: DC
Start: 2014-01-29 — End: 2014-08-11

## 2014-01-29 NOTE — Telephone Encounter (Signed)
Med refilled.

## 2014-03-05 ENCOUNTER — Encounter: Payer: Self-pay | Admitting: Family Medicine

## 2014-03-05 ENCOUNTER — Ambulatory Visit (INDEPENDENT_AMBULATORY_CARE_PROVIDER_SITE_OTHER): Payer: Managed Care, Other (non HMO) | Admitting: Family Medicine

## 2014-03-05 VITALS — BP 120/80 | HR 78 | Resp 16 | Ht 67.0 in | Wt 179.8 lb

## 2014-03-05 DIAGNOSIS — R7309 Other abnormal glucose: Secondary | ICD-10-CM

## 2014-03-05 DIAGNOSIS — Z1239 Encounter for other screening for malignant neoplasm of breast: Secondary | ICD-10-CM

## 2014-03-05 DIAGNOSIS — M5090 Cervical disc disorder, unspecified, unspecified cervical region: Secondary | ICD-10-CM

## 2014-03-05 DIAGNOSIS — E663 Overweight: Secondary | ICD-10-CM

## 2014-03-05 DIAGNOSIS — E8881 Metabolic syndrome: Secondary | ICD-10-CM

## 2014-03-05 DIAGNOSIS — R7303 Prediabetes: Secondary | ICD-10-CM

## 2014-03-05 DIAGNOSIS — E039 Hypothyroidism, unspecified: Secondary | ICD-10-CM

## 2014-03-05 DIAGNOSIS — M542 Cervicalgia: Secondary | ICD-10-CM

## 2014-03-05 DIAGNOSIS — M6248 Contracture of muscle, other site: Secondary | ICD-10-CM

## 2014-03-05 DIAGNOSIS — E785 Hyperlipidemia, unspecified: Secondary | ICD-10-CM

## 2014-03-05 DIAGNOSIS — M62838 Other muscle spasm: Secondary | ICD-10-CM

## 2014-03-05 DIAGNOSIS — G479 Sleep disorder, unspecified: Secondary | ICD-10-CM

## 2014-03-05 DIAGNOSIS — Z1211 Encounter for screening for malignant neoplasm of colon: Secondary | ICD-10-CM

## 2014-03-05 DIAGNOSIS — K219 Gastro-esophageal reflux disease without esophagitis: Secondary | ICD-10-CM

## 2014-03-05 DIAGNOSIS — D509 Iron deficiency anemia, unspecified: Secondary | ICD-10-CM

## 2014-03-05 NOTE — Patient Instructions (Addendum)
F/u in 4 month, call if you need me before  HBA1C, fasting lipid, cmp and EGFR, CBC and anemia panel April 12 or after  You will be referred to hematologist next year if blood count is abnormal , as we discussed today   You are referred for PT for chronic neck pain and spasm twice weekly for 6 weeks  Pls get mammogram appt at checkout  OK to take metformin any time of the day that is convenient, please resume , your sugar was better as was your weight loss when you took it regularly  Pls commit to exercise at least 3 days per week  Pls return stool cards that you are given within the next week for colon screening  Pls get your sleep equipment working , so that you protect your heart and lungs and improve the quality of your sleep and your health

## 2014-03-05 NOTE — Progress Notes (Signed)
Subjective:    Patient ID: Stacy [name], female    DOB: 05-03-1960, 53 y.o.   MRN: [id number]  HPI The PT is here for follow up and re-evaluation of chronic medical conditions, medication management and review of any available recent lab and radiology data.  Preventive health is updated, specifically  Cancer screening and Immunization.  Needs colon screen opts to return stool cards Questions or concerns regarding consultations or procedures which the PT has had in the interim are  Addressed.recently saw endo no change in synthroid replacement The PT denies any adverse reactions to current medications since the last visit. Noted GI disturbance with metformin started taking inconsistently There are no new concerns.  There are no specific complaints       Review of Systems See HPI Denies recent fever or chills. Denies sinus pressure, nasal congestion, ear pain or sore throat. Denies chest congestion, productive cough or wheezing. Denies chest pains, palpitations and leg swelling Denies abdominal pain, nausea, vomiting,diarrhea or constipation.   Denies dysuria, frequency, hesitancy or incontinence. C/o neck and left upper  Extremity pain and spasm Denies headaches, seizures, numbness, or tingling. Denies depression, anxiety or insomnia.Sleep is still a problem states her sleep equipment does notfit well and she does not use consistently Not taking metformin daily due to GI upset, has regained weight and blood sugar ahs increased unfortumately Denies skin break down or rash.        Objective:   Physical Exam BP 120/80 mmHg  Pulse 78  Resp 16  Ht 5\' 7"  (1.702 m)  Wt 179 lb 12.8 oz (81.557 kg)  BMI 28.15 kg/m2  SpO2 100% Patient alert and oriented and in no cardiopulmonary distress.  HEENT: No facial asymmetry, EOMI,   oropharynx pink and moist.  Neck decreased ROM with left trapezius spasm, no JVD, no mass.  Chest: Clear to auscultation bilaterally.  CVS: S1, S2 no  murmurs, no S3.Regular rate.  ABD: Soft non tender.   Ext: No edema  MS: Adequate ROM spine, shoulders, hips and knees.  Skin: Intact, no ulcerations or rash noted.  Psych: Good eye contact, normal affect. Memory intact not anxious or depressed appearing.  CNS: CN 2-12 intact, power,  normal throughout.no focal deficits noted.        Assessment & Plan:  Prediabetes Deteriorated, needs to resume daily metformin and commitr to regualr exercise and dietary change Patient educated about the importance of limiting  Carbohydrate intake , the need to commit to daily physical activity for a minimum of 30 minutes , and to commit weight loss. The fact that changes in all these areas will reduce or eliminate all together the development of diabetes is stressed.     Cervical neck pain with evidence of disc disease Good result with epidural, also with Pt will refer for 6 weeks PT due to increased neck spasm  Overweight (BMI 25.0-29.9) Deteriorated. Patient re-educated about  the importance of commitment to a  minimum of 150 minutes of exercise per week. The importance of healthy food choices with portion control discussed. Encouraged to start a food diary, count calories and to consider  joining a support group. Sample diet sheets offered. Goals set by the patient for the next several months.     Dyslipidemia Hyperlipidemia:Low fat diet discussed and encouraged.  Updated lab needed at/ before next visit.   Metabolic syndrome X The increased risk of cardiovascular disease associated with this diagnosis, and the need to consistently work on lifestyle to  change this is discussed. Following  a  heart healthy diet ,commitment to 30 minutes of exercise at least 5 days per week, as well as control of blood sugar and cholesterol , and achieving a healthy weight are all the areas to be addressed .   Sleep disorder Reports that equipment is not fitting well and she does not use regularly,  re educated re the need to have this attended to  Hypothyroidism Controlled and followed by endo  GERD Controlled, no change in medication   Screen for colon cancer Three stool cards given to be returned within 1 week as pt refused in office rectal  Exam, screen is past due

## 2014-03-09 DIAGNOSIS — Z1211 Encounter for screening for malignant neoplasm of colon: Secondary | ICD-10-CM | POA: Insufficient documentation

## 2014-03-09 NOTE — Assessment & Plan Note (Signed)
The increased risk of cardiovascular disease associated with this diagnosis, and the need to consistently work on lifestyle to change this is discussed. Following  a  heart healthy diet ,commitment to 30 minutes of exercise at least 5 days per week, as well as control of blood sugar and cholesterol , and achieving a healthy weight are all the areas to be addressed .  

## 2014-03-09 NOTE — Assessment & Plan Note (Signed)
Deteriorated, needs to resume daily metformin and commitr to regualr exercise and dietary change Patient educated about the importance of limiting  Carbohydrate intake , the need to commit to daily physical activity for a minimum of 30 minutes , and to commit weight loss. The fact that changes in all these areas will reduce or eliminate all together the development of diabetes is stressed.

## 2014-03-09 NOTE — Assessment & Plan Note (Signed)
Reports that equipment is not fitting well and she does not use regularly, re educated re the need to have this attended to

## 2014-03-09 NOTE — Assessment & Plan Note (Signed)
Three stool cards given to be returned within 1 week as pt refused in office rectal  Exam, screen is past due

## 2014-03-09 NOTE — Assessment & Plan Note (Signed)
Deteriorated. Patient re-educated about  the importance of commitment to a  minimum of 150 minutes of exercise per week. The importance of healthy food choices with portion control discussed. Encouraged to start a food diary, count calories and to consider  joining a support group. Sample diet sheets offered. Goals set by the patient for the next several months.    

## 2014-03-09 NOTE — Assessment & Plan Note (Signed)
Controlled, no change in medication  

## 2014-03-09 NOTE — Assessment & Plan Note (Signed)
Controlled and followed by endo 

## 2014-03-09 NOTE — Assessment & Plan Note (Signed)
Hyperlipidemia:Low fat diet discussed and encouraged.  Updated lab needed at/ before next visit.  

## 2014-03-09 NOTE — Assessment & Plan Note (Signed)
Good result with epidural, also with Pt will refer for 6 weeks PT due to increased neck spasm

## 2014-03-12 ENCOUNTER — Ambulatory Visit (HOSPITAL_COMMUNITY)
Admission: RE | Admit: 2014-03-12 | Discharge: 2014-03-12 | Disposition: A | Discharge status: Home / Self Care | Payer: Managed Care, Other (non HMO) | Source: Ambulatory Visit | Attending: Family Medicine | Admitting: Family Medicine

## 2014-03-12 DIAGNOSIS — Z1231 Encounter for screening mammogram for malignant neoplasm of breast: Secondary | ICD-10-CM | POA: Diagnosis present

## 2014-03-12 DIAGNOSIS — Z1239 Encounter for other screening for malignant neoplasm of breast: Secondary | ICD-10-CM

## 2014-04-07 ENCOUNTER — Ambulatory Visit (HOSPITAL_COMMUNITY)
Admission: RE | Admit: 2014-04-07 | Discharge: 2014-04-07 | Disposition: A | Discharge status: Home / Self Care | Payer: Managed Care, Other (non HMO) | Source: Ambulatory Visit | Attending: Family Medicine | Admitting: Family Medicine

## 2014-04-07 DIAGNOSIS — M5090 Cervical disc disorder, unspecified, unspecified cervical region: Secondary | ICD-10-CM | POA: Insufficient documentation

## 2014-04-07 DIAGNOSIS — M436 Torticollis: Secondary | ICD-10-CM | POA: Insufficient documentation

## 2014-04-07 DIAGNOSIS — M6248 Contracture of muscle, other site: Secondary | ICD-10-CM | POA: Diagnosis present

## 2014-04-07 DIAGNOSIS — M62838 Other muscle spasm: Secondary | ICD-10-CM

## 2014-04-07 NOTE — Therapy (Signed)
[facility] [address] [place], [place], [address] Phone: [phone]   Fax:  [phone]  Physical Therapy Evaluation  Patient Details  Name: Stacy [name] MRN: [id number] Date of Birth: 05/21/1960  Encounter Date: 04/07/2014      PT End of Session - 04/07/14 1643    Visit Number 1   Number of Visits 16   Date for PT Re-Evaluation 05/07/14   Authorization Type AETNA   PT Start Time 1600   PT Stop Time 1635   PT Time Calculation (min) 35 min   Activity Tolerance Patient tolerated treatment well      Past Medical History  Diagnosis Date  . GERD (gastroesophageal reflux disease)   . Onychomycosis     Left great toe  . Lactose intolerance   . Hashimoto thyroiditis   . Hyperlipidemia     Past Surgical History  Procedure Laterality Date  . Dilation and curettage of uterus      Seconday to miscarriage  . Knee arthroscopy  2003    right knee  . Colonoscopy  04/04/2011    Internal hemorrhoids/Nl colonoscopy-SLIGHTLY TORTUOUS COLON  . Esophagogastroduodenoscopy  09/22/2011    Mild gastritis/Hiatal hernia/DYSPHAGIA DUE TO PEPTIC STRICTURE/UNCONTROLLED GERD  . Savory dilation  09/22/2011    Procedure: SAVORY DILATION;  Surgeon: Danie Binder, MD;  [facility];  Service: Endoscopy;  Laterality: N/A;  Venia Minks dilation  09/22/2011    Procedure: MALONEY DILATION;  Surgeon: Danie Binder, MD;  Location: [facility];  Service: Endoscopy;  Laterality: N/A;    There were no vitals taken for this visit.  Visit Diagnosis:  Muscle spasms of head and/or neck  Cervical neck pain with evidence of disc disease  Stiffness of cervical spine      Subjective Assessment - 04/07/14 1606    Symptoms Pt states she has been having neck pain for years.  She recieved injections in her neck about a year ago and she has relief for several months.  The pain started retuning in August.  She has been going to a massage therapist but her pain  has continued therefore she has been referred to therapy.  She is experiencing H/A a couple times a week.  She has greater pain in the morining.    Currently in Pain? Yes   Pain Score 0-No pain  this week 4/10    Pain Location Neck   Pain Orientation Left   Pain Descriptors / Indicators Aching   Pain Type Chronic pain   Pain Radiating Towards Lt shoulder    Pain Onset More than a month ago   Pain Frequency Intermittent   Pain Relieving Factors massage; pulling on neck    Effect of Pain on Daily Activities increases after a while           [facility] PT Assessment - 04/07/14 1613    Assessment   Medical Diagnosis cervical pain    Onset Date 11/22/13   Prior Therapy 2014   Precautions   Precautions None   Balance Screen   Has the patient fallen in the past 6 months No   Has the patient had a decrease in activity level because of a fear of falling?  No   Is the patient reluctant to leave their home because of a fear of falling?  No   Prior Function   Level of Independence Independent with basic ADLs   Vocation Full time employment   Medical illustrator  Leisure walk     Observation/Other Assessments   Observations --  pt has moderate mm spasm B mid trap area    Focus on Therapeutic Outcomes (FOTO)  73   Posture/Postural Control   Posture/Postural Control No significant limitations   AROM   Cervical Flexion wnl   Cervical Extension wnl reps improve    Cervical - Right Side Bend wnl no problem    Cervical - Left Side Bend decreased 20% discomfort on Rt side    Cervical - Right Rotation decreased 10%   Cervical - Left Rotation decreased 35%   Strength   Cervical Extension 5/5   Cervical - Right Side Bend --  4-/5   Cervical - Left Side Bend --  4-/5          [facility] Adult PT Treatment/Exercise - 04/07/14 1613    Exercises   Exercises Neck   Neck Exercises: Seated   Cervical Isometrics Right lateral flexion;Left lateral flexion;3 secs;5 reps   Neck  Retraction 10 reps   Shoulder Shrugs 5 reps   Shoulder Shrugs Limitations up, back and relax use as a relaxation exercise   Other Seated Exercise cervical excursion 3D x 3             PT Short Term Goals - 04/07/14 1652    PT SHORT TERM GOAL #1   Title I in HEP   Time 1   Period Weeks   PT SHORT TERM GOAL #2   Title Pt to not have pain into shouder region   Time 3   Period Weeks   PT SHORT TERM GOAL #3   Title Pt to no longer have headaches   Time 3   Period Weeks   PT SHORT TERM GOAL #4   Title ROM to be wnl to allow safer driving    Time 3   Period Weeks          PT Long Term Goals - 04/07/14 1653    PT LONG TERM GOAL #1   Title I in advance HEP   Time 4   Period Weeks   PT LONG TERM GOAL #2   Title Pt strength to be 5/5 to allow decreased pain    Time 6   Period Weeks   PT LONG TERM GOAL #3   Title Pt to have been pain free for one week   Time 6   Period Weeks          Plan - 04/07/14 1653    Pt will benefit from skilled therapeutic intervention in order to improve on the following deficits Decreased strength;Decreased range of motion;Pain;Increased muscle spasms;Impaired flexibility   Rehab Potential Good   PT Frequency 2x / week   PT Duration 6 weeks   PT Treatment/Interventions Manual techniques;Therapeutic exercise;Therapeutic activities;Traction;Patient/family education;Moist Heat   PT Next Visit Plan begin neck flexibility stretches, w back, x to v, t-band for postural control  and trial of cervical traction    PT Home Exercise Plan given           Problem List Patient Active Problem List   Diagnosis Date Noted  . Screen for colon cancer 03/09/2014  . Metabolic syndrome X 69/48/5462  . Sleep disorder 02/01/2013  . Prediabetes 01/30/2013  . Muscle spasms of head and/or neck 11/14/2012  . OA (osteoarthritis) of knee 10/03/2012  . Right knee pain 10/03/2012  . Cervical neck pain with evidence of disc disease 09/24/2012  . Pain in  right knee  09/24/2012  . Thyroid nodule 05/09/2012  . Hypothyroidism 09/21/2011  . Ovarian cyst, left 04/27/2011  . ABNORMAL ELECTROCARDIOGRAM 03/04/2010  . CHEST PAIN UNSPECIFIED 12/03/2009  . Anemia 09/15/2009  . LEUKOPENIA, CHRONIC 09/15/2009  . FATIGUE 09/15/2009  . Overweight (BMI 25.0-29.9) 04/24/2009  . ONYCHOMYCOSIS, TOENAILS 05/23/2007  . Dyslipidemia 05/23/2007  . GERD 05/23/2007  Azucena Freed PT/CLT [phone]  04/07/2014, 5:01 PM

## 2014-04-07 NOTE — Patient Instructions (Signed)
Strengthening: Lateral Flexion - Resisted, Beginning to End Range   Facing forward, fingertips on right temple, head side bent away, tilt head back toward other shoulder. Give light resistance. Hold ____ seconds. Repeat ____ times per set. Do ____ sets per session. Do ____ sessions per day.  http://orth.exer.us/334   Copyright  VHI. All rights reserved.  Strengthening: Shoulder Shrug (Phase 1)   Shrug shoulders up and down, forward and backward. Repeat ____ times per set. Do ____ sets per session. Do ____ sessions per day.  http://orth.exer.us/336   Copyright  VHI. All rights reserved.  Flexibility: Neck Retraction   Pull head straight back, keeping eyes and jaw level. Repeat ____ times per set. Do ____ sets per session. Do ____ sessions per day.  http://orth.exer.us/344   Copyright  VHI. All rights reserved.

## 2014-04-07 NOTE — Addendum Note (Signed)
Encounter addended by: Leeroy Cha, PT on: 04/07/2014  5:20 PM<BR>     Documentation filed: Charges VN, Flowsheet VN

## 2014-04-09 ENCOUNTER — Ambulatory Visit (HOSPITAL_COMMUNITY)
Admission: RE | Admit: 2014-04-09 | Discharge: 2014-04-09 | Disposition: A | Discharge status: Home / Self Care | Payer: Managed Care, Other (non HMO) | Source: Ambulatory Visit | Attending: Family Medicine | Admitting: Family Medicine

## 2014-04-09 DIAGNOSIS — M5090 Cervical disc disorder, unspecified, unspecified cervical region: Secondary | ICD-10-CM

## 2014-04-09 DIAGNOSIS — M6248 Contracture of muscle, other site: Secondary | ICD-10-CM | POA: Diagnosis not present

## 2014-04-09 DIAGNOSIS — M436 Torticollis: Secondary | ICD-10-CM

## 2014-04-09 DIAGNOSIS — M62838 Other muscle spasm: Secondary | ICD-10-CM

## 2014-04-09 NOTE — Therapy (Signed)
[facility] [address] [place], [place], [address] Phone: [phone]   Fax:  [phone]  Physical Therapy Treatment  Patient Details  Name: Stacy [name] MRN: [id number] Date of Birth: 1960-11-16  Encounter Date: 04/09/2014      PT End of Session - 04/09/14 1305    Visit Number 2   Number of Visits 16   Date for PT Re-Evaluation 05/07/14   Authorization Type AETNA   PT Start Time 1245   PT Stop Time 1315   PT Time Calculation (min) 30 min   Activity Tolerance Patient tolerated treatment well   Behavior During Therapy [facility] for tasks assessed/performed      Past Medical History  Diagnosis Date  . GERD (gastroesophageal reflux disease)   . Onychomycosis     Left great toe  . Lactose intolerance   . Hashimoto thyroiditis   . Hyperlipidemia     Past Surgical History  Procedure Laterality Date  . Dilation and curettage of uterus      Seconday to miscarriage  . Knee arthroscopy  2003    right knee  . Colonoscopy  04/04/2011    Internal hemorrhoids/Nl colonoscopy-SLIGHTLY TORTUOUS COLON  . Esophagogastroduodenoscopy  09/22/2011    Mild gastritis/Hiatal hernia/DYSPHAGIA DUE TO PEPTIC STRICTURE/UNCONTROLLED GERD  . Savory dilation  09/22/2011    Procedure: SAVORY DILATION;  Surgeon: Danie Binder, MD;  [facility];  Service: Endoscopy;  Laterality: N/A;  Venia Minks dilation  09/22/2011    Procedure: MALONEY DILATION;  Surgeon: Danie Binder, MD;  [facility];  Service: Endoscopy;  Laterality: N/A;    There were no vitals taken for this visit.  Visit Diagnosis:  Muscle spasms of head and/or neck  Cervical neck pain with evidence of disc disease  Stiffness of cervical spine      Subjective Assessment - 04/09/14 1302    Symptoms Pt showed 15 minutes late for appointment.  States she is not hurting today, only feels some tightness.  Pt reports compliance with HEP given 2 days ago.    Currently in Pain?  No/denies            [facility] Adult PT Treatment/Exercise - 04/09/14 1249    Neck Exercises: Seated   Cervical Isometrics Right lateral flexion;Left lateral flexion;5 reps;3 secs   Neck Retraction 10 reps   X to V 10 reps   W Back 10 reps   Shoulder Shrugs 10 reps   Shoulder Shrugs Limitations up, back and relax use as a relaxation exercise   Other Seated Exercise cervical excursion 3D x 3    Modalities   Modalities Moist Heat   Moist Heat Therapy   Number Minutes Moist Heat 15 Minutes   Moist Heat Location Other (comment)  cervical            PT Short Term Goals - 04/09/14 1308    PT SHORT TERM GOAL #1   Title I in HEP   Time 1   Period Weeks   PT SHORT TERM GOAL #2   Title Pt to not have pain into shouder region   Time 3   Period Weeks   PT SHORT TERM GOAL #3   Title Pt to no longer have headaches   Time 3   Period Weeks   PT SHORT TERM GOAL #4   Title ROM to be wnl to allow safer driving    Time 3   Period Weeks  PT Long Term Goals - 04/09/14 1309    PT LONG TERM GOAL #1   Title I in advance HEP   Time 4   Period Weeks   PT LONG TERM GOAL #2   Title Pt strength to be 5/5 to allow decreased pain    Time 6   Period Weeks   PT LONG TERM GOAL #3   Title Pt to have been pain free for one week   Time 6   Period Weeks          Plan - 04/09/14 1305    Clinical Impression Statement Pt able to recall and complete established HEP without cues. Pt instructed with W-back and X-V today per PT POC.  Unable to attempt traction today (due to technical difficulties), however patient was symptom free except for reported tightness through upper traps and cervical musculature.  Concluded session with MHP to help loosen muscles.  Pt reported no soreness at end of session.    Pt will benefit from skilled therapeutic intervention in order to improve on the following deficits Decreased strength;Decreased range of motion;Pain;Increased muscle spasms;Impaired  flexibility   PT Next Visit Plan Progress therex and add postural 3 theraband exercises.  May attempt manual traction (or mechanical when repaired) if needed for pain and radiating symptoms.      PT Home Exercise Plan given         Problem List Patient Active Problem List   Diagnosis Date Noted  . Screen for colon cancer 03/09/2014  . Metabolic syndrome X 63/84/6659  . Sleep disorder 02/01/2013  . Prediabetes 01/30/2013  . Muscle spasms of head and/or neck 11/14/2012  . OA (osteoarthritis) of knee 10/03/2012  . Right knee pain 10/03/2012  . Cervical neck pain with evidence of disc disease 09/24/2012  . Pain in right knee 09/24/2012  . Thyroid nodule 05/09/2012  . Hypothyroidism 09/21/2011  . Ovarian cyst, left 04/27/2011  . ABNORMAL ELECTROCARDIOGRAM 03/04/2010  . CHEST PAIN UNSPECIFIED 12/03/2009  . Anemia 09/15/2009  . LEUKOPENIA, CHRONIC 09/15/2009  . FATIGUE 09/15/2009  . Overweight (BMI 25.0-29.9) 04/24/2009  . ONYCHOMYCOSIS, TOENAILS 05/23/2007  . Dyslipidemia 05/23/2007  . GERD 05/23/2007    Teena Irani, PTA/CLT [phone] 04/09/2014, 1:09 PM

## 2014-04-14 ENCOUNTER — Ambulatory Visit (HOSPITAL_COMMUNITY): Payer: Managed Care, Other (non HMO) | Admitting: Physical Therapy

## 2014-04-16 ENCOUNTER — Ambulatory Visit (HOSPITAL_COMMUNITY): Payer: Managed Care, Other (non HMO)

## 2014-04-28 ENCOUNTER — Ambulatory Visit (HOSPITAL_COMMUNITY): Payer: Managed Care, Other (non HMO) | Admitting: Physical Therapy

## 2014-04-30 ENCOUNTER — Ambulatory Visit (HOSPITAL_COMMUNITY): Payer: Managed Care, Other (non HMO) | Admitting: Physical Therapy

## 2014-04-30 DIAGNOSIS — M436 Torticollis: Secondary | ICD-10-CM | POA: Insufficient documentation

## 2014-04-30 DIAGNOSIS — M6248 Contracture of muscle, other site: Secondary | ICD-10-CM | POA: Insufficient documentation

## 2014-04-30 DIAGNOSIS — M5090 Cervical disc disorder, unspecified, unspecified cervical region: Secondary | ICD-10-CM | POA: Insufficient documentation

## 2014-05-05 ENCOUNTER — Ambulatory Visit (HOSPITAL_COMMUNITY)
Admission: RE | Admit: 2014-05-05 | Discharge: 2014-05-05 | Disposition: A | Discharge status: Home / Self Care | Payer: Managed Care, Other (non HMO) | Source: Ambulatory Visit | Attending: Family Medicine | Admitting: Family Medicine

## 2014-05-06 NOTE — Therapy (Signed)
[facility] [place], [place], [address] Phone: [phone]   Fax:  [phone]  Patient Details  Name: Stacy [name] MRN: [id number] Date of Birth: [birth year] Referring Provider:  Fayrene Helper, MD  Encounter Date: 05/05/2014   PHYSICAL THERAPY DISCHARGE SUMMARY  Visits from Start of Care: 2 Current functional level related to goals / functional outcomes: Unable to assess secondary to pt not returning    Remaining deficits: Unable to assess   Education / Equipment: Given HEP  Plan: Patient agrees to discharge.  Patient goals were not met. Patient is being discharged due to not returning since the last visit.  ?????       Cheick Suhr,CINDY 05/06/2014, 1:27 PM  [facility] [address] [place], [place], [address] Phone: [phone]   Fax:  [phone]

## 2014-05-07 ENCOUNTER — Ambulatory Visit (HOSPITAL_COMMUNITY): Payer: Managed Care, Other (non HMO) | Admitting: Physical Therapy

## 2014-05-12 ENCOUNTER — Encounter (HOSPITAL_COMMUNITY): Payer: Managed Care, Other (non HMO) | Admitting: Physical Therapy

## 2014-05-14 ENCOUNTER — Encounter (HOSPITAL_COMMUNITY): Payer: Managed Care, Other (non HMO) | Admitting: Physical Therapy

## 2014-05-19 ENCOUNTER — Encounter (HOSPITAL_COMMUNITY): Payer: Managed Care, Other (non HMO) | Admitting: Physical Therapy

## 2014-05-21 ENCOUNTER — Encounter (HOSPITAL_COMMUNITY): Payer: Managed Care, Other (non HMO) | Admitting: Physical Therapy

## 2014-05-26 ENCOUNTER — Encounter (HOSPITAL_COMMUNITY): Payer: Managed Care, Other (non HMO) | Admitting: Physical Therapy

## 2014-05-28 ENCOUNTER — Encounter (HOSPITAL_COMMUNITY): Payer: Managed Care, Other (non HMO) | Admitting: Physical Therapy

## 2014-08-11 ENCOUNTER — Encounter: Payer: Self-pay | Admitting: Family Medicine

## 2014-08-11 ENCOUNTER — Ambulatory Visit (INDEPENDENT_AMBULATORY_CARE_PROVIDER_SITE_OTHER): Payer: BLUE CROSS/BLUE SHIELD | Admitting: Family Medicine

## 2014-08-11 VITALS — BP 124/80 | HR 89 | Resp 16 | Ht 67.0 in | Wt 183.0 lb

## 2014-08-11 DIAGNOSIS — G479 Sleep disorder, unspecified: Secondary | ICD-10-CM

## 2014-08-11 DIAGNOSIS — N939 Abnormal uterine and vaginal bleeding, unspecified: Secondary | ICD-10-CM

## 2014-08-11 DIAGNOSIS — J029 Acute pharyngitis, unspecified: Secondary | ICD-10-CM

## 2014-08-11 DIAGNOSIS — M25571 Pain in right ankle and joints of right foot: Secondary | ICD-10-CM

## 2014-08-11 DIAGNOSIS — E8881 Metabolic syndrome: Secondary | ICD-10-CM

## 2014-08-11 DIAGNOSIS — E041 Nontoxic single thyroid nodule: Secondary | ICD-10-CM

## 2014-08-11 DIAGNOSIS — K219 Gastro-esophageal reflux disease without esophagitis: Secondary | ICD-10-CM

## 2014-08-11 DIAGNOSIS — E039 Hypothyroidism, unspecified: Secondary | ICD-10-CM

## 2014-08-11 DIAGNOSIS — E663 Overweight: Secondary | ICD-10-CM

## 2014-08-11 DIAGNOSIS — E785 Hyperlipidemia, unspecified: Secondary | ICD-10-CM

## 2014-08-11 DIAGNOSIS — R7303 Prediabetes: Secondary | ICD-10-CM

## 2014-08-11 DIAGNOSIS — N938 Other specified abnormal uterine and vaginal bleeding: Secondary | ICD-10-CM

## 2014-08-11 DIAGNOSIS — J358 Other chronic diseases of tonsils and adenoids: Secondary | ICD-10-CM

## 2014-08-11 DIAGNOSIS — R7309 Other abnormal glucose: Secondary | ICD-10-CM

## 2014-08-11 DIAGNOSIS — N920 Excessive and frequent menstruation with regular cycle: Secondary | ICD-10-CM

## 2014-08-11 DIAGNOSIS — D649 Anemia, unspecified: Secondary | ICD-10-CM

## 2014-08-11 LAB — POCT RAPID STREP A (OFFICE): Rapid Strep A Screen: NEGATIVE

## 2014-08-11 MED ORDER — IBUPROFEN 800 MG PO TABS: 800.0000 mg | ORAL_TABLET | Freq: Two times a day (BID) | ORAL | Status: DC

## 2014-08-11 MED ORDER — PENICILLIN V POTASSIUM 500 MG PO TABS: 500.0000 mg | ORAL_TABLET | Freq: Three times a day (TID) | ORAL | Status: DC

## 2014-08-11 MED ORDER — DEXLANSOPRAZOLE 60 MG PO CPDR: 1.0000 | DELAYED_RELEASE_CAPSULE | Freq: Every day | ORAL | Status: DC

## 2014-08-11 NOTE — Progress Notes (Signed)
Subjective:    Patient ID: Stacy [name], female    DOB: 09-01-1960, 54 y.o.   MRN: [id number]  HPI 1 week h/o painful swollen tonsil , no fever, tender right glands Weeklly vaginal bleeding 2 days each week for the past 4 to 6 weeks, cramps , with bleeding approx 30% of expected  Progressive worseingn of brittle nails, breaking hair, dry skin , loss of hair, always cold Localized ankle pain worse in past  2.5 months, unable to wear heels, , recall of injury 6 months ago F/u chronic conditions  Not tolerant of metformin, not taking regularly, not exercising more than 3 days per week, has reduced intake of sweets and continues to work on this   Review of Systems    See HPI Denies recent fever or chills. Denies sinus pressure, nasal congestion, ear pain or sore throat. Denies chest congestion, productive cough or wheezing. Denies chest pains, palpitations and leg swelling C/o  abdominal pain, nausea, needs GERD medication, denies vomiting,diarrhea or constipation.   Denies dysuria, frequency, hesitancy or incontinence. . Denies headaches, seizures, numbness, or tingling. Denies depression, anxiety or insomnia.    Objective:   Physical Exam  BP 124/80 mmHg  Pulse 89  Resp 16  Ht 5\' 7"  (1.702 m)  Wt 183 lb (83.008 kg)  BMI 28.66 kg/m2  SpO2 97%  Patient alert and oriented and in no cardiopulmonary distress.  HEENT: No facial asymmetry, EOMI,   oropharynx pink and moist.  Neck supple no JVD, right anterior cervical adenitis. Exudate on right tonsil.TM clear, sinuses non tender  Chest: Clear to auscultation bilaterally.  CVS: S1, S2 no murmurs, no S3.Regular rate.  ABD: Soft non tender., mild epigastric tenderness, no guarding    Ext: No edema  MS: Adequate ROM spine, shoulders, hips and knees.Full ROM  of both ankles, localized tenderness on  lateral aspect right ankle.  Skin: Intact, no ulcerations or rash noted.  Psych: Good eye contact, normal affect.  Memory intact not anxious or depressed appearing.  CNS: CN 2-12 intact, power,  normal throughout.no focal deficits noted.        Assessment & Plan:  Hypothyroidism Inadequately treated, pt seems inadvertently to have been taking half the prescribed dpose, she is to check dose of tablet actually taking and get back in touch Symptomatic for under correction    Sleep disorder Reports improved wellbeing since definitive well being, she also reports compliance Supervised by neurology   Overweight (BMI 25.0-29.9) Deteriorated. Patient re-educated about  the importance of commitment to a  minimum of 150 minutes of exercise per week.  The importance of healthy food choices with portion control discussed. Encouraged to start a food diary, count calories and to consider  joining a support group. Sample diet sheets offered. Goals set by the patient for the next several months.   Weight /BMI 08/11/2014 03/05/2014 08/19/2013  WEIGHT 183 lb 179 lb 12.8 oz 175 lb 0.6 oz  HEIGHT 5\' 7"  5\' 7"  5\' 7"   BMI 28.66 kg/m2 28.15 kg/m2 27.41 kg/m2    Current exercise per week 60 minutes.    Metabolic syndrome X The increased risk of cardiovascular disease associated with this diagnosis, and the need to consistently work on lifestyle to change this is discussed. Following  a  heart healthy diet ,commitment to 30 minutes of exercise at least 5 days per week, as well as control of blood sugar and cholesterol , and achieving a healthy weight are all the areas to be  addressed .    Dyslipidemia Deteriorated Will attend nutrtion counseling for help with healthy food choice  Updated lab needed at/ before next visit.    Prediabetes Improved, ad not taking metformin regularly, intolerant Patient educated about the importance of limiting  Carbohydrate intake , the need to commit to daily physical activity for a minimum of 30 minutes , and to commit weight loss. The fact that changes in all these areas  will reduce or eliminate all together the development of diabetes is stressed.   Diabetic Labs Latest Ref Rng 08/11/2014 01/22/2014 08/01/2013 01/21/2013 05/15/2012  HbA1c <5.7 % 6.0(H) 6.1(H) 6.0(H) 6.3(H) 5.9(H)  Chol 0 - 200 mg/dL 205(H) - 174 - 166  HDL >=46 mg/dL 50 - 43 - 41  Calc LDL 0 - 99 mg/dL 134(H) - 108(H) - 102(H)  Triglycerides <150 mg/dL 107 - 116 - 116  Creatinine 0.50 - 1.10 mg/dL 0.97 0.96 0.95 0.98 0.94   BP/Weight 08/11/2014 03/05/2014 08/19/2013 05/01/2013 03/05/2013 02/06/2013 35/06/6142  Systolic BP 315 400 867 98 - 619 509  Diastolic BP 80 80 78 64 - 65 74  Wt. (Lbs) 183 179.8 175.04 188 186 184.2 186  BMI 28.66 28.15 27.41 29.44 29.12 28.84 29.12   Foot/eye exam completion dates Latest Ref Rng 12/09/2013  Eye Exam No Retinopathy No Retinopathy  Foot Form Completion - -        Thyroid nodule Followed by endo, nedes to schedule and keep follow up, will refer   GERD Symptomatic as has not filled her med , despite this being sent to he pharmacy by GI for 1 year, will re send,she is aware   Polymenorrhea New onset weekly  vaginal bleed x 2 days, light but painful, gyne to further eval and manage, referral entered   Tonsillar exudate 1 week h/o pain and swelling with exudate on right tonsil Rapid strep is negative, treat for presumed strep throat, pt to cal if worsens or does not resolve , she will be referred to ENT on urgent basis if this is the case   Pain in joint, ankle and foot 6 month h/o worsening right ankle pain following trauma involving "twisting it" Xray ankle then short sharp course anti inflammatories by mouth and PT twice weekly for 6 weeks. If noyt improving or worsenigcall for ortho eval and management

## 2014-08-11 NOTE — Patient Instructions (Signed)
F/u in 4 month, call if you need me before  Penicillin is prescribed for 10 days for presumed strep infection of right tonsil, CALL in 2 days if no better I will refer you toENT, also if you are worsening  For right ankle pain, xray today, Ibuprofen 800 mg twice daily as needed for 10 days, and you will be referred to physical therapy also  Labs today  You are referred to Dr Garwin Brothers for irregular menstrual bleeding  You are referred to nutritionist, very important that you go  It is important that you exercise regularly at least 30 minutes 5 times a week. If you develop chest pain, have severe difficulty breathing, or feel very tired, stop exercising immediately and seek medical attention  Glad slepp is better with the machine  Thanks for choosing [facility], we consider it a privelige to serve you.

## 2014-08-12 LAB — COMPLETE METABOLIC PANEL WITH GFR
ALK PHOS: 64 U/L (ref 39–117)
ALT: 23 U/L (ref 0–35)
AST: 20 U/L (ref 0–37)
Albumin: 4 g/dL (ref 3.5–5.2)
BUN: 11 mg/dL (ref 6–23)
CALCIUM: 9.5 mg/dL (ref 8.4–10.5)
CO2: 29 mEq/L (ref 19–32)
CREATININE: 0.97 mg/dL (ref 0.50–1.10)
Chloride: 105 mEq/L (ref 96–112)
GFR, Est African American: 77 mL/min
GFR, Est Non African American: 66 mL/min
Glucose, Bld: 86 mg/dL (ref 70–99)
Potassium: 4.3 mEq/L (ref 3.5–5.3)
Sodium: 141 mEq/L (ref 135–145)
Total Bilirubin: 0.6 mg/dL (ref 0.2–1.2)
Total Protein: 7.4 g/dL (ref 6.0–8.3)

## 2014-08-12 LAB — CBC
HEMATOCRIT: 35.7 % — AB (ref 36.0–46.0)
Hemoglobin: 12 g/dL (ref 12.0–15.0)
MCH: 28.8 pg (ref 26.0–34.0)
MCHC: 33.6 g/dL (ref 30.0–36.0)
MCV: 85.8 fL (ref 78.0–100.0)
MPV: 12.1 fL (ref 8.6–12.4)
Platelets: 168 10*3/uL (ref 150–400)
RBC: 4.16 MIL/uL (ref 3.87–5.11)
RDW: 13.6 % (ref 11.5–15.5)
WBC: 3.3 10*3/uL — AB (ref 4.0–10.5)

## 2014-08-12 LAB — FERRITIN: FERRITIN: 31 ng/mL (ref 10–291)

## 2014-08-12 LAB — IRON: Iron: 122 ug/dL (ref 42–145)

## 2014-08-12 LAB — LIPID PANEL
CHOL/HDL RATIO: 4.1 ratio
Cholesterol: 205 mg/dL — ABNORMAL HIGH (ref 0–200)
HDL: 50 mg/dL (ref >=46)
LDL Cholesterol: 134 mg/dL — ABNORMAL HIGH (ref 0–99)
Triglycerides: 107 mg/dL (ref <150)
VLDL: 21 mg/dL (ref 0–40)

## 2014-08-12 LAB — FOLATE

## 2014-08-12 LAB — MAGNESIUM: MAGNESIUM: 1.9 mg/dL (ref 1.5–2.5)

## 2014-08-12 LAB — HEMOGLOBIN A1C
Hgb A1c MFr Bld: 6 % — ABNORMAL HIGH (ref <5.7)
Mean Plasma Glucose: 126 mg/dL — ABNORMAL HIGH (ref <117)

## 2014-08-12 LAB — TSH: TSH: 5.087 u[IU]/mL — AB (ref 0.350–4.500)

## 2014-08-12 LAB — VITAMIN B12: Vitamin B-12: 1382 pg/mL — ABNORMAL HIGH (ref 211–911)

## 2014-08-13 ENCOUNTER — Encounter: Payer: Self-pay | Admitting: Family Medicine

## 2014-08-13 DIAGNOSIS — J358 Other chronic diseases of tonsils and adenoids: Secondary | ICD-10-CM

## 2014-08-13 DIAGNOSIS — N920 Excessive and frequent menstruation with regular cycle: Secondary | ICD-10-CM | POA: Insufficient documentation

## 2014-08-13 DIAGNOSIS — M25579 Pain in unspecified ankle and joints of unspecified foot: Secondary | ICD-10-CM | POA: Insufficient documentation

## 2014-08-13 HISTORY — DX: Other chronic diseases of tonsils and adenoids: J35.8

## 2014-08-13 NOTE — Assessment & Plan Note (Signed)
Reports improved wellbeing since definitive well being, she also reports compliance Supervised by neurology

## 2014-08-13 NOTE — Assessment & Plan Note (Signed)
Inadequately treated, pt seems inadvertently to have been taking half the prescribed dpose, she is to check dose of tablet actually taking and get back in touch Symptomatic for under correction

## 2014-08-13 NOTE — Assessment & Plan Note (Signed)
6 month h/o worsening right ankle pain following trauma involving "twisting it" Xray ankle then short sharp course anti inflammatories by mouth and PT twice weekly for 6 weeks. If noyt improving or worsenigcall for ortho eval and management

## 2014-08-13 NOTE — Assessment & Plan Note (Signed)
Improved, ad not taking metformin regularly, intolerant Patient educated about the importance of limiting  Carbohydrate intake , the need to commit to daily physical activity for a minimum of 30 minutes , and to commit weight loss. The fact that changes in all these areas will reduce or eliminate all together the development of diabetes is stressed.   Diabetic Labs Latest Ref Rng 08/11/2014 01/22/2014 08/01/2013 01/21/2013 05/15/2012  HbA1c <5.7 % 6.0(H) 6.1(H) 6.0(H) 6.3(H) 5.9(H)  Chol 0 - 200 mg/dL 205(H) - 174 - 166  HDL >=46 mg/dL 50 - 43 - 41  Calc LDL 0 - 99 mg/dL 134(H) - 108(H) - 102(H)  Triglycerides <150 mg/dL 107 - 116 - 116  Creatinine 0.50 - 1.10 mg/dL 0.97 0.96 0.95 0.98 0.94   BP/Weight 08/11/2014 03/05/2014 08/19/2013 05/01/2013 03/05/2013 02/06/2013 65/10/8467  Systolic BP 629 528 413 98 - 244 010  Diastolic BP 80 80 78 64 - 65 74  Wt. (Lbs) 183 179.8 175.04 188 186 184.2 186  BMI 28.66 28.15 27.41 29.44 29.12 28.84 29.12   Foot/eye exam completion dates Latest Ref Rng 12/09/2013  Eye Exam No Retinopathy No Retinopathy  Foot Form Completion - -

## 2014-08-13 NOTE — Assessment & Plan Note (Signed)
The increased risk of cardiovascular disease associated with this diagnosis, and the need to consistently work on lifestyle to change this is discussed. Following  a  heart healthy diet ,commitment to 30 minutes of exercise at least 5 days per week, as well as control of blood sugar and cholesterol , and achieving a healthy weight are all the areas to be addressed .  

## 2014-08-13 NOTE — Assessment & Plan Note (Signed)
Symptomatic as has not filled her med , despite this being sent to he pharmacy by GI for 1 year, will re send,she is aware

## 2014-08-13 NOTE — Assessment & Plan Note (Signed)
New onset weekly  vaginal bleed x 2 days, light but painful, gyne to further eval and manage, referral entered

## 2014-08-13 NOTE — Assessment & Plan Note (Signed)
1 week h/o pain and swelling with exudate on right tonsil Rapid strep is negative, treat for presumed strep throat, pt to cal if worsens or does not resolve , she will be referred to ENT on urgent basis if this is the case

## 2014-08-13 NOTE — Assessment & Plan Note (Signed)
Deteriorated. Patient re-educated about  the importance of commitment to a  minimum of 150 minutes of exercise per week.  The importance of healthy food choices with portion control discussed. Encouraged to start a food diary, count calories and to consider  joining a support group. Sample diet sheets offered. Goals set by the patient for the next several months.   Weight /BMI 08/11/2014 03/05/2014 08/19/2013  WEIGHT 183 lb 179 lb 12.8 oz 175 lb 0.6 oz  HEIGHT 5\' 7"  5\' 7"  5\' 7"   BMI 28.66 kg/m2 28.15 kg/m2 27.41 kg/m2    Current exercise per week 60 minutes.

## 2014-08-13 NOTE — Assessment & Plan Note (Signed)
Followed by endo, nedes to schedule and keep follow up, will refer

## 2014-08-13 NOTE — Assessment & Plan Note (Signed)
Deteriorated Will attend nutrtion counseling for help with healthy food choice  Updated lab needed at/ before next visit.

## 2014-08-14 ENCOUNTER — Other Ambulatory Visit: Payer: Self-pay | Admitting: Family Medicine

## 2014-08-14 NOTE — Addendum Note (Signed)
Addended by: Denman George B on: 08/14/2014 02:03 PM   Modules accepted: Orders

## 2014-08-14 NOTE — Addendum Note (Signed)
Addended by: Eual Fines on: 08/14/2014 08:35 AM   Modules accepted: Orders, Medications

## 2014-08-17 NOTE — Addendum Note (Signed)
Addended by: Denman George B on: 08/17/2014 01:54 PM   Modules accepted: Orders

## 2014-12-17 ENCOUNTER — Ambulatory Visit: Payer: Self-pay | Admitting: Family Medicine

## 2015-02-08 ENCOUNTER — Other Ambulatory Visit: Payer: Self-pay

## 2015-02-08 MED ORDER — LEVOTHYROXINE SODIUM 125 MCG PO TABS: 125.0000 ug | ORAL_TABLET | Freq: Every day | ORAL | Status: DC

## 2015-02-23 ENCOUNTER — Encounter: Payer: Self-pay | Admitting: Family Medicine

## 2015-02-23 ENCOUNTER — Ambulatory Visit (INDEPENDENT_AMBULATORY_CARE_PROVIDER_SITE_OTHER): Payer: PRIVATE HEALTH INSURANCE | Admitting: Family Medicine

## 2015-02-23 VITALS — BP 102/70 | HR 78 | Resp 18 | Wt 171.1 lb

## 2015-02-23 DIAGNOSIS — R7303 Prediabetes: Secondary | ICD-10-CM | POA: Diagnosis not present

## 2015-02-23 DIAGNOSIS — E785 Hyperlipidemia, unspecified: Secondary | ICD-10-CM

## 2015-02-23 DIAGNOSIS — Z1231 Encounter for screening mammogram for malignant neoplasm of breast: Secondary | ICD-10-CM

## 2015-02-23 DIAGNOSIS — R0789 Other chest pain: Secondary | ICD-10-CM

## 2015-02-23 DIAGNOSIS — E038 Other specified hypothyroidism: Secondary | ICD-10-CM

## 2015-02-23 DIAGNOSIS — K219 Gastro-esophageal reflux disease without esophagitis: Secondary | ICD-10-CM

## 2015-02-23 DIAGNOSIS — F4329 Adjustment disorder with other symptoms: Secondary | ICD-10-CM

## 2015-02-23 DIAGNOSIS — M5090 Cervical disc disorder, unspecified, unspecified cervical region: Secondary | ICD-10-CM

## 2015-02-23 MED ORDER — GABAPENTIN 300 MG PO CAPS: 300.0000 mg | ORAL_CAPSULE | Freq: Every day | ORAL | Status: DC

## 2015-02-23 MED ORDER — PREDNISONE 5 MG PO TABS: 5.0000 mg | ORAL_TABLET | Freq: Two times a day (BID) | ORAL | Status: DC

## 2015-02-23 MED ORDER — IBUPROFEN 800 MG PO TABS: 800.0000 mg | ORAL_TABLET | Freq: Two times a day (BID) | ORAL | Status: DC

## 2015-02-23 NOTE — Patient Instructions (Addendum)
F/u in 4.5 month, call if you need me sooner  You have reproducible chest wall pain are a main part of your chest pain symptom 1 week course of anti inflammatories, ibuprofen and prednisone are prescribed  Start gabapentin 300 mg one at night for chronic neck pain , and I will provide contact info for massages through "My Chart"  Fasting labs needed when you get labs for endocrinologist  Good that you use your sleep machine , aim for 6 or more hrs of sleep and exercise for 30 mins every day  Congrats on weight loss  Mammogram due this month let us know your day off to schedule  EKG in office today  Use reflux med regularly, and PLEASE try to work on stress relief, and more REST time  Thanks for choosing [facility], we consider it a privelige to serve you.

## 2015-02-23 NOTE — Progress Notes (Signed)
Subjective:    Patient ID: Stacy [name], female    DOB: 07-06-1960, 54 y.o.   MRN: [id number]  HPI   Stacy [name]     MRN: [id number]      DOB: 02-16-1961   HPI Stacy [name] is here for follow up and re-evaluation of chronic medical conditions, medication management and review of any available recent lab and radiology data.  Preventive health is updated, specifically  Cancer screening and Immunization.   Questions or concerns regarding consultations or procedures which the PT has had in the interim are  Addressed.Still need to see endo re thyroid , this is past due and she has lost weight , seemingly unintentionally The PT denies any adverse reactions to current medications since the last visit. Not taking either metformin or gabapentin, both would be beneficial  C/o left anterior chest pain for past 3  Nights, at rest, lasts about 30 mins until she rests. Poor sleep , increased new stress at home with senior family member moving in, chronic stress on the job Neck pain persists , feels that epidural injections not beneficial, encouraged use ogf gabapentin regularly  ROS Denies recent fever or chills. Denies sinus pressure, nasal congestion, ear pain or sore throat. Denies chest congestion, productive cough or wheezing. Denies  palpitations and leg swelling Denies  nausea, vomiting,diarrhea or constipation.   Denies dysuria, frequency, hesitancy or incontinence. Denies headaches, seizures,   Denies depression, co/ increased stress , and insufficient sleep an ongoing problem Denies skin break down or rash.   PE  BP 102/70 mmHg  Pulse 78  Resp 18  Wt 171 lb 1.3 oz (77.601 kg)  SpO2 99%  Patient alert and oriented and in no cardiopulmonary distress.  HEENT: No facial asymmetry, EOMI,   oropharynx pink and moist.  Neck decreased though adequate ROM, mild left trapezius spasm, no JVD, no mass.  Chest: Clear to auscultation bilaterally.Reproducible chest wall pain at CC  junctions 3 and 4 on left  CVS: S1, S2 no murmurs, no S3.Regular rate. EKG: NSR, no ischemia, no LVH , unchanged when compared to prior EKG  ABD: Soft non tender   Ext: No edema  MS: Adequate ROM spine, shoulders, hips and knees.  Skin: Intact, no ulcerations or rash noted.  Psych: Good eye contact, normal affect. Memory intact mildly  anxious not  depressed appearing.  CNS: CN 2-12 intact, power,  normal throughout.no focal deficits noted.   Assessment & Plan   Chest pain 3 day h/o recurrent ant left chest pain Office EKG: NSR, no ischemia, no LVH, no change from previous EKG when conmpared Reproducible ant chest pain on exam at CC 3 and 4 on left, anti inflammatory mediations to be taken short term. Stress also a contributing factor to her chest pain, discussed management   Cervical neck pain with evidence of disc disease Encouraged to commit to regular gabapentin use at bedtime for pain management, will also look at massage therapy  Hypothyroidism managed by endo, has upcoming appt when control will be assessed , she is past due   Prediabetes Patient educated about the importance of limiting  Carbohydrate intake , the need to commit to daily physical activity for a minimum of 30 minutes , and to commit weight loss. The fact that changes in all these areas will reduce or eliminate all together the development of diabetes is stressed.  Not taking the metformin suggested, significant weight loss noted, needs  Both thyroid and diabetic status updated  Diabetic Labs Latest Ref Rng 08/11/2014 01/22/2014 08/01/2013 01/21/2013 05/15/2012  HbA1c <5.7 % 6.0(H) 6.1(H) 6.0(H) 6.3(H) 5.9(H)  Chol 0 - 200 mg/dL 205(H) - 174 - 166  HDL >=46 mg/dL 50 - 43 - 41  Calc LDL 0 - 99 mg/dL 134(H) - 108(H) - 102(H)  Triglycerides <150 mg/dL 107 - 116 - 116  Creatinine 0.50 - 1.10 mg/dL 0.97 0.96 0.95 0.98 0.94   BP/Weight 02/23/2015 08/11/2014 03/05/2014 08/19/2013 05/01/2013 03/05/2013 00/17/4944    Systolic BP 967 591 638 466 98 - 599  Diastolic BP 70 80 80 78 64 - 65  Wt. (Lbs) 171.08 183 179.8 175.04 188 186 184.2  BMI 26.79 28.66 28.15 27.41 29.44 29.12 28.84   Foot/eye exam completion dates Latest Ref Rng 12/09/2013  Eye Exam No Retinopathy No Retinopathy  Foot Form Completion - -     Updated lab neededt.   FATIGUE Improved with use of device for disordered sleep breathing. Encouraged to use device more consistently  Stress and adjustment reaction Chronic stress from work, now with new stress at home as her mother in ;law has moved in in the past week, definitely  Contributing inj great part to chest pain  She presents with. Encouraged her to discuss new home routine with her spouse to lessen stress, she agrees  GERD Uncontrolled due to non compliance with treatment Re educated re need to use meds daily and reduce caffeine intake       Review of Systems     Objective:   Physical Exam        Assessment & Plan:

## 2015-03-07 DIAGNOSIS — R079 Chest pain, unspecified: Secondary | ICD-10-CM | POA: Insufficient documentation

## 2015-03-07 DIAGNOSIS — F4329 Adjustment disorder with other symptoms: Secondary | ICD-10-CM | POA: Insufficient documentation

## 2015-03-07 NOTE — Assessment & Plan Note (Signed)
Improved with use of device for disordered sleep breathing. Encouraged to use device more consistently

## 2015-03-07 NOTE — Assessment & Plan Note (Signed)
Patient educated about the importance of limiting  Carbohydrate intake , the need to commit to daily physical activity for a minimum of 30 minutes , and to commit weight loss. The fact that changes in all these areas will reduce or eliminate all together the development of diabetes is stressed.  Not taking the metformin suggested, significant weight loss noted, needs  Both thyroid and diabetic status updated  Diabetic Labs Latest Ref Rng 08/11/2014 01/22/2014 08/01/2013 01/21/2013 05/15/2012  HbA1c <5.7 % 6.0(H) 6.1(H) 6.0(H) 6.3(H) 5.9(H)  Chol 0 - 200 mg/dL 205(H) - 174 - 166  HDL >=46 mg/dL 50 - 43 - 41  Calc LDL 0 - 99 mg/dL 134(H) - 108(H) - 102(H)  Triglycerides <150 mg/dL 107 - 116 - 116  Creatinine 0.50 - 1.10 mg/dL 0.97 0.96 0.95 0.98 0.94   BP/Weight 02/23/2015 08/11/2014 03/05/2014 08/19/2013 05/01/2013 03/05/2013 [id number]  Systolic BP [id number] [id number] [id number] [id number] 98 - [id number]  Diastolic BP 70 80 80 78 64 - 65  Wt. (Lbs) 171.08 183 179.8 175.04 188 186 184.2  BMI 26.79 28.66 28.15 27.41 29.44 29.12 28.84   Foot/eye exam completion dates Latest Ref Rng 12/09/2013  Eye Exam No Retinopathy No Retinopathy  Foot Form Completion - -     Updated lab neededt.

## 2015-03-07 NOTE — Assessment & Plan Note (Signed)
managed by endo, has upcoming appt when control will be assessed , she is past due

## 2015-03-07 NOTE — Assessment & Plan Note (Addendum)
3 day h/o recurrent ant left chest pain Office EKG: NSR, no ischemia, no LVH, no change from previous EKG when conmpared Reproducible ant chest pain on exam at CC 3 and 4 on left, anti inflammatory mediations to be taken short term. Stress also a contributing factor to her chest pain, discussed management

## 2015-03-07 NOTE — Assessment & Plan Note (Signed)
Encouraged to commit to regular gabapentin use at bedtime for pain management, will also look at massage therapy

## 2015-03-07 NOTE — Assessment & Plan Note (Signed)
Uncontrolled due to non compliance with treatment Re educated re need to use meds daily and reduce caffeine intake

## 2015-03-07 NOTE — Assessment & Plan Note (Signed)
Chronic stress from work, now with new stress at home as her mother in ;law has moved in in the past week, definitely  Contributing inj great part to chest pain  She presents with. Encouraged her to discuss new home routine with her spouse to lessen stress, she agrees

## 2015-03-08 ENCOUNTER — Encounter: Payer: Self-pay | Admitting: Family Medicine

## 2015-03-09 ENCOUNTER — Telehealth: Payer: Self-pay

## 2015-03-09 DIAGNOSIS — E038 Other specified hypothyroidism: Secondary | ICD-10-CM

## 2015-03-09 DIAGNOSIS — Z114 Encounter for screening for human immunodeficiency virus [HIV]: Secondary | ICD-10-CM

## 2015-03-09 DIAGNOSIS — Z1159 Encounter for screening for other viral diseases: Secondary | ICD-10-CM

## 2015-03-09 NOTE — Addendum Note (Signed)
Addended by: Eual Fines on: 03/09/2015 02:47 PM   Modules accepted: Orders

## 2015-03-09 NOTE — Telephone Encounter (Signed)
Order faxed to [facility]

## 2015-03-10 LAB — LIPID PANEL
Cholesterol: 134 mg/dL (ref 125–200)
HDL: 46 mg/dL (ref >=46)
LDL Cholesterol: 69 mg/dL (ref <130)
Total CHOL/HDL Ratio: 2.9 Ratio (ref <=5.0)
Triglycerides: 93 mg/dL (ref <150)
VLDL: 19 mg/dL (ref <30)

## 2015-03-10 LAB — CBC
HCT: 35.4 % — ABNORMAL LOW (ref 36.0–46.0)
Hemoglobin: 12 g/dL (ref 12.0–15.0)
MCH: 29.3 pg (ref 26.0–34.0)
MCHC: 33.9 g/dL (ref 30.0–36.0)
MCV: 86.6 fL (ref 78.0–100.0)
MPV: 12.2 fL (ref 8.6–12.4)
PLATELETS: 160 10*3/uL (ref 150–400)
RBC: 4.09 MIL/uL (ref 3.87–5.11)
RDW: 13.5 % (ref 11.5–15.5)
WBC: 3.9 10*3/uL — AB (ref 4.0–10.5)

## 2015-03-10 LAB — COMPLETE METABOLIC PANEL WITH GFR
ALT: 16 U/L (ref 6–29)
AST: 19 U/L (ref 10–35)
Albumin: 3.9 g/dL (ref 3.6–5.1)
Alkaline Phosphatase: 70 U/L (ref 33–130)
BILIRUBIN TOTAL: 0.7 mg/dL (ref 0.2–1.2)
BUN: 11 mg/dL (ref 7–25)
CO2: 29 mmol/L (ref 20–31)
Calcium: 9.1 mg/dL (ref 8.6–10.4)
Chloride: 104 mmol/L (ref 98–110)
Creat: 0.87 mg/dL (ref 0.50–1.05)
GFR, EST NON AFRICAN AMERICAN: 76 mL/min (ref >=60)
GFR, Est African American: 87 mL/min (ref >=60)
GLUCOSE: 74 mg/dL (ref 65–99)
Potassium: 3.8 mmol/L (ref 3.5–5.3)
SODIUM: 139 mmol/L (ref 135–146)
TOTAL PROTEIN: 7.2 g/dL (ref 6.1–8.1)

## 2015-03-10 LAB — HEPATITIS C ANTIBODY: HCV Ab: NEGATIVE

## 2015-03-10 LAB — TSH: TSH: 0.226 u[IU]/mL — AB (ref 0.350–4.500)

## 2015-03-10 LAB — T4, FREE: FREE T4: 1.25 ng/dL (ref 0.80–1.80)

## 2015-03-10 LAB — HIV ANTIBODY (ROUTINE TESTING W REFLEX): HIV: NONREACTIVE

## 2015-03-10 LAB — HEMOGLOBIN A1C
HEMOGLOBIN A1C: 5.7 % — AB (ref <5.7)
MEAN PLASMA GLUCOSE: 117 mg/dL — AB (ref <117)

## 2015-03-11 ENCOUNTER — Ambulatory Visit (INDEPENDENT_AMBULATORY_CARE_PROVIDER_SITE_OTHER): Payer: PRIVATE HEALTH INSURANCE | Admitting: "Endocrinology

## 2015-03-11 ENCOUNTER — Encounter: Payer: Self-pay | Admitting: "Endocrinology

## 2015-03-11 VITALS — BP 115/71 | HR 60 | Ht 67.0 in | Wt 167.0 lb

## 2015-03-11 DIAGNOSIS — E039 Hypothyroidism, unspecified: Secondary | ICD-10-CM | POA: Diagnosis not present

## 2015-03-11 DIAGNOSIS — R7309 Other abnormal glucose: Secondary | ICD-10-CM

## 2015-03-11 DIAGNOSIS — E042 Nontoxic multinodular goiter: Secondary | ICD-10-CM | POA: Diagnosis not present

## 2015-03-11 MED ORDER — LEVOTHYROXINE SODIUM 125 MCG PO TABS: 125.0000 ug | ORAL_TABLET | Freq: Every day | ORAL | Status: DC

## 2015-03-11 NOTE — Progress Notes (Signed)
Subjective:    Patient ID: Stacy [name], female    DOB: 1960-08-20,  PCP [name], MD   Past Medical History  Diagnosis Date  . GERD (gastroesophageal reflux disease)   . Onychomycosis     Left great toe  . Lactose intolerance   . Hashimoto thyroiditis   . Hyperlipidemia    Past Surgical History  Procedure Laterality Date  . Dilation and curettage of uterus      Seconday to miscarriage  . Knee arthroscopy  2003    right knee  . Colonoscopy  04/04/2011    Internal hemorrhoids/Nl colonoscopy-SLIGHTLY TORTUOUS COLON  . Esophagogastroduodenoscopy  09/22/2011    Mild gastritis/Hiatal hernia/DYSPHAGIA DUE TO PEPTIC STRICTURE/UNCONTROLLED GERD  . Savory dilation  09/22/2011    Procedure: SAVORY DILATION;  Surgeon: Danie Binder, MD;  [facility];  Service: Endoscopy;  Laterality: N/A;  Venia Minks dilation  09/22/2011    Procedure: MALONEY DILATION;  Surgeon: Danie Binder, MD;  [facility];  Service: Endoscopy;  Laterality: N/A;   Social History   Social History  . Marital Status: Married    Spouse Name: N/A  . Number of Children: N/A  . Years of Education: N/A   Occupational History  . Full time: [employer]    Social History Main Topics  . Smoking status: Never Smoker   . Smokeless tobacco: None  . Alcohol Use: No  . Drug Use: No  . Sexual Activity: Not Asked   Other Topics Concern  . None   Social History Narrative   Married to [name], neurology   Regular exercise: No   Outpatient Encounter Prescriptions as of 03/11/2015  Medication Sig  . levothyroxine (SYNTHROID, LEVOTHROID) 125 MCG tablet Take 1 tablet (125 mcg total) by mouth daily.  . [DISCONTINUED] levothyroxine (SYNTHROID, LEVOTHROID) 125 MCG tablet Take 1 tablet (125 mcg total) by mouth daily.  Marland Kitchen dexlansoprazole (DEXILANT) 60 MG capsule Take 1 capsule (60 mg total) by mouth daily. (Patient not taking: Reported on 03/11/2015)  . gabapentin (NEURONTIN) 300 MG  capsule Take 1 capsule (300 mg total) by mouth at bedtime. (Patient not taking: Reported on 03/11/2015)  . glucosamine-chondroitin 500-400 MG tablet Take 1 tablet by mouth as directed.  Marland Kitchen ibuprofen (ADVIL,MOTRIN) 800 MG tablet Take 1 tablet (800 mg total) by mouth 2 (two) times daily. (Patient not taking: Reported on 03/11/2015)  . Multiple Vitamin (MULTIVITAMIN) tablet Take 1 tablet by mouth daily.    . predniSONE (DELTASONE) 5 MG tablet Take 1 tablet (5 mg total) by mouth 2 (two) times daily with a meal. (Patient not taking: Reported on 03/11/2015)   No facility-administered encounter medications on file as of 03/11/2015.   ALLERGIES: No Known Allergies VACCINATION STATUS: Immunization History  Administered Date(s) Administered  . Influenza Split 02/10/2014  . Influenza,inj,Quad PF,36+ Mos 01/30/2013, 01/12/2015  . Tdap 05/09/2012    HPI  54 yr old female with hypothyroidism from Hashimoto's thyroiditis. She has been consistent with her levothyroxine currently at 125 mcg by mouth every morning. She has lost almost 20 pounds since last visit. He has made some dietary changes including gluten-free diet.  she denies any family hx of thyroid dysfunction. She has a stable 9 mm right lobe nodule, did not repeat her thyroid ultrasound before this visit. she denies heat/cold intolerance. she has 4 children, with hx of HEELP syndrome during the first pregnancy. -She has stopped metformin due to intolerance. Her A1c has improved 5.7% from  6.1%.  Review of Systems  Constitutional:  + weight loss, no fatigue, no subjective hyperthermia/hypothermia Eyes: no blurry vision, no xerophthalmia ENT: no sore throat, no nodules palpated in throat, no dysphagia/odynophagia, no hoarseness Cardiovascular: no CP/SOB/palpitations/leg swelling Respiratory: no cough/SOB Gastrointestinal: no N/V/D/C Musculoskeletal: no muscle/joint aches Skin: no rashes Neurological: no  tremors/numbness/tingling/dizziness Psychiatric: no depression/anxiety  Objective:    BP 115/71 mmHg  Pulse 60  Ht 5\' 7"  (1.702 m)  Wt 167 lb (75.751 kg)  BMI 26.15 kg/m2  SpO2 99%  Wt Readings from Last 3 Encounters:  03/11/15 167 lb (75.751 kg)  02/23/15 171 lb 1.3 oz (77.601 kg)  08/11/14 183 lb (83.008 kg)    Physical Exam  Constitutional: Steady state of mind,  in NAD Eyes: PERRLA, EOMI, no exophthalmos ENT: moist mucous membranes, no thyromegaly, no cervical lymphadenopathy Cardiovascular: RRR, No MRG Musculoskeletal: no deformities, strength intact in all 4 Skin: moist, warm, no rashes Neurological: no tremor with outstretched hands, DTR normal in all 4  Results for orders placed or performed in visit on 03/09/15  Hepatitis C antibody  Result Value Ref Range   HCV Ab NEGATIVE NEGATIVE  HIV antibody  Result Value Ref Range   HIV 1&2 Ab, 4th Generation NONREACTIVE NONREACTIVE  TSH  Result Value Ref Range   TSH 0.226 (L) 0.350 - 4.500 uIU/mL  T4, free  Result Value Ref Range   Free T4 1.25 0.80 - 1.80 ng/dL   Complete Blood Count (Most recent): Lab Results  Component Value Date   WBC 3.9* 03/09/2015   HGB 12.0 03/09/2015   HCT 35.4* 03/09/2015   MCV 86.6 03/09/2015   PLT 160 03/09/2015   Chemistry (most recent): Lab Results  Component Value Date   NA 139 03/09/2015   K 3.8 03/09/2015   CL 104 03/09/2015   CO2 29 03/09/2015   BUN 11 03/09/2015   CREATININE 0.87 03/09/2015   Diabetic Labs (most recent): Lab Results  Component Value Date   HGBA1C 5.7* 03/09/2015   HGBA1C 6.0* 08/11/2014   HGBA1C 6.1* 01/22/2014   Lipid profile (most recent): Lab Results  Component Value Date   TRIG 93 03/09/2015   CHOL 134 03/09/2015       Assessment & Plan:   1) hypothyroidism from Hashimoto's thyroiditis:   -She is currently euthyroid. I advised her to continue levothyroxine 125 g by mouth every morning.  - We discussed about correct intake of  levothyroxine, at fasting, with water, separated by at least 30 minutes from breakfast, and separated by more than 4 hours from calcium, iron, multivitamins, acid reflux medications (PPIs). -Patient is made aware of the fact that thyroid hormone replacement is needed for life, dose to be adjusted by periodic monitoring of thyroid function tests.  2) multinodular goiter -Her thyroid ultrasound was not repeated before this visit. She has 9 mm nodule on the right lobe of the thyroid. Her last thyroid ultrasound was 2 years ago. I have requested repeat thyroid ultrasound. If nodules go to greater than 1 cm or above she will be considered for fine needle aspiration.  3) prediabetes: -Her A1c has improved 5.7%, she has lost nearly 20 pounds since last visit. She has stopped metformin due to some intolerance. I advised her to stay off of metformin for now.  I advised patient to maintain close follow up with their PCP for primary care needs. Follow up plan: Return in about 6 months (around 09/08/2015) for underactive thyroid, prediabetes, MNG.  Glade Lloyd, MD  Phone: [phone]  Fax: [phone]   03/11/2015, 10:54 PM

## 2015-03-22 ENCOUNTER — Ambulatory Visit (HOSPITAL_COMMUNITY): Payer: PRIVATE HEALTH INSURANCE

## 2015-07-13 ENCOUNTER — Ambulatory Visit: Payer: PRIVATE HEALTH INSURANCE | Admitting: Family Medicine

## 2015-08-03 ENCOUNTER — Ambulatory Visit: Payer: Self-pay | Admitting: Family Medicine

## 2015-09-09 ENCOUNTER — Ambulatory Visit: Payer: PRIVATE HEALTH INSURANCE | Admitting: "Endocrinology

## 2015-09-30 ENCOUNTER — Telehealth: Payer: Self-pay

## 2015-09-30 ENCOUNTER — Ambulatory Visit (HOSPITAL_COMMUNITY)
Admission: RE | Admit: 2015-09-30 | Discharge: 2015-09-30 | Disposition: A | Discharge status: Home / Self Care | Payer: BLUE CROSS/BLUE SHIELD | Source: Ambulatory Visit | Attending: "Endocrinology | Admitting: "Endocrinology

## 2015-09-30 DIAGNOSIS — E042 Nontoxic multinodular goiter: Secondary | ICD-10-CM | POA: Diagnosis present

## 2015-09-30 DIAGNOSIS — E041 Nontoxic single thyroid nodule: Secondary | ICD-10-CM | POA: Diagnosis not present

## 2015-09-30 DIAGNOSIS — E038 Other specified hypothyroidism: Secondary | ICD-10-CM

## 2015-09-30 LAB — TSH: TSH: 0.32 m[IU]/L — AB

## 2015-09-30 LAB — T4, FREE: Free T4: 1.3 ng/dL (ref 0.8–1.8)

## 2015-09-30 NOTE — Telephone Encounter (Signed)
Lab orders for Dr. Dorris Fetch updated

## 2015-10-05 ENCOUNTER — Ambulatory Visit (INDEPENDENT_AMBULATORY_CARE_PROVIDER_SITE_OTHER): Payer: BLUE CROSS/BLUE SHIELD | Admitting: "Endocrinology

## 2015-10-05 ENCOUNTER — Encounter: Payer: Self-pay | Admitting: "Endocrinology

## 2015-10-05 VITALS — BP 135/82 | HR 78 | Ht 67.0 in | Wt 178.0 lb

## 2015-10-05 DIAGNOSIS — E042 Nontoxic multinodular goiter: Secondary | ICD-10-CM

## 2015-10-05 DIAGNOSIS — R7303 Prediabetes: Secondary | ICD-10-CM | POA: Diagnosis not present

## 2015-10-05 DIAGNOSIS — E039 Hypothyroidism, unspecified: Secondary | ICD-10-CM

## 2015-10-05 MED ORDER — LEVOTHYROXINE SODIUM 125 MCG PO TABS: 125.0000 ug | ORAL_TABLET | Freq: Every day | ORAL | Status: DC

## 2015-10-05 NOTE — Progress Notes (Signed)
Subjective:    Patient ID: Stacy [name], female    DOB: [birth year],  PCP [name], MD   Past Medical History  Diagnosis Date  . GERD (gastroesophageal reflux disease)   . Onychomycosis     Left great toe  . Lactose intolerance   . Hashimoto thyroiditis   . Hyperlipidemia    Past Surgical History  Procedure Laterality Date  . Dilation and curettage of uterus      Seconday to miscarriage  . Knee arthroscopy  2003    right knee  . Colonoscopy  04/04/2011    Internal hemorrhoids/Nl colonoscopy-SLIGHTLY TORTUOUS COLON  . Esophagogastroduodenoscopy  09/22/2011    Mild gastritis/Hiatal hernia/DYSPHAGIA DUE TO PEPTIC STRICTURE/UNCONTROLLED GERD  . Savory dilation  09/22/2011    Procedure: SAVORY DILATION;  Surgeon: Danie Binder, MD;  [facility];  Service: Endoscopy;  Laterality: N/A;  Venia Minks dilation  09/22/2011    Procedure: MALONEY DILATION;  Surgeon: Danie Binder, MD;  [facility];  Service: Endoscopy;  Laterality: N/A;   Social History   Social History  . Marital Status: Married    Spouse Name: N/A  . Number of Children: N/A  . Years of Education: N/A   Occupational History  . Full time: [employer]    Social History Main Topics  . Smoking status: Never Smoker   . Smokeless tobacco: None  . Alcohol Use: No  . Drug Use: No  . Sexual Activity: Not Asked   Other Topics Concern  . None   Social History Narrative   Married to [name], neurology   Regular exercise: No   Outpatient Encounter Prescriptions as of 10/05/2015  Medication Sig  . levothyroxine (SYNTHROID, LEVOTHROID) 125 MCG tablet Take 1 tablet (125 mcg total) by mouth daily.  . Multiple Vitamin (MULTIVITAMIN) tablet Take 1 tablet by mouth daily.    . [DISCONTINUED] levothyroxine (SYNTHROID, LEVOTHROID) 125 MCG tablet Take 1 tablet (125 mcg total) by mouth daily.  Marland Kitchen dexlansoprazole (DEXILANT) 60 MG capsule Take 1 capsule (60 mg total) by mouth daily. (Patient  not taking: Reported on 03/11/2015)  . gabapentin (NEURONTIN) 300 MG capsule Take 1 capsule (300 mg total) by mouth at bedtime. (Patient not taking: Reported on 03/11/2015)  . glucosamine-chondroitin 500-400 MG tablet Take 1 tablet by mouth as directed. Reported on 10/05/2015  . ibuprofen (ADVIL,MOTRIN) 800 MG tablet Take 1 tablet (800 mg total) by mouth 2 (two) times daily. (Patient not taking: Reported on 03/11/2015)  . predniSONE (DELTASONE) 5 MG tablet Take 1 tablet (5 mg total) by mouth 2 (two) times daily with a meal. (Patient not taking: Reported on 03/11/2015)   No facility-administered encounter medications on file as of 10/05/2015.   ALLERGIES: No Known Allergies VACCINATION STATUS: Immunization History  Administered Date(s) Administered  . Influenza Split 02/10/2014  . Influenza,inj,Quad PF,36+ Mos 01/30/2013, 01/12/2015  . Tdap 05/09/2012    HPI  55 yr old female with hypothyroidism from Hashimoto's thyroiditis. She has been consistent with her levothyroxine currently at 125 mcg by mouth every morning. She has Gained approximately 10 pounds since last visit .  she denies any family hx of thyroid dysfunction. She has a stable  1.2 cm right thyroid lobe nodule growing slightly from 9 mm  in 3 years . she denies heat/cold intolerance. she has 4 children, with hx of HEELP syndrome during the first pregnancy. -She has stopped metformin due to intolerance. Her A1c last  has improved 5.7% from  6.1%.  Review of Systems  Constitutional:  + weight lgain, no fatigue, no subjective hyperthermia/hypothermia Eyes: no blurry vision, no xerophthalmia ENT: no sore throat, no nodules palpated in throat, no dysphagia/odynophagia, no hoarseness Cardiovascular: no CP/SOB/palpitations/leg swelling Respiratory: no cough/SOB Gastrointestinal: no N/V/D/C Musculoskeletal: no muscle/joint aches Skin: no rashes Neurological: no tremors/numbness/tingling/dizziness Psychiatric: no  depression/anxiety  Objective:    BP 135/82 mmHg  Pulse 78  Ht 5\' 7"  (1.702 m)  Wt 178 lb (80.74 kg)  BMI 27.87 kg/m2  Wt Readings from Last 3 Encounters:  10/05/15 178 lb (80.74 kg)  03/11/15 167 lb (75.751 kg)  02/23/15 171 lb 1.3 oz (77.601 kg)    Physical Exam  Constitutional: Steady state of mind,  in NAD Eyes: PERRLA, EOMI, no exophthalmos ENT: moist mucous membranes, no thyromegaly, no cervical lymphadenopathy Cardiovascular: RRR, No MRG Musculoskeletal: no deformities, strength intact in all 4 Skin: moist, warm, no rashes Neurological: no tremor with outstretched hands, DTR normal in all 4  Results for orders placed or performed in visit on 09/30/15  TSH  Result Value Ref Range   TSH 0.32 (L) mIU/L  T4, free  Result Value Ref Range   Free T4 1.3 0.8 - 1.8 ng/dL   Chemistry (most recent): Lab Results  Component Value Date   NA 139 03/09/2015   K 3.8 03/09/2015   CL 104 03/09/2015   CO2 29 03/09/2015   BUN 11 03/09/2015   CREATININE 0.87 03/09/2015   Diabetic Labs (most recent): Lab Results  Component Value Date   HGBA1C 5.7* 03/09/2015   HGBA1C 6.0* 08/11/2014   HGBA1C 6.1* 01/22/2014   Lipid Panel     Component Value Date/Time   CHOL 134 03/09/2015 1509   TRIG 93 03/09/2015 1509   HDL 46 03/09/2015 1509   CHOLHDL 2.9 03/09/2015 1509   VLDL 19 03/09/2015 1509   LDLCALC 69 03/09/2015 1509       Assessment & Plan:   1) hypothyroidism from Hashimoto's thyroiditis:   -Her thyroid function tests are stable and consistent with appropriate replacement. I advised her to continue levothyroxine 125 g by mouth every morning.  - We discussed about correct intake of levothyroxine, at fasting, with water, separated by at least 30 minutes from breakfast, and separated by more than 4 hours from calcium, iron, multivitamins, acid reflux medications (PPIs). -Patient is made aware of the fact that thyroid hormone replacement is needed for life, dose to be  adjusted by periodic monitoring of thyroid function tests.  2) multinodular goiter -Her thyroid ultrasound shows a right sided nodule grew from 9 mm to 12 mm over 3 year period of time. This is consistent with features of benign thyroid nodules.  He would not need antithyroid intervention at this point. She will be examined physically in 1 year and repeat thyroid ultrasound as necessary.  3) prediabetes:  -Her last A1c has improved 5.7%, she has gained 11 pounds since last visit. She has stopped metformin due to some intolerance. She will have repeat A1c before her next visit. I advised patient to maintain close follow up with their PCP for primary care needs. Follow up plan: Return in about 1 year (around 10/04/2016) for underactive thyroid, prediabetes, follow up with pre-visit labs.  Glade Lloyd, MD Phone: [phone]  Fax: [phone]   10/05/2015, 11:21 AM

## 2015-10-19 ENCOUNTER — Ambulatory Visit: Payer: BLUE CROSS/BLUE SHIELD | Admitting: Family Medicine

## 2015-10-19 ENCOUNTER — Encounter: Payer: Self-pay | Admitting: Family Medicine

## 2016-05-16 ENCOUNTER — Other Ambulatory Visit: Payer: Self-pay | Admitting: Family Medicine

## 2016-05-16 DIAGNOSIS — Z1231 Encounter for screening mammogram for malignant neoplasm of breast: Secondary | ICD-10-CM

## 2016-05-18 ENCOUNTER — Ambulatory Visit (HOSPITAL_COMMUNITY)
Admission: RE | Admit: 2016-05-18 | Discharge: 2016-05-18 | Disposition: A | Discharge status: Home / Self Care | Payer: BLUE CROSS/BLUE SHIELD | Source: Ambulatory Visit | Attending: Family Medicine | Admitting: Family Medicine

## 2016-05-18 DIAGNOSIS — Z1231 Encounter for screening mammogram for malignant neoplasm of breast: Secondary | ICD-10-CM | POA: Diagnosis present

## 2016-07-13 ENCOUNTER — Other Ambulatory Visit: Payer: Self-pay | Admitting: Obstetrics and Gynecology

## 2016-07-14 ENCOUNTER — Ambulatory Visit (INDEPENDENT_AMBULATORY_CARE_PROVIDER_SITE_OTHER): Payer: Self-pay | Admitting: Obstetrics and Gynecology

## 2016-07-14 ENCOUNTER — Encounter: Payer: Self-pay | Admitting: Obstetrics and Gynecology

## 2016-07-14 VITALS — BP 102/68 | HR 66 | Resp 18 | Ht 66.0 in | Wt 183.0 lb

## 2016-07-14 DIAGNOSIS — N939 Abnormal uterine and vaginal bleeding, unspecified: Secondary | ICD-10-CM

## 2016-07-14 DIAGNOSIS — Z01419 Encounter for gynecological examination (general) (routine) without abnormal findings: Secondary | ICD-10-CM

## 2016-07-14 LAB — CBC
HCT: 37.8 % (ref 35.0–45.0)
Hemoglobin: 12.7 g/dL (ref 11.7–15.5)
MCH: 29.6 pg (ref 27.0–33.0)
MCHC: 33.6 g/dL (ref 32.0–36.0)
MCV: 88.1 fL (ref 80.0–100.0)
MPV: 12.3 fL (ref 7.5–12.5)
PLATELETS: 179 10*3/uL (ref 140–400)
RBC: 4.29 MIL/uL (ref 3.80–5.10)
RDW: 14 % (ref 11.0–15.0)
WBC: 3.8 10*3/uL (ref 3.8–10.8)

## 2016-07-14 LAB — POCT URINE PREGNANCY: PREG TEST UR: NEGATIVE

## 2016-07-14 NOTE — Patient Instructions (Signed)

## 2016-07-14 NOTE — Addendum Note (Signed)
Addended by: Lowella Fairy on: 07/14/2016 11:26 AM   Modules accepted: Orders

## 2016-07-14 NOTE — Progress Notes (Signed)
56 y.o. [id number]. Married [place] American female here for annual exam.    Spotting weekly for 2 -3 days for the last 6 months.  Some dizziness. Sometimes has pain but no medication use.   Menses were normal and regular one year ago.  They were monthly and bled for 5 - 7 days.  Moderate flow but increased with exercise.   History of small fibroids 2 -3 years ago.  Had an office ultrasound and EMB? which were negative at [place]. Told she was retroverted and that the evaluation was difficult. She was scheduled for a dilation and curettage for next month.  Last delivery was very rapid.  States she believes she had a cervical laceration.   Has urinary incontinence.  Wears a pad every day.  Leaks with cough, laugh, sneeze, and exercise. Kegel's no help.  No spontaneous leakage or urgency/frequency.   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   Labs with PCP.   PCP: Alvera Novel, MD    Patient's last menstrual period was 07/09/2016 (exact date).     Period Cycle (Days): 30 Period Pattern: Regular Menstrual Flow:  (spotting weekly x6 months) Menstrual Control: Maxi pad Dysmenorrhea: (!) Mild Dysmenorrhea Symptoms: Cramping     Sexually active: Yes.   female The current method of family planning is vasectomy.    Exercising: No.   Smoker:  no  Health Maintenance: Pap:  2 years ago normal per patient History of abnormal Pap:  no MMG:  05-18-16 Density C/Neg/BiRads1:[place] Colonoscopy:  04-04-11 normal with Dr. Oneida Alar in [place];next due 03/2021. BMD:   n/a  Result  n/a TDaP:  04/2012 Gardasil:   N/A HIV:  Neg 2016.  Hep C:   Neg 2016. Screening Labs:  Hb today: PCP.     reports that she has never smoked. She has never used smokeless tobacco. She reports that she does not drink alcohol or use drugs.  Past Medical History:  Diagnosis Date  . Abnormal uterine bleeding   . Anemia   . GERD (gastroesophageal reflux disease)   .  Hyperlipidemia   . Hypothyroidism    has right thyroid nodule  . Lactose intolerance   . Onychomycosis    Left great toe  . Urinary incontinence     Past Surgical History:  Procedure Laterality Date  . CESAREAN SECTION  1997   Twins-1 twin del.by c/s, 1 del.vaginally  . COLONOSCOPY  04/04/2011   Internal hemorrhoids/Nl colonoscopy-SLIGHTLY TORTUOUS COLON  . DILATION AND CURETTAGE OF UTERUS     Seconday to miscarriage  . ESOPHAGOGASTRODUODENOSCOPY  09/22/2011   Mild gastritis/Hiatal hernia/DYSPHAGIA DUE TO PEPTIC STRICTURE/UNCONTROLLED GERD  . KNEE ARTHROSCOPY  2003   right knee  . MALONEY DILATION  09/22/2011   Procedure: MALONEY DILATION;  Surgeon: Danie Binder, MD;  [facility];  Service: Endoscopy;  Laterality: N/A;  . SAVORY DILATION  09/22/2011   Procedure: SAVORY DILATION;  Surgeon: Danie Binder, MD;  [facility];  Service: Endoscopy;  Laterality: N/A;    Current Outpatient Prescriptions  Medication Sig Dispense Refill  . levothyroxine (SYNTHROID, LEVOTHROID) 125 MCG tablet Take 1 tablet (125 mcg total) by mouth daily. 30 tablet 12  . Multiple Vitamin (MULTIVITAMIN) tablet Take 1 tablet by mouth daily.       No current facility-administered medications for this visit.     Family History  Problem Relation Age of Onset  . Hypertension Mother   . Angina Mother  MVP. h/o intestinal polyps possibly  . Hyperlipidemia Mother   . Transient ischemic attack Mother   . Hypertension Father   . Hyperlipidemia Father     glucose intolerant  . Diabetes Father   . GER disease Brother   . Diabetes Brother   . Diabetes Maternal Grandfather   . Hyperlipidemia Paternal Grandmother   . Thyroid disease Paternal Grandmother   . Hyperlipidemia Paternal Grandfather   . Thyroid disease Brother     hyperthyroid  . Colon cancer Neg Hx   . Breast cancer Neg Hx   . Ovarian cancer Neg Hx   . Anesthesia problems Neg Hx     ROS:  Pertinent items are noted  in HPI.  Otherwise, a comprehensive ROS was negative.  Exam:   BP 102/68 (BP Location: Right Arm, Patient Position: Sitting, Cuff Size: Normal)   Pulse 66   Resp 18   Ht 5\' 6"  (1.676 m)   Wt 183 lb (83 kg)   LMP 07/09/2016 (Exact Date)   BMI 29.54 kg/m     General appearance: alert, cooperative and appears stated age Head: Normocephalic, without obvious abnormality, atraumatic Neck: no adenopathy, supple, symmetrical, trachea midline and thyroid normal to inspection and palpation Lungs: clear to auscultation bilaterally Breasts: normal appearance, no masses or tenderness, No nipple retraction or dimpling, No nipple discharge or bleeding, No axillary or supraclavicular adenopathy Heart: regular rate and rhythm Abdomen: soft, non-tender; no masses, no organomegaly Extremities: extremities normal, atraumatic, no cyanosis or edema Skin: Skin color, texture, turgor normal. No rashes or lesions Lymph nodes: Cervical, supraclavicular, and axillary nodes normal. No abnormal inguinal nodes palpated Neurologic: Grossly normal  Pelvic: External genitalia:  no lesions              Urethra:  normal appearing urethra with no masses, tenderness or lesions              Bartholins and Skenes: normal                 Vagina: normal appearing vagina with normal color and discharge, no lesions              Cervix:  Flush with vagina.  Difficult to see os.  Slight vaginal bleeding.               Pap taken: Yes.   Bimanual Exam:  Uterus:  normal size, contour, position, consistency, mobility, non-tender              Adnexa: no mass, fullness, tenderness              Rectal exam: Yes.  .  Confirms.              Anus:  normal sphincter tone, no lesions  Chaperone was present for exam.  Assessment:   Well woman visit with normal exam. Abnormal uterine bleeding.  Hx cervical laceration.  Hx Cesarean Section.  Hypothyroidism and thyroid nodule. Stress incontinence.   Plan: Mammogram screening  discussed. Recommended self breast awareness. Pap and HR HPV as above. Guidelines for Calcium, Vitamin D, regular exercise program including cardiovascular and weight bearing exercise. CBC. UPT. Will get records from [facility]. Discussed possible physical therapy and surgery for stress incontinence.  Follow up annually and prn.       After visit summary provided.

## 2016-07-19 LAB — IPS PAP TEST WITH HPV

## 2016-07-24 ENCOUNTER — Telehealth: Payer: Self-pay | Admitting: *Deleted

## 2016-07-24 NOTE — Telephone Encounter (Signed)
Call to patient. Reviewed results from Dr Quincy Simmonds and recommendation for further evaluation either here or with Dr Garwin Brothers if prefers. Patient states she thought the surgery was canceled but she was told by hospital it is still scheduled. Advised it is still posted at present time. Patient states she will discuss options with husband and call back tomorrow with decision.

## 2016-07-24 NOTE — Telephone Encounter (Signed)
-----   Message from Nunzio Cobbs, MD sent at 07/21/2016  6:39 PM EDT ----- Please report results of pap to patient.  HR HPV was negative.  Endometrial cells were seen on the pap. As she is having abnormal uterine bleeding, this requires further evaluation with endometrial sampling.   I have not received her records from [facility] yet for me to understand her evaluation to date.  Please check with her to see if she will be following through with our office or returning to [facility]. She was already scheduled for a dilation and curettage with Dr. Garwin Brothers on 07/28/16.

## 2016-07-26 NOTE — Telephone Encounter (Signed)
Records from [facility] ob[facility] in Dr Elza Rafter folder on chart.

## 2016-07-28 ENCOUNTER — Encounter (HOSPITAL_COMMUNITY): Payer: Self-pay

## 2016-07-28 ENCOUNTER — Ambulatory Visit (HOSPITAL_COMMUNITY): Admit: 2016-07-28 | Payer: BLUE CROSS/BLUE SHIELD | Admitting: Obstetrics and Gynecology

## 2016-07-28 SURGERY — DILATATION & CURETTAGE/HYSTEROSCOPY WITH MYOSURE
Anesthesia: Choice

## 2016-07-30 ENCOUNTER — Encounter: Payer: Self-pay | Admitting: Obstetrics and Gynecology

## 2016-07-30 NOTE — Telephone Encounter (Signed)
I have reviewed the patient's records from her prior office.  Please schedule a follow up appointment with me.  Her pap with me showed endometrial cells and she is having abnormal uterine bleeding. We needs endometrial sampling which can be done with office endometrial biopsy or hysteroscopy with dilation and curettage.

## 2016-07-31 NOTE — Telephone Encounter (Signed)
Return cal from patient. States she did not proceed with care and procedure at [facility] and would like to discuss further with Dr Quincy Simmonds. Appointment scheduled for Thursday 08-03-16 at 1130.   Routing to provider for final review. Patient agreeable to disposition. Will close encounter.

## 2016-07-31 NOTE — Telephone Encounter (Signed)
Call to patient. Unable to leave message. Voice mail is not set up.  

## 2016-08-02 NOTE — Progress Notes (Signed)
GYNECOLOGY  VISIT   HPI: 56 y.o.   Married  [place] American  female   [phone] with Patient's last menstrual period was 07/09/2016 (exact date).   here for consult for abnormal uterine bleeding and recent pap from this office on 07/14/16 showing endometrial cell.  Mother present for the discussion today.  Mother is a retired Haematologist.   Patient has had weekly spotting for 2 - 3 days for the last month.  Received prior care at [place], and records have since been received.   Sonohysterogram performed on 07/16/16 showing thickened endometrium and no specific mass.  No EMB performed.  Prior pelvic ultrasound on 06/15/16 showed EMS 7.7 mm.  Left ovary with 1 cm adnexal cyst with no internal blood flow. EMBs on 11/09/15 and 09/15/14 showing benign atrophic endometrium. Pelvic ultrasound on 09/09/14 showed 9.5 mm endometrium, 2.9 cm fibroid and a simple 2.3 cm left paratubal versus left ovarian cyst.  FSH 32.1 on 09/03/14.  Patient had hysteroscopy with dilation and curettage planned on 07/28/16 with her prior provider, but chose to cancel.   She was seen here for a routine annual exam on 07/14/16 and had a pap showing endometrial cells.   GYNECOLOGIC HISTORY: Patient's last menstrual period was 07/09/2016 (exact date). Contraception:  Vasectomy Menopausal hormone therapy:  none Last mammogram:  05-18-16 Density C/Neg/BiRads1:[place] Last pap smear: 07-14-16 Neg:Neg HR HPV--endometrial cells seen;2016 neg per patient        OB History    Gravida Para Term Preterm AB Living   4 3 3   1 4    SAB TAB Ectopic Multiple Live Births   1     1 4          Patient Active Problem List   Diagnosis Date Noted  . Primary hypothyroidism 03/11/2015  . Other abnormal glucose 03/11/2015  . 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 Chest pain 03/07/2015  . Stress and adjustment reaction 03/07/2015  . Polymenorrhea 08/13/2014  . Tonsillar exudate 08/13/2014  . Pain in joint, ankle and foot  08/13/2014  . Metabolic syndrome X 09/32/6712  . Sleep disorder 02/01/2013  . Prediabetes 01/30/2013  . OA (osteoarthritis) of knee 10/03/2012  . Cervical neck pain with evidence of disc disease 09/24/2012  . Hypothyroidism 09/21/2011  . ABNORMAL ELECTROCARDIOGRAM 03/04/2010  . Anemia 09/15/2009  . LEUKOPENIA, CHRONIC 09/15/2009  . FATIGUE 09/15/2009  . Overweight (BMI 25.0-29.9) 04/24/2009  . Dyslipidemia 05/23/2007  . GERD 05/23/2007    Past Medical History:  Diagnosis Date  . Abnormal uterine bleeding   . Anemia   . GERD (gastroesophageal reflux disease)   . Hyperlipidemia   . Hypothyroidism    has right thyroid nodule  . Lactose intolerance   . Onychomycosis    Left great toe  . Urinary incontinence   . Uterine fibroid     Past Surgical History:  Procedure Laterality Date  . CESAREAN SECTION  1997   Twins-1 twin del.by c/s, 1 del.vaginally  . COLONOSCOPY  04/04/2011   Internal hemorrhoids/Nl colonoscopy-SLIGHTLY TORTUOUS COLON  . DILATION AND CURETTAGE OF UTERUS     Seconday to miscarriage  . ESOPHAGOGASTRODUODENOSCOPY  09/22/2011   Mild gastritis/Hiatal hernia/DYSPHAGIA DUE TO PEPTIC STRICTURE/UNCONTROLLED GERD  . KNEE ARTHROSCOPY  2003   right knee  . MALONEY DILATION  09/22/2011   Procedure: MALONEY DILATION;  Surgeon: Danie Binder, MD;  [facility];  Service: Endoscopy;  Laterality: N/A;  . SAVORY DILATION  09/22/2011   Procedure: SAVORY  DILATION;  Surgeon: Danie Binder, MD;  [facility];  Service: Endoscopy;  Laterality: N/A;    Current Outpatient Prescriptions  Medication Sig Dispense Refill  . levothyroxine (SYNTHROID, LEVOTHROID) 125 MCG tablet Take 1 tablet (125 mcg total) by mouth daily. 30 tablet 12  . Multiple Vitamin (MULTIVITAMIN) tablet Take 1 tablet by mouth daily.       No current facility-administered medications for this visit.      ALLERGIES: Patient has no known allergies.  Family History  Problem Relation  Age of Onset  . Hypertension Mother   . Angina Mother     MVP. h/o intestinal polyps possibly  . Hyperlipidemia Mother   . Transient ischemic attack Mother   . Hypertension Father   . Hyperlipidemia Father     glucose intolerant  . Diabetes Father   . GER disease Brother   . Diabetes Brother   . Diabetes Maternal Grandfather   . Hyperlipidemia Paternal Grandmother   . Thyroid disease Paternal Grandmother   . Hyperlipidemia Paternal Grandfather   . Thyroid disease Brother     hyperthyroid  . Colon cancer Neg Hx   . Breast cancer Neg Hx   . Ovarian cancer Neg Hx   . Anesthesia problems Neg Hx     Social History   Social History  . Marital status: Married    Spouse name: N/A  . Number of children: N/A  . Years of education: N/A   Occupational History  . Full time: [employer]    Social History Main Topics  . Smoking status: Never Smoker  . Smokeless tobacco: Never Used  . Alcohol use No  . Drug use: No  . Sexual activity: Yes    Partners: Male    Birth control/ protection: Other-see comments     Comment: husband with vasectomy   Other Topics Concern  . Not on file   Social History Narrative   Married to [name], neurology   Regular exercise: No    ROS:  Pertinent items are noted in HPI.  PHYSICAL EXAMINATION:    BP 116/70 (BP Location: Right Arm, Patient Position: Sitting, Cuff Size: Large)   Pulse 60   Ht 5\' 6"  (1.676 m)   Wt 184 lb (83.5 kg)   LMP 07/09/2016 (Exact Date) Comment: spotting off & on  BMI 29.70 kg/m     General appearance: alert, cooperative and appears stated age   ASSESSMENT  Abnormal uterine bleeding.  Endometrial cells on pap.  PLAN  Discussion of abnormal uterine bleeding.  Discussion of hysteroscopy with possible Myosure, dilation and curettage.  Risks, benefits, and alternatives reviewed. Risks include but are not limited to bleeding, infection, damage to surrounding organs including uterine perforation requiring  hospitalization and laparoscopy, pulmonary edema, reaction to anesthesia, DVT, PE, death, need for further treatment and surgery including repeat hysteroscopy, hysterectomy or medical therapy.   Surgical expectations and recovery discussed.  Patient wishes to proceed.  An After Visit Summary was printed and given to the patient.  _25____ minutes face to face time of which over 50% was spent in counseling.

## 2016-08-03 ENCOUNTER — Ambulatory Visit (INDEPENDENT_AMBULATORY_CARE_PROVIDER_SITE_OTHER): Payer: No Typology Code available for payment source | Admitting: Obstetrics and Gynecology

## 2016-08-03 ENCOUNTER — Encounter: Payer: Self-pay | Admitting: Obstetrics and Gynecology

## 2016-08-03 VITALS — BP 116/70 | HR 60 | Ht 66.0 in | Wt 184.0 lb

## 2016-08-03 DIAGNOSIS — N939 Abnormal uterine and vaginal bleeding, unspecified: Secondary | ICD-10-CM

## 2016-08-03 DIAGNOSIS — R87618 Other abnormal cytological findings on specimens from cervix uteri: Secondary | ICD-10-CM

## 2016-08-07 ENCOUNTER — Telehealth: Payer: Self-pay | Admitting: *Deleted

## 2016-08-07 NOTE — Telephone Encounter (Signed)
Call to patient to discuss scheduling date options. Left message to call back.

## 2016-08-07 NOTE — Telephone Encounter (Signed)
-----   Message from Nunzio Cobbs, MD sent at 08/07/2016  1:37 PM EDT ----- Regarding: Please precert and schedule surgery Please precert and schedule a hysteroscopy with dilation and curettage and possible Myosure use.   Diagnosis is abnormal uterine bleeding.   Thank you!  [place]

## 2016-08-08 NOTE — Telephone Encounter (Signed)
Patient returned call. Surgery dates discussed and surgery scheduled for 08-22-16 at [facility] for 1100. Surgery instruction sheet reviewed and printed copy will be mailed to patient. Patient to call back as needed.  Routing to provider for final review. Patient agreeable to disposition. Will close encounter.

## 2016-08-14 ENCOUNTER — Other Ambulatory Visit: Payer: Self-pay | Admitting: *Deleted

## 2016-08-14 ENCOUNTER — Other Ambulatory Visit: Payer: Self-pay

## 2016-08-14 DIAGNOSIS — E063 Autoimmune thyroiditis: Secondary | ICD-10-CM

## 2016-08-14 DIAGNOSIS — R7309 Other abnormal glucose: Secondary | ICD-10-CM

## 2016-08-15 ENCOUNTER — Encounter (HOSPITAL_BASED_OUTPATIENT_CLINIC_OR_DEPARTMENT_OTHER): Payer: Self-pay | Admitting: *Deleted

## 2016-08-15 NOTE — Progress Notes (Signed)
NPO AFTER MN.  ARRIVE AT 0945.  NEEDS URINE PREG. AND EKG.  GETTING LAB WORK DONE Wednesday, 08-16-2016 (CBC, BMET).  WILL TAKE SYNTHROID AM DOS W/ SIPS OF WATER.

## 2016-08-17 ENCOUNTER — Encounter: Payer: Self-pay | Admitting: Obstetrics and Gynecology

## 2016-08-17 ENCOUNTER — Ambulatory Visit (INDEPENDENT_AMBULATORY_CARE_PROVIDER_SITE_OTHER): Payer: No Typology Code available for payment source | Admitting: Obstetrics and Gynecology

## 2016-08-17 VITALS — BP 110/72 | HR 76 | Ht 66.0 in | Wt 184.4 lb

## 2016-08-17 DIAGNOSIS — G4733 Obstructive sleep apnea (adult) (pediatric): Secondary | ICD-10-CM | POA: Diagnosis not present

## 2016-08-17 DIAGNOSIS — N939 Abnormal uterine and vaginal bleeding, unspecified: Secondary | ICD-10-CM | POA: Diagnosis not present

## 2016-08-17 DIAGNOSIS — M5030 Other cervical disc degeneration, unspecified cervical region: Secondary | ICD-10-CM | POA: Diagnosis not present

## 2016-08-17 DIAGNOSIS — Z8719 Personal history of other diseases of the digestive system: Secondary | ICD-10-CM | POA: Diagnosis not present

## 2016-08-17 DIAGNOSIS — E785 Hyperlipidemia, unspecified: Secondary | ICD-10-CM | POA: Diagnosis not present

## 2016-08-17 DIAGNOSIS — D649 Anemia, unspecified: Secondary | ICD-10-CM | POA: Diagnosis not present

## 2016-08-17 DIAGNOSIS — Z833 Family history of diabetes mellitus: Secondary | ICD-10-CM | POA: Diagnosis not present

## 2016-08-17 DIAGNOSIS — N898 Other specified noninflammatory disorders of vagina: Secondary | ICD-10-CM

## 2016-08-17 DIAGNOSIS — K219 Gastro-esophageal reflux disease without esophagitis: Secondary | ICD-10-CM | POA: Diagnosis not present

## 2016-08-17 DIAGNOSIS — M1710 Unilateral primary osteoarthritis, unspecified knee: Secondary | ICD-10-CM | POA: Diagnosis not present

## 2016-08-17 DIAGNOSIS — R7989 Other specified abnormal findings of blood chemistry: Secondary | ICD-10-CM

## 2016-08-17 DIAGNOSIS — E042 Nontoxic multinodular goiter: Secondary | ICD-10-CM | POA: Diagnosis not present

## 2016-08-17 DIAGNOSIS — D72819 Decreased white blood cell count, unspecified: Secondary | ICD-10-CM

## 2016-08-17 DIAGNOSIS — N840 Polyp of corpus uteri: Secondary | ICD-10-CM | POA: Diagnosis not present

## 2016-08-17 DIAGNOSIS — Z79899 Other long term (current) drug therapy: Secondary | ICD-10-CM | POA: Diagnosis not present

## 2016-08-17 DIAGNOSIS — Z8349 Family history of other endocrine, nutritional and metabolic diseases: Secondary | ICD-10-CM | POA: Diagnosis not present

## 2016-08-17 DIAGNOSIS — E8881 Metabolic syndrome: Secondary | ICD-10-CM | POA: Diagnosis not present

## 2016-08-17 DIAGNOSIS — E739 Lactose intolerance, unspecified: Secondary | ICD-10-CM | POA: Diagnosis not present

## 2016-08-17 DIAGNOSIS — E039 Hypothyroidism, unspecified: Secondary | ICD-10-CM | POA: Diagnosis not present

## 2016-08-17 DIAGNOSIS — Z8249 Family history of ischemic heart disease and other diseases of the circulatory system: Secondary | ICD-10-CM | POA: Diagnosis not present

## 2016-08-17 DIAGNOSIS — N920 Excessive and frequent menstruation with regular cycle: Secondary | ICD-10-CM | POA: Diagnosis not present

## 2016-08-17 DIAGNOSIS — E663 Overweight: Secondary | ICD-10-CM | POA: Diagnosis not present

## 2016-08-17 DIAGNOSIS — Z6828 Body mass index (BMI) 28.0-28.9, adult: Secondary | ICD-10-CM | POA: Diagnosis not present

## 2016-08-17 HISTORY — DX: Other specified abnormal findings of blood chemistry: R79.89

## 2016-08-17 HISTORY — DX: Decreased white blood cell count, unspecified: D72.819

## 2016-08-17 LAB — CBC
HCT: 36.7 % (ref 36.0–46.0)
Hemoglobin: 12.4 g/dL (ref 12.0–15.0)
MCH: 30 pg (ref 26.0–34.0)
MCHC: 33.8 g/dL (ref 30.0–36.0)
MCV: 88.6 fL (ref 78.0–100.0)
PLATELETS: 156 10*3/uL (ref 150–400)
RBC: 4.14 MIL/uL (ref 3.87–5.11)
RDW: 12.5 % (ref 11.5–15.5)
WBC: 3.5 10*3/uL — AB (ref 4.0–10.5)

## 2016-08-17 LAB — BASIC METABOLIC PANEL
ANION GAP: 7 (ref 5–15)
BUN: 12 mg/dL (ref 6–20)
CALCIUM: 9.1 mg/dL (ref 8.9–10.3)
CO2: 30 mmol/L (ref 22–32)
Chloride: 105 mmol/L (ref 101–111)
Creatinine, Ser: 1.03 mg/dL — ABNORMAL HIGH (ref 0.44–1.00)
GFR calc non Af Amer: 60 mL/min — ABNORMAL LOW (ref >60)
GLUCOSE: 92 mg/dL (ref 65–99)
Potassium: 4.1 mmol/L (ref 3.5–5.1)
Sodium: 142 mmol/L (ref 135–145)

## 2016-08-17 LAB — TSH: TSH: 2.738 u[IU]/mL (ref 0.350–4.500)

## 2016-08-17 LAB — T4, FREE: FREE T4: 1.08 ng/dL (ref 0.61–1.12)

## 2016-08-17 NOTE — Progress Notes (Signed)
GYNECOLOGY  VISIT   HPI: 55 y.o.   Married  [place] American  female   [phone] with No LMP recorded. Patient is not currently having periods (Reason: Perimenopausal).   here for surgical consult.    Has long standing abnormal uterine bleeding with weekly spotting.  Has had prior sonohysterogram 07/16/16 at [place] showing thickened endometrium and no specific mass.  No EMB done then.  Has had prior EMBs showing benign endometrium.   [place] 32.1 09/03/14.  Pap done here on 07/14/16 showed endometrial cells.   Having vaginal odor.   Has her preop and labs at the hospital today.   GYNECOLOGIC HISTORY: No LMP recorded. Patient is not currently having periods (Reason: Perimenopausal). Contraception:  Vasectomy Menopausal hormone therapy:  none Last mammogram: 05-18-16 Density C/Neg/BiRads1:[place] Last pap smear: 07-14-16 Neg:Neg HR HPV--endometrial cells seen;2016 neg per patient         OB History    Gravida Para Term Preterm AB Living   4 3 3   1 4    SAB TAB Ectopic Multiple Live Births   1     1 4          Patient Active Problem List   Diagnosis Date Noted  . Primary hypothyroidism 03/11/2015  . Other abnormal glucose 03/11/2015  . 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 Chest pain 03/07/2015  . Stress and adjustment reaction 03/07/2015  . Polymenorrhea 08/13/2014  . Tonsillar exudate 08/13/2014  . Pain in joint, ankle and foot 08/13/2014  . Metabolic syndrome X 27/06/5007  . Sleep disorder 02/01/2013  . Prediabetes 01/30/2013  . OA (osteoarthritis) of knee 10/03/2012  . Cervical neck pain with evidence of disc disease 09/24/2012  . Hypothyroidism 09/21/2011  . ABNORMAL ELECTROCARDIOGRAM 03/04/2010  . Anemia 09/15/2009  . LEUKOPENIA, CHRONIC 09/15/2009  . FATIGUE 09/15/2009  . Overweight (BMI 25.0-29.9) 04/24/2009  . Dyslipidemia 05/23/2007  . GERD 05/23/2007    Past Medical History:  Diagnosis Date  . Abnormal uterine bleeding   . DDD (degenerative  disc disease), cervical   . Dyslipidemia   . GERD (gastroesophageal reflux disease)   . History of esophageal dilatation    2013  . History of ovarian cyst   . Hypothyroidism   . Lactose intolerance   . OSA on CPAP    study done 04-08-2013  . Pre-diabetes   . Right thyroid nodule   . SUI (stress urinary incontinence, female)   . Wears glasses     Past Surgical History:  Procedure Laterality Date  . CESAREAN SECTION  1997   Twins-1 twin del.by c/s, 1 del.vaginally  . COLONOSCOPY  04/04/2011   Internal hemorrhoids/Nl colonoscopy-SLIGHTLY TORTUOUS COLON  . DILATION AND CURETTAGE OF UTERUS  1998   Seconday to miscarriage  . ESOPHAGOGASTRODUODENOSCOPY  09/22/2011   Mild gastritis/Hiatal hernia/DYSPHAGIA DUE TO PEPTIC STRICTURE/UNCONTROLLED GERD  . KNEE ARTHROSCOPY Right 2003  . MALONEY DILATION  09/22/2011   Procedure: MALONEY DILATION;  Surgeon: Danie Binder, MD;  [facility];  Service: Endoscopy;  Laterality: N/A;  . SAVORY DILATION  09/22/2011   Procedure: SAVORY DILATION;  Surgeon: Danie Binder, MD;  [facility];  Service: Endoscopy;  Laterality: N/A;    Current Outpatient Prescriptions  Medication Sig Dispense Refill  . calcium carbonate (TUMS - DOSED IN MG ELEMENTAL CALCIUM) 500 MG chewable tablet Chew 1 tablet by mouth as needed for indigestion or heartburn.    . levothyroxine (SYNTHROID, LEVOTHROID) 125 MCG tablet Take 1 tablet (125  mcg total) by mouth daily. (Patient taking differently: Take 125 mcg by mouth daily before breakfast. ) 30 tablet 12  . Multiple Vitamin (MULTIVITAMIN) tablet Take 1 tablet by mouth daily.       No current facility-administered medications for this visit.      ALLERGIES: Patient has no known allergies.  Family History  Problem Relation Age of Onset  . Hypertension Mother   . Angina Mother     MVP. h/o intestinal polyps possibly  . Hyperlipidemia Mother   . Transient ischemic attack Mother   . Hypertension  Father   . Hyperlipidemia Father     glucose intolerant  . Diabetes Father   . GER disease Brother   . Diabetes Brother   . Diabetes Maternal Grandfather   . Hyperlipidemia Paternal Grandmother   . Thyroid disease Paternal Grandmother   . Hyperlipidemia Paternal Grandfather   . Thyroid disease Brother     hyperthyroid  . Colon cancer Neg Hx   . Breast cancer Neg Hx   . Ovarian cancer Neg Hx   . Anesthesia problems Neg Hx     Social History   Social History  . Marital status: Married    Spouse name: N/A  . Number of children: N/A  . Years of education: N/A   Occupational History  . Full time: [employer]    Social History Main Topics  . Smoking status: Never Smoker  . Smokeless tobacco: Never Used  . Alcohol use No  . Drug use: No  . Sexual activity: Yes    Partners: Male    Birth control/ protection: Other-see comments     Comment: husband with vasectomy   Other Topics Concern  . Not on file   Social History Narrative   Married to [name], neurology   Regular exercise: No    ROS:  Pertinent items are noted in HPI.  PHYSICAL EXAMINATION:    BP 110/72 (BP Location: Right Arm, Patient Position: Sitting, Cuff Size: Normal)   Pulse 76   Ht 5\' 6"  (1.676 m)   Wt 184 lb 6.4 oz (83.6 kg)   BMI 29.76 kg/m     General appearance: alert, cooperative and appears stated age Head: Normocephalic, without obvious abnormality, atraumatic Neck: no adenopathy, supple, symmetrical, trachea midline and thyroid normal to inspection and palpation Lungs: clear to auscultation bilaterally Heart: regular rate and rhythm Abdomen: soft, non-tender, no masses,  no organomegaly Extremities: extremities normal, atraumatic, no cyanosis or edema Skin: Skin color, texture, turgor normal. No rashes or lesions Lymph nodes: Cervical, supraclavicular, and axillary nodes normal. No abnormal inguinal nodes palpated Neurologic: Grossly normal  Pelvic: External genitalia:  no lesions               Urethra:  normal appearing urethra with no masses, tenderness or lesions              Bartholins and Skenes: normal                 Vagina: normal appearing vagina with normal color and discharge, no lesions              Cervix: no lesions.  Cervix is retropubic and has some distortion.                 Bimanual Exam:  Uterus:  normal size, contour, position, consistency, mobility, non-tender              Adnexa: no mass, fullness, tenderness  Chaperone was present for exam.  ASSESSMENT  Abnormal uterine bleeding.  Vaginal odor.    PLAN  Affirm.  Proceed with hysteroscopy with Myosure and dilation and curettage.  Risks, benefits, and alternative dw patient who wishes to proceed.  Surgical expectations and recovery discussed. General labs ordered as part of her preop.  This will also include TFTs and HgbA1C.   An After Visit Summary was printed and given to the patient.  ___15___ minutes face to face time of which over 50% was spent in counseling.  Discussed potential bacterial vaginosis.

## 2016-08-18 ENCOUNTER — Other Ambulatory Visit: Payer: Self-pay | Admitting: *Deleted

## 2016-08-18 LAB — WET PREP BY MOLECULAR PROBE
CANDIDA SPECIES: NOT DETECTED
Gardnerella vaginalis: DETECTED — AB
Trichomonas vaginosis: NOT DETECTED

## 2016-08-18 LAB — HEMOGLOBIN A1C
Hgb A1c MFr Bld: 5.8 % — ABNORMAL HIGH (ref 4.8–5.6)
MEAN PLASMA GLUCOSE: 120 mg/dL

## 2016-08-18 MED ORDER — TINIDAZOLE 500 MG PO TABS: 2.0000 g | ORAL_TABLET | Freq: Every day | ORAL | 0 refills | Status: AC

## 2016-08-19 ENCOUNTER — Encounter: Payer: Self-pay | Admitting: Obstetrics and Gynecology

## 2016-08-20 NOTE — H&P (Signed)
Office Visit   08/17/2016 [place], MD  Obstetrics and Gynecology   Vaginal odor +1 more  Dx   Surgical Consult ; Referred by Fayrene Helper, MD  Reason for Visit   Additional Documentation   Vitals:   BP 110/72 (BP Location: Right Arm, Patient Position: Sitting, Cuff Size: Normal)   Pulse 76   Ht 5\' 6"  (1.676 m)   Wt 184 lb 6.4 oz (83.6 kg)   BMI 29.76 kg/m   BSA 1.97 m      More Vitals   Flowsheets:   Custom Formula Data,   MEWS Score,   Anthropometrics,   Infectious Disease Screening     Encounter Info:   Billing Info,   History,   Allergies,   Detailed Report     All Notes   Progress Notes by Nunzio Cobbs, MD at 08/17/2016 11:00 AM   Author: Nunzio Cobbs, MD Author Type: Physician Filed: 08/17/2016 4:40 PM  Note Status: Signed Cosign: Cosign Not Required Encounter Date: 08/17/2016  Editor: Nunzio Cobbs, MD (Physician)  Prior Versions: 1. Lowella Fairy, CMA (Certified Medical Assistant) at 08/17/2016 11:38 AM - Sign at close encounter    GYNECOLOGY  VISIT   HPI: 56 y.o.   Married  [place] American  female   [phone] with No LMP recorded. Patient is not currently having periods (Reason: Perimenopausal).   here for surgical consult.    Has long standing abnormal uterine bleeding with weekly spotting.  Has had prior sonohysterogram 07/16/16 at [place] showing thickened endometrium and no specific mass.  No EMB done then.  Has had prior EMBs showing benign endometrium.   [place] 32.1 09/03/14.  Pap done here on 07/14/16 showed endometrial cells.   Having vaginal odor.   Has her preop and labs at the hospital today.   GYNECOLOGIC HISTORY: No LMP recorded. Patient is not currently having periods (Reason: Perimenopausal). Contraception:  Vasectomy Menopausal hormone therapy:  none Last mammogram: 05-18-16 Density C/Neg/BiRads1:[place] Last pap smear: 07-14-16  Neg:Neg HR HPV--endometrial cells seen;2016 neg per patient                 OB History    Gravida Para Term Preterm AB Living   4 3 3   1 4    SAB TAB Ectopic Multiple Live Births   1     1 4              Patient Active Problem List   Diagnosis Date Noted  . Primary hypothyroidism 03/11/2015  . Other abnormal glucose 03/11/2015  . 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 Chest pain 03/07/2015  . Stress and adjustment reaction 03/07/2015  . Polymenorrhea 08/13/2014  . Tonsillar exudate 08/13/2014  . Pain in joint, ankle and foot 08/13/2014  . Metabolic syndrome X 67/67/2094  . Sleep disorder 02/01/2013  . Prediabetes 01/30/2013  . OA (osteoarthritis) of knee 10/03/2012  . Cervical neck pain with evidence of disc disease 09/24/2012  . Hypothyroidism 09/21/2011  . ABNORMAL ELECTROCARDIOGRAM 03/04/2010  . Anemia 09/15/2009  . LEUKOPENIA, CHRONIC 09/15/2009  . FATIGUE 09/15/2009  . Overweight (BMI 25.0-29.9) 04/24/2009  . Dyslipidemia 05/23/2007  . GERD 05/23/2007        Past Medical History:  Diagnosis Date  . Abnormal uterine bleeding   . DDD (degenerative disc disease), cervical   . Dyslipidemia   . GERD (gastroesophageal reflux disease)   .  History of esophageal dilatation    2013  . History of ovarian cyst   . Hypothyroidism   . Lactose intolerance   . OSA on CPAP    study done 04-08-2013  . Pre-diabetes   . Right thyroid nodule   . SUI (stress urinary incontinence, female)   . Wears glasses          Past Surgical History:  Procedure Laterality Date  . CESAREAN SECTION  1997   Twins-1 twin del.by c/s, 1 del.vaginally  . COLONOSCOPY  04/04/2011   Internal hemorrhoids/Nl colonoscopy-SLIGHTLY TORTUOUS COLON  . DILATION AND CURETTAGE OF UTERUS  1998   Seconday to miscarriage  . ESOPHAGOGASTRODUODENOSCOPY  09/22/2011   Mild gastritis/Hiatal hernia/DYSPHAGIA DUE TO PEPTIC STRICTURE/UNCONTROLLED GERD  . KNEE ARTHROSCOPY  Right 2003  . MALONEY DILATION  09/22/2011   Procedure: MALONEY DILATION;  Surgeon: Danie Binder, MD;  [facility];  Service: Endoscopy;  Laterality: N/A;  . SAVORY DILATION  09/22/2011   Procedure: SAVORY DILATION;  Surgeon: Danie Binder, MD;  [facility];  Service: Endoscopy;  Laterality: N/A;          Current Outpatient Prescriptions  Medication Sig Dispense Refill  . calcium carbonate (TUMS - DOSED IN MG ELEMENTAL CALCIUM) 500 MG chewable tablet Chew 1 tablet by mouth as needed for indigestion or heartburn.    . levothyroxine (SYNTHROID, LEVOTHROID) 125 MCG tablet Take 1 tablet (125 mcg total) by mouth daily. (Patient taking differently: Take 125 mcg by mouth daily before breakfast. ) 30 tablet 12  . Multiple Vitamin (MULTIVITAMIN) tablet Take 1 tablet by mouth daily.       No current facility-administered medications for this visit.      ALLERGIES: Patient has no known allergies.        Family History  Problem Relation Age of Onset  . Hypertension Mother   . Angina Mother     MVP. h/o intestinal polyps possibly  . Hyperlipidemia Mother   . Transient ischemic attack Mother   . Hypertension Father   . Hyperlipidemia Father     glucose intolerant  . Diabetes Father   . GER disease Brother   . Diabetes Brother   . Diabetes Maternal Grandfather   . Hyperlipidemia Paternal Grandmother   . Thyroid disease Paternal Grandmother   . Hyperlipidemia Paternal Grandfather   . Thyroid disease Brother     hyperthyroid  . Colon cancer Neg Hx   . Breast cancer Neg Hx   . Ovarian cancer Neg Hx   . Anesthesia problems Neg Hx     Social History        Social History  . Marital status: Married    Spouse name: N/A  . Number of children: N/A  . Years of education: N/A       Occupational History  . Full time: [employer]          Social History Main Topics  . Smoking status: Never Smoker  . Smokeless tobacco:  Never Used  . Alcohol use No  . Drug use: No  . Sexual activity: Yes    Partners: Male    Birth control/ protection: Other-see comments     Comment: husband with vasectomy       Other Topics Concern  . Not on file      Social History Narrative   Married to [name], neurology   Regular exercise: No    ROS:  Pertinent items are noted in HPI.  PHYSICAL EXAMINATION:    BP 110/72 (BP Location: Right Arm, Patient Position: Sitting, Cuff Size: Normal)   Pulse 76   Ht 5\' 6"  (1.676 m)   Wt 184 lb 6.4 oz (83.6 kg)   BMI 29.76 kg/m     General appearance: alert, cooperative and appears stated age Head: Normocephalic, without obvious abnormality, atraumatic Neck: no adenopathy, supple, symmetrical, trachea midline and thyroid normal to inspection and palpation Lungs: clear to auscultation bilaterally Heart: regular rate and rhythm Abdomen: soft, non-tender, no masses,  no organomegaly Extremities: extremities normal, atraumatic, no cyanosis or edema Skin: Skin color, texture, turgor normal. No rashes or lesions Lymph nodes: Cervical, supraclavicular, and axillary nodes normal. No abnormal inguinal nodes palpated Neurologic: Grossly normal  Pelvic: External genitalia:  no lesions              Urethra:  normal appearing urethra with no masses, tenderness or lesions              Bartholins and Skenes: normal                 Vagina: normal appearing vagina with normal color and discharge, no lesions              Cervix: no lesions.  Cervix is retropubic and has some distortion.                 Bimanual Exam:  Uterus:  normal size, contour, position, consistency, mobility, non-tender              Adnexa: no mass, fullness, tenderness               Chaperone was present for exam.  ASSESSMENT  Abnormal uterine bleeding.  Vaginal odor.    PLAN  Affirm.  Proceed with hysteroscopy with Myosure and dilation and curettage.  Risks, benefits, and  alternative dw patient who wishes to proceed.  Surgical expectations and recovery discussed. General labs ordered as part of her preop.  This will also include TFTs and HgbA1C.   An After Visit Summary was printed and given to the patient.  ___15___ minutes face to face time of which over 50% was spent in counseling.  Discussed potential bacterial vaginosis.

## 2016-08-21 ENCOUNTER — Encounter: Payer: Self-pay | Admitting: Obstetrics and Gynecology

## 2016-08-21 ENCOUNTER — Telehealth: Payer: Self-pay | Admitting: *Deleted

## 2016-08-21 NOTE — Telephone Encounter (Signed)
Call to patient. Advised of issue with precert for surgical  procedure scheduled for tomorrow. Options discussed with patient. She prefers to delay procedure till Thursday 08-24-16 awaiting completed precert. Surgery rescheduled to 08-24-16 at [facility]

## 2016-08-22 NOTE — Telephone Encounter (Signed)
Call to patient. Advised surgery precert with [facility] has now been completed and she is confirmed for 0730 on 08-24-16 at [facility]. Patient agreeable.  Routing to provider for final review. Patient agreeable to disposition. Will close encounter.

## 2016-08-23 ENCOUNTER — Encounter (HOSPITAL_COMMUNITY): Payer: Self-pay | Admitting: Anesthesiology

## 2016-08-23 NOTE — Anesthesia Preprocedure Evaluation (Addendum)
Anesthesia Evaluation  Patient identified by MRN, date of birth, ID band Patient awake    Reviewed: Allergy & Precautions, NPO status , Patient's Chart, lab work & pertinent test results  Airway Mallampati: I       Dental no notable dental hx.    Pulmonary    Pulmonary exam normal        Cardiovascular Normal cardiovascular exam Rhythm:Regular Rate:Normal     Neuro/Psych negative neurological ROS     GI/Hepatic   Endo/Other    Renal/GU negative Renal ROS  negative genitourinary   Musculoskeletal   Abdominal Normal abdominal exam  (+)   Peds  Hematology   Anesthesia Other Findings   Reproductive/Obstetrics                             Anesthesia Physical Anesthesia Plan  ASA: II  Anesthesia Plan: General   Post-op Pain Management:    Induction: Intravenous  Airway Management Planned: LMA  Additional Equipment:   Intra-op Plan:   Post-operative Plan:   Informed Consent: I have reviewed the patients History and Physical, chart, labs and discussed the procedure including the risks, benefits and alternatives for the proposed anesthesia with the patient or authorized representative who has indicated his/her understanding and acceptance.   Dental advisory given  Plan Discussed with: CRNA and Surgeon  Anesthesia Plan Comments:        Anesthesia Quick Evaluation

## 2016-08-24 ENCOUNTER — Encounter (HOSPITAL_COMMUNITY)
Admission: RE | Disposition: A | Discharge status: Home / Self Care | Payer: Self-pay | Source: Ambulatory Visit | Attending: Obstetrics and Gynecology

## 2016-08-24 ENCOUNTER — Ambulatory Visit (HOSPITAL_COMMUNITY)
Admission: RE | Admit: 2016-08-24 | Discharge: 2016-08-24 | Disposition: A | Discharge status: Home / Self Care | Payer: 59 | Source: Ambulatory Visit | Attending: Obstetrics and Gynecology | Admitting: Obstetrics and Gynecology

## 2016-08-24 ENCOUNTER — Encounter (HOSPITAL_COMMUNITY): Payer: Self-pay

## 2016-08-24 ENCOUNTER — Ambulatory Visit (HOSPITAL_COMMUNITY): Payer: 59 | Admitting: Anesthesiology

## 2016-08-24 DIAGNOSIS — M5030 Other cervical disc degeneration, unspecified cervical region: Secondary | ICD-10-CM | POA: Insufficient documentation

## 2016-08-24 DIAGNOSIS — Z79899 Other long term (current) drug therapy: Secondary | ICD-10-CM | POA: Insufficient documentation

## 2016-08-24 DIAGNOSIS — Z6828 Body mass index (BMI) 28.0-28.9, adult: Secondary | ICD-10-CM | POA: Insufficient documentation

## 2016-08-24 DIAGNOSIS — Z8719 Personal history of other diseases of the digestive system: Secondary | ICD-10-CM | POA: Insufficient documentation

## 2016-08-24 DIAGNOSIS — K219 Gastro-esophageal reflux disease without esophagitis: Secondary | ICD-10-CM | POA: Insufficient documentation

## 2016-08-24 DIAGNOSIS — E039 Hypothyroidism, unspecified: Secondary | ICD-10-CM | POA: Diagnosis not present

## 2016-08-24 DIAGNOSIS — D72819 Decreased white blood cell count, unspecified: Secondary | ICD-10-CM | POA: Insufficient documentation

## 2016-08-24 DIAGNOSIS — N920 Excessive and frequent menstruation with regular cycle: Secondary | ICD-10-CM | POA: Insufficient documentation

## 2016-08-24 DIAGNOSIS — Z8249 Family history of ischemic heart disease and other diseases of the circulatory system: Secondary | ICD-10-CM | POA: Insufficient documentation

## 2016-08-24 DIAGNOSIS — N840 Polyp of corpus uteri: Secondary | ICD-10-CM | POA: Diagnosis not present

## 2016-08-24 DIAGNOSIS — E739 Lactose intolerance, unspecified: Secondary | ICD-10-CM | POA: Insufficient documentation

## 2016-08-24 DIAGNOSIS — E8881 Metabolic syndrome: Secondary | ICD-10-CM | POA: Insufficient documentation

## 2016-08-24 DIAGNOSIS — G4733 Obstructive sleep apnea (adult) (pediatric): Secondary | ICD-10-CM | POA: Insufficient documentation

## 2016-08-24 DIAGNOSIS — Z8349 Family history of other endocrine, nutritional and metabolic diseases: Secondary | ICD-10-CM | POA: Insufficient documentation

## 2016-08-24 DIAGNOSIS — Z833 Family history of diabetes mellitus: Secondary | ICD-10-CM | POA: Insufficient documentation

## 2016-08-24 DIAGNOSIS — D649 Anemia, unspecified: Secondary | ICD-10-CM | POA: Insufficient documentation

## 2016-08-24 DIAGNOSIS — E663 Overweight: Secondary | ICD-10-CM | POA: Insufficient documentation

## 2016-08-24 DIAGNOSIS — R87619 Unspecified abnormal cytological findings in specimens from cervix uteri: Secondary | ICD-10-CM | POA: Diagnosis not present

## 2016-08-24 DIAGNOSIS — N939 Abnormal uterine and vaginal bleeding, unspecified: Secondary | ICD-10-CM | POA: Diagnosis not present

## 2016-08-24 DIAGNOSIS — E042 Nontoxic multinodular goiter: Secondary | ICD-10-CM | POA: Insufficient documentation

## 2016-08-24 DIAGNOSIS — E785 Hyperlipidemia, unspecified: Secondary | ICD-10-CM | POA: Insufficient documentation

## 2016-08-24 DIAGNOSIS — M1710 Unilateral primary osteoarthritis, unspecified knee: Secondary | ICD-10-CM | POA: Insufficient documentation

## 2016-08-24 HISTORY — DX: Other specified postprocedural states: Z98.890

## 2016-08-24 HISTORY — DX: Dependence on other enabling machines and devices: Z99.89

## 2016-08-24 HISTORY — DX: Prediabetes: R73.03

## 2016-08-24 HISTORY — DX: Other cervical disc degeneration, unspecified cervical region: M50.30

## 2016-08-24 HISTORY — DX: Personal history of other diseases of the female genital tract: Z87.42

## 2016-08-24 HISTORY — DX: Obstructive sleep apnea (adult) (pediatric): G47.33

## 2016-08-24 HISTORY — DX: Hyperlipidemia, unspecified: E78.5

## 2016-08-24 HISTORY — DX: Stress incontinence (female) (male): N39.3

## 2016-08-24 HISTORY — PX: DILATATION & CURETTAGE/HYSTEROSCOPY WITH MYOSURE: SHX6511

## 2016-08-24 HISTORY — DX: Presence of spectacles and contact lenses: Z97.3

## 2016-08-24 HISTORY — DX: Nontoxic single thyroid nodule: E04.1

## 2016-08-24 LAB — PREGNANCY, URINE: Preg Test, Ur: NEGATIVE

## 2016-08-24 SURGERY — DILATATION & CURETTAGE/HYSTEROSCOPY WITH MYOSURE
Anesthesia: General | Site: Vagina

## 2016-08-24 MED ORDER — MIDAZOLAM HCL 2 MG/2ML IJ SOLN
INTRAMUSCULAR | Status: AC
  Filled 2016-08-24: qty 2

## 2016-08-24 MED ORDER — FENTANYL CITRATE (PF) 100 MCG/2ML IJ SOLN: 25.0000 ug | INTRAMUSCULAR | Status: DC | PRN

## 2016-08-24 MED ORDER — LIDOCAINE HCL 1 % IJ SOLN
INTRAMUSCULAR | Status: AC
  Filled 2016-08-24: qty 20

## 2016-08-24 MED ORDER — SCOPOLAMINE 1 MG/3DAYS TD PT72
MEDICATED_PATCH | TRANSDERMAL | Status: AC
  Administered 2016-08-24: 1.5 mg via TRANSDERMAL
  Filled 2016-08-24: qty 1

## 2016-08-24 MED ORDER — SCOPOLAMINE 1 MG/3DAYS TD PT72
MEDICATED_PATCH | TRANSDERMAL | Status: AC
  Filled 2016-08-24: qty 1

## 2016-08-24 MED ORDER — ONDANSETRON HCL 4 MG/2ML IJ SOLN: 4.0000 mg | Freq: Once | INTRAMUSCULAR | Status: DC | PRN

## 2016-08-24 MED ORDER — PROPOFOL 10 MG/ML IV BOLUS
INTRAVENOUS | Status: DC | PRN
  Administered 2016-08-24: 150 mg via INTRAVENOUS

## 2016-08-24 MED ORDER — ACETAMINOPHEN 325 MG PO TABS: 325.0000 mg | ORAL_TABLET | ORAL | Status: DC | PRN

## 2016-08-24 MED ORDER — PROPOFOL 10 MG/ML IV BOLUS
INTRAVENOUS | Status: AC
  Filled 2016-08-24: qty 20

## 2016-08-24 MED ORDER — KETOROLAC TROMETHAMINE 30 MG/ML IJ SOLN: 30.0000 mg | Freq: Once | INTRAMUSCULAR | Status: DC | PRN

## 2016-08-24 MED ORDER — FENTANYL CITRATE (PF) 100 MCG/2ML IJ SOLN
INTRAMUSCULAR | Status: AC
  Filled 2016-08-24: qty 2

## 2016-08-24 MED ORDER — ONDANSETRON HCL 4 MG/2ML IJ SOLN
INTRAMUSCULAR | Status: AC
  Filled 2016-08-24: qty 2

## 2016-08-24 MED ORDER — MIDAZOLAM HCL 2 MG/2ML IJ SOLN
INTRAMUSCULAR | Status: DC | PRN
  Administered 2016-08-24: 2 mg via INTRAVENOUS

## 2016-08-24 MED ORDER — SODIUM CHLORIDE 0.9 % IR SOLN
Status: DC | PRN
  Administered 2016-08-24: 3000 mL

## 2016-08-24 MED ORDER — EPHEDRINE SULFATE 50 MG/ML IJ SOLN
INTRAMUSCULAR | Status: DC | PRN
  Administered 2016-08-24: 5 mg via INTRAVENOUS

## 2016-08-24 MED ORDER — FENTANYL CITRATE (PF) 100 MCG/2ML IJ SOLN
INTRAMUSCULAR | Status: DC | PRN
  Administered 2016-08-24: 100 ug via INTRAVENOUS

## 2016-08-24 MED ORDER — SCOPOLAMINE 1 MG/3DAYS TD PT72
1.0000 | MEDICATED_PATCH | Freq: Once | TRANSDERMAL | Status: AC
  Administered 2016-08-24: 1.5 mg via TRANSDERMAL
  Administered 2016-08-24: 1 via TRANSDERMAL

## 2016-08-24 MED ORDER — KETOROLAC TROMETHAMINE 30 MG/ML IJ SOLN
INTRAMUSCULAR | Status: DC | PRN
  Administered 2016-08-24: 30 mg via INTRAVENOUS

## 2016-08-24 MED ORDER — IBUPROFEN 800 MG PO TABS: 800.0000 mg | ORAL_TABLET | Freq: Three times a day (TID) | ORAL | 0 refills | Status: DC | PRN

## 2016-08-24 MED ORDER — OXYCODONE HCL 5 MG/5ML PO SOLN: 5.0000 mg | Freq: Once | ORAL | Status: DC | PRN

## 2016-08-24 MED ORDER — LACTATED RINGERS IV SOLN
INTRAVENOUS | Status: DC
  Administered 2016-08-24: 125 mL/h via INTRAVENOUS

## 2016-08-24 MED ORDER — LIDOCAINE HCL (CARDIAC) 20 MG/ML IV SOLN
INTRAVENOUS | Status: AC
  Filled 2016-08-24: qty 5

## 2016-08-24 MED ORDER — OXYCODONE HCL 5 MG PO TABS: 5.0000 mg | ORAL_TABLET | Freq: Once | ORAL | Status: DC | PRN

## 2016-08-24 MED ORDER — ACETAMINOPHEN 160 MG/5ML PO SOLN: 325.0000 mg | ORAL | Status: DC | PRN

## 2016-08-24 MED ORDER — LIDOCAINE HCL (CARDIAC) 20 MG/ML IV SOLN
INTRAVENOUS | Status: DC | PRN
  Administered 2016-08-24: 100 mg via INTRAVENOUS

## 2016-08-24 MED ORDER — MEPERIDINE HCL 25 MG/ML IJ SOLN: 6.2500 mg | INTRAMUSCULAR | Status: DC | PRN

## 2016-08-24 SURGICAL SUPPLY — 20 items
CANISTER SUCT 3000ML PPV (MISCELLANEOUS) ×3 IMPLANT
CATH ROBINSON RED A/P 16FR (CATHETERS) ×3 IMPLANT
CLOTH BEACON ORANGE TIMEOUT ST (SAFETY) ×3 IMPLANT
CONTAINER PREFILL 10% NBF 60ML (FORM) ×6 IMPLANT
DEVICE MYOSURE LITE (MISCELLANEOUS) ×2 IMPLANT
DEVICE MYOSURE REACH (MISCELLANEOUS) IMPLANT
ELECT REM PT RETURN 9FT ADLT (ELECTROSURGICAL)
ELECTRODE REM PT RTRN 9FT ADLT (ELECTROSURGICAL) IMPLANT
FILTER ARTHROSCOPY CONVERTOR (FILTER) ×3 IMPLANT
GLOVE BIO SURGEON STRL SZ 6.5 (GLOVE) ×2 IMPLANT
GLOVE BIO SURGEONS STRL SZ 6.5 (GLOVE) ×1
GLOVE BIOGEL PI IND STRL 7.0 (GLOVE) ×2 IMPLANT
GLOVE BIOGEL PI INDICATOR 7.0 (GLOVE) ×4
GOWN STRL REUS W/TWL LRG LVL3 (GOWN DISPOSABLE) ×6 IMPLANT
PACK VAGINAL MINOR WOMEN LF (CUSTOM PROCEDURE TRAY) ×3 IMPLANT
PAD OB MATERNITY 4.3X12.25 (PERSONAL CARE ITEMS) ×3 IMPLANT
SEAL ROD LENS SCOPE MYOSURE (ABLATOR) ×3 IMPLANT
TOWEL OR 17X24 6PK STRL BLUE (TOWEL DISPOSABLE) ×6 IMPLANT
TUBING AQUILEX INFLOW (TUBING) ×3 IMPLANT
TUBING AQUILEX OUTFLOW (TUBING) ×3 IMPLANT

## 2016-08-24 NOTE — Transfer of Care (Signed)
Immediate Anesthesia Transfer of Care Note  Patient: Stacy [name]  Procedure(s) Performed: Procedure(s): DILATATION & CURETTAGE/HYSTEROSCOPY WITH MYOSURE Possible Myosure  (N/A)  Patient Location: PACU  Anesthesia Type:General  Level of Consciousness: awake, alert  and oriented  Airway & Oxygen Therapy: Patient Spontanous Breathing and Patient connected to nasal cannula oxygen  Post-op Assessment: Report given to RN and Post -op Vital signs reviewed and stable  Post vital signs: Reviewed and stable  Last Vitals:  Vitals:   08/24/16 0641  BP: 114/77  Pulse: 63  Resp: 16  Temp: 36.5 C    Last Pain:  Vitals:   08/24/16 0641  TempSrc: Oral      Patients Stated Pain Goal: 4 (83/37/44 5146)  Complications: No apparent anesthesia complications

## 2016-08-24 NOTE — Anesthesia Procedure Notes (Signed)
Procedure Name: LMA Insertion Date/Time: 08/24/2016 7:31 AM Performed by: Maggi Hershkowitz, Sheron Nightingale Pre-anesthesia Checklist: Patient identified, Emergency Drugs available, Suction available, Patient being monitored and Timeout performed Oxygen Delivery Method: Circle system utilized Preoxygenation: Pre-oxygenation with 100% oxygen Intubation Type: IV induction LMA: LMA inserted Grade View: Grade I Tube size: 7.0 mm Number of attempts: 1 Dental Injury: Teeth and Oropharynx as per pre-operative assessment

## 2016-08-24 NOTE — Brief Op Note (Signed)
08/24/2016  8:07 AM  PATIENT:  Stacy [name]  56 y.o. female  PRE-OPERATIVE DIAGNOSIS:  AUB, endometrial cells on pap  POST-OPERATIVE DIAGNOSIS:  AUB, endometrial cells on pap  PROCEDURE:  Procedure(s): DILATATION & CURETTAGE/HYSTEROSCOPY WITH MYOSURE Possible Myosure  (N/A)  SURGEON:  Surgeon(s) and Role:    * Kendarious Gudino E Yisroel Ramming, MD - Primary  PHYSICIAN ASSISTANT: NA  ASSISTANTS: none   ANESTHESIA:   LMA  EBL:  Total I/O In: -  Out: 75 [Urine:70; Blood:5]  NS deficit- 250 cc.   BLOOD ADMINISTERED:none  DRAINS: none   LOCAL MEDICATIONS USED:  NONE  SPECIMEN:  Source of Specimen:  Endometrial polyp, endometrial curettings  DISPOSITION OF SPECIMEN:  PATHOLOGY  COUNTS:  YES  TOURNIQUET:  * No tourniquets in log *  DICTATION: .Note written in EPIC  PLAN OF CARE: Discharge to home after PACU  PATIENT DISPOSITION:  PACU - hemodynamically stable.   Delay start of Pharmacological VTE agent (>24hrs) due to surgical blood loss or risk of bleeding: not applicable

## 2016-08-24 NOTE — Progress Notes (Signed)
Update to History and Physical  No marked change in status since office preop visit.   Patient examined.   Ok to proceed.  

## 2016-08-24 NOTE — Discharge Instructions (Signed)
Hello Dr. Katharine Look!  I found and removed a large endometrial polyp!  I also did a curettage of the endometrial walls.  You did well with your procedure today.  Please call if you need anything.  Josefa Half, MD   Hysteroscopy, Care After Refer to this sheet in the next few weeks. These instructions provide you with information on caring for yourself after your procedure. Your health care provider may also give you more specific instructions. Your treatment has been planned according to current medical practices, but problems sometimes occur. Call your health care provider if you have any problems or questions after your procedure. What can I expect after the procedure? After your procedure, it is typical to have the following:  You may have some cramping. This normally lasts for a couple days.  You may have bleeding. This can vary from light spotting for a few days to menstrual-like bleeding for 3-7 days. Follow these instructions at home:  Rest for the first 1-2 days after the procedure.  Only take over-the-counter or prescription medicines as directed by your health care provider. Do not take aspirin. It can increase the chances of bleeding.  Take showers instead of baths for 2 weeks or as directed by your health care provider.  Do not drive for 24 hours or as directed.  Do not drink alcohol while taking pain medicine.  Do not use tampons, douche, or have sexual intercourse for 2 weeks or until your health care provider says it is okay.  Take your temperature twice a day for 4-5 days. Write it down each time.  Follow your health care provider's advice about diet, exercise, and lifting.  If you develop constipation, you may:  Take a mild laxative if your health care provider approves.  Add bran foods to your diet.  Drink enough fluids to keep your urine clear or pale yellow.  Try to have someone with you or available to you for the first 24-48 hours, especially if you were  given a general anesthetic.  Follow up with your health care provider as directed. Contact a health care provider if:  You feel dizzy or lightheaded.  You feel sick to your stomach (nauseous).  You have abnormal vaginal discharge.  You have a rash.  You have pain that is not controlled with medicine. Get help right away if:  You have bleeding that is heavier than a normal menstrual period.  You have a fever.  You have increasing cramps or pain, not controlled with medicine.  You have new belly (abdominal) pain.  You pass out.  You have pain in the tops of your shoulders (shoulder strap areas).  You have shortness of breath. This information is not intended to replace advice given to you by your health care provider. Make sure you discuss any questions you have with your health care provider. Document Released: 01/29/2013 Document Revised: 09/16/2015 Document Reviewed: 11/07/2012 Elsevier Interactive Patient Education  2017 [employer]   Dilation and Curettage or Vacuum Curettage, Care After This sheet gives you information about how to care for yourself after your procedure. Your health care provider may also give you more specific instructions. If you have problems or questions, contact your health care provider. What can I expect after the procedure? After your procedure, it is common to have:  Mild pain or cramping.  Some vaginal bleeding or spotting. These may last for up to 2 weeks after your procedure. Follow these instructions at home: Activity    Do  not drive or use heavy machinery while taking prescription pain medicine.  Avoid driving for the first 24 hours after your procedure.  Take frequent, short walks, followed by rest periods, throughout the day. Ask your health care provider what activities are safe for you. After 1-2 days, you may be able to return to your normal activities.  Do not lift anything heavier than 10 lb (4.5 kg) until your health  care provider approves.  For at least 2 weeks, or as long as told by your health care provider, do not:  Douche.  Use tampons.  Have sexual intercourse. General instructions    Take over-the-counter and prescription medicines only as told by your health care provider. This is especially important if you take blood thinning medicine.  Do not take baths, swim, or use a hot tub until your health care provider approves. Take showers instead of baths.  Wear compression stockings as told by your health care provider. These stockings help to prevent blood clots and reduce swelling in your legs.  It is your responsibility to get the results of your procedure. Ask your health care provider, or the department performing the procedure, when your results will be ready.  Keep all follow-up visits as told by your health care provider. This is important. Contact a health care provider if:  You have severe cramps that get worse or that do not get better with medicine.  You have severe abdominal pain.  You cannot drink fluids without vomiting.  You develop pain in a different area of your pelvis.  You have bad-smelling vaginal discharge.  You have a rash. Get help right away if:  You have vaginal bleeding that soaks more than one sanitary pad in 1 hour, for 2 hours in a row.  You pass large blood clots from your vagina.  You have a fever that is above 100.77F (38.0C).  Your abdomen feels very tender or hard.  You have chest pain.  You have shortness of breath.  You cough up blood.  You feel dizzy or light-headed.  You faint.  You have pain in your neck or shoulder area. This information is not intended to replace advice given to you by your health care provider. Make sure you discuss any questions you have with your health care provider.    Post Anesthesia Home Care Instructions  Activity: Get plenty of rest for the remainder of the day. A responsible individual must  stay with you for 24 hours following the procedure.  For the next 24 hours, DO NOT: -Drive a car -Paediatric nurse -Drink alcoholic beverages -Take any medication unless instructed by your physician -Make any legal decisions or sign important papers.  Meals: Start with liquid foods such as gelatin or soup. Progress to regular foods as tolerated. Avoid greasy, spicy, heavy foods. If nausea and/or vomiting occur, drink only clear liquids until the nausea and/or vomiting subsides. Call your physician if vomiting continues.  Special Instructions/Symptoms: Your throat may feel dry or sore from the anesthesia or the breathing tube placed in your throat during surgery. If this causes discomfort, gargle with warm salt water. The discomfort should disappear within 24 hours.  If you had a scopolamine patch placed behind your ear for the management of post- operative nausea and/or vomiting:  1. The medication in the patch is effective for 72 hours, after which it should be removed.  Wrap patch in a tissue and discard in the trash. Wash hands thoroughly with soap and water. 2.  You may remove the patch earlier than 72 hours if you experience unpleasant side effects which may include dry mouth, dizziness or visual disturbances. 3. Avoid touching the patch. Wash your hands with soap and water after contact with the patch.

## 2016-08-24 NOTE — Anesthesia Postprocedure Evaluation (Addendum)
Anesthesia Post Note  Patient: Stacy [name]  Procedure(s) Performed: Procedure(s) (LRB): DILATATION & CURETTAGE/HYSTEROSCOPY WITH MYOSURE Possible Myosure  (N/A)  Patient location during evaluation: PACU Anesthesia Type: General Level of consciousness: awake Pain management: pain level controlled Respiratory status: spontaneous breathing Cardiovascular status: stable Postop Assessment: no signs of nausea or vomiting Anesthetic complications: no        Last Vitals:  Vitals:   08/24/16 0930 08/24/16 1000  BP: 128/72 124/66  Pulse: 87 67  Resp: 16 16  Temp:  36.6 C    Last Pain:  Vitals:   08/24/16 0641  TempSrc: Oral   Pain Goal: Patients Stated Pain Goal: 4 (08/24/16 0845)               [place]

## 2016-08-24 NOTE — Op Note (Signed)
OPERATIVE REPORT   PREOPERATIVE DIAGNOSES:   Abnormal uterine bleeding, endometrial cells on pap  POSTOPERATIVE DIAGNOSES:    Abnormal uterine bleeding, endometrial cells on pap, endometrial polyp  PROCEDURE:  Hysteroscopy with dilation and curettage and Myosure resection of endometrial polyp  SURGEON:  Lenard Galloway, MD  ANESTHESIA:  LMA.  IV FLUIDS:   ---  EBL:  5 cc.  URINE OUTPUT:  70 cc.  NORMAL SALINE DEFICIT:   250 cc.   COMPLICATIONS:  None.  INDICATIONS FOR THE PROCEDURE:    The patient is a G6P4 African American female who presents with abnormal uterine bleeding and thickened endometrium on ultrasound. Recent pap showed endometrial cells.  She has had multiple prior ultrasounds and benign endometrial biopsies at an outside office.  A plan is now made to proceed with a hysteroscopy with possible Myosure and dilation and curettage.  Risks, benefits, and alternatives are reviewed with the patient who wishes to proceed.   FINDINGS:  Exam under anesthesia revealed a small anteverted uterus. The cervix was somewhat flush with the vagina and very anterior at the vaginal apex. The uterus was sounded to 7 cm. Hysteroscopy showed a large endometrial polyp attached to the posterior uterine fundus. Endometrial currettings were scant.  SPECIMENS:  The endometrial polyp and endometrial curettings were sent to Pathology separately.  PROCEDURE IN DETAIL:  The patient was reidentified in the preoperative hold area.  She received TED hose and PAS stockings for DVT prophylaxis.  In the operating room, the patient was placed in the dorsal lithotomy position and then an LMA anesthetic was introduced.  The patient's lower abdomen, vagina and perineum were sterilely prepped with Betadine and the  patient's bladder was catheterized of urine.  She was sterilly draped.  An exam under anesthesia was performed.  A speculum was placed inside the vagina and a  single-tooth tenaculum was placed on the anterior cervical lip.   The uterus was sounded. The cervix was dilated to a #23 Pratt dilator.  The MyoSure hysteroscope was then inserted inside the uterine cavity under the continuous infusion of normal saline solution.  Findings are as noted above.  The MyoSure hysteroscope was used to completely resect the endometrial polyp which was  sent to pathology. The scope was then removed.  A serrated curette were introduced  into the uterine cavity and the endometrium was curetted in all 4 quadrants.  A minimal amount of endometrial curettings was obtained.  This specimen was sent to Pathology.  The single-tooth tenaculum which had been placed on the anterior cervical lip was removed.  Hemostasis was good, and all of the vaginal  instruments were removed.  The patient was awakened and escorted to the recovery room in stable condition after she was cleansed with Betadine.  There were no complications to the procedure.  All needle, instrument and sponge counts were correct.  Lenard Galloway, MD

## 2016-08-25 ENCOUNTER — Encounter (HOSPITAL_COMMUNITY): Payer: Self-pay | Admitting: Obstetrics and Gynecology

## 2016-08-28 ENCOUNTER — Telehealth: Payer: Self-pay

## 2016-08-28 NOTE — Telephone Encounter (Signed)
Left detailed message at number provided [phone], okay per ROI. Advised of results as seen below from [place]. Advised to return call to the office with any further questions.  Routing to provider for final review. Patient agreeable to disposition. Will close encounter.

## 2016-08-28 NOTE — Telephone Encounter (Signed)
-----   Message from Nunzio Cobbs, MD sent at 08/28/2016 10:25 AM EDT ----- Please report final surgical pathology showing a benign endometrial polyp. 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

## 2016-09-07 ENCOUNTER — Ambulatory Visit: Payer: No Typology Code available for payment source | Admitting: Obstetrics and Gynecology

## 2016-09-14 ENCOUNTER — Encounter: Payer: Self-pay | Admitting: Obstetrics and Gynecology

## 2016-09-14 ENCOUNTER — Ambulatory Visit (INDEPENDENT_AMBULATORY_CARE_PROVIDER_SITE_OTHER): Payer: No Typology Code available for payment source | Admitting: Obstetrics and Gynecology

## 2016-09-14 VITALS — BP 110/60 | HR 92 | Resp 16 | Wt 185.0 lb

## 2016-09-14 DIAGNOSIS — Z9889 Other specified postprocedural states: Secondary | ICD-10-CM

## 2016-09-14 DIAGNOSIS — N840 Polyp of corpus uteri: Secondary | ICD-10-CM

## 2016-09-14 NOTE — Progress Notes (Signed)
GYNECOLOGY  VISIT   HPI: 56 y.o.   Married  [place] American  female   [phone] with No LMP recorded. Patient is not currently having periods (Reason: Perimenopausal).   here for  2 week Post op DILATATION & CURETTAGE/HYSTEROSCOPY WITH MYOSURE Possible Myosure (N/A Vagina )   Final pathology - benign polyp.  Did well after surgery without problems.  GYNECOLOGIC HISTORY: No LMP recorded. Patient is not currently having periods (Reason: Perimenopausal). Contraception:  Vasectomy Menopausal hormone therapy:  none Last mammogram:  05-18-16 Density C/Neg/BiRads1:[place] Last pap smear:   07-14-16 Neg:Neg HR HPV--endometrial cells seen;2016 neg per patient         OB History    Gravida Para Term Preterm AB Living   4 3 3   1 4    SAB TAB Ectopic Multiple Live Births   1     1 4          Patient Active Problem List   Diagnosis Date Noted  . Primary hypothyroidism 03/11/2015  . Other abnormal glucose 03/11/2015  . 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 Chest pain 03/07/2015  . Stress and adjustment reaction 03/07/2015  . Polymenorrhea 08/13/2014  . Tonsillar exudate 08/13/2014  . Pain in joint, ankle and foot 08/13/2014  . Metabolic syndrome X 93/81/8299  . Sleep disorder 02/01/2013  . Prediabetes 01/30/2013  . OA (osteoarthritis) of knee 10/03/2012  . Cervical neck pain with evidence of disc disease 09/24/2012  . Hypothyroidism 09/21/2011  . ABNORMAL ELECTROCARDIOGRAM 03/04/2010  . Anemia 09/15/2009  . LEUKOPENIA, CHRONIC 09/15/2009  . FATIGUE 09/15/2009  . Overweight (BMI 25.0-29.9) 04/24/2009  . Dyslipidemia 05/23/2007  . GERD 05/23/2007    Past Medical History:  Diagnosis Date  . Abnormal uterine bleeding   . Chronic leukopenia 08/17/2016   WBC = 3.5  . DDD (degenerative disc disease), cervical   . Dyslipidemia   . Elevated serum creatinine 08/17/2016  . GERD (gastroesophageal reflux disease)   . History of esophageal dilatation    2013  . History of  ovarian cyst   . Hypothyroidism   . Lactose intolerance   . OSA on CPAP    study done 04-08-2013  . Pre-diabetes   . Right thyroid nodule   . SUI (stress urinary incontinence, female)   . Wears glasses     Past Surgical History:  Procedure Laterality Date  . CESAREAN SECTION  1997   Twins-1 twin del.by c/s, 1 del.vaginally  . COLONOSCOPY  04/04/2011   Internal hemorrhoids/Nl colonoscopy-SLIGHTLY TORTUOUS COLON  . DILATATION & CURETTAGE/HYSTEROSCOPY WITH MYOSURE N/A 08/24/2016   Procedure: DILATATION & CURETTAGE/HYSTEROSCOPY WITH MYOSURE Possible Myosure ;  Surgeon: Nunzio Cobbs, MD;  Location: [facility];  Service: Gynecology;  Laterality: N/A;  . DILATION AND CURETTAGE OF UTERUS  1998   Seconday to miscarriage  . ESOPHAGOGASTRODUODENOSCOPY  09/22/2011   Mild gastritis/Hiatal hernia/DYSPHAGIA DUE TO PEPTIC STRICTURE/UNCONTROLLED GERD  . KNEE ARTHROSCOPY Right 2003  . MALONEY DILATION  09/22/2011   Procedure: MALONEY DILATION;  Surgeon: Danie Binder, MD;  Location: [facility];  Service: Endoscopy;  Laterality: N/A;  . SAVORY DILATION  09/22/2011   Procedure: SAVORY DILATION;  Surgeon: Danie Binder, MD;  Location: [facility];  Service: Endoscopy;  Laterality: N/A;    Current Outpatient Prescriptions  Medication Sig Dispense Refill  . calcium carbonate (TUMS - DOSED IN MG ELEMENTAL CALCIUM) 500 MG chewable tablet Chew 2 tablets by mouth 2 (two) times daily as needed for indigestion  or heartburn.     . Ginkgo Biloba 30 MG CAPS Take 30 mg by mouth daily.    Marland Kitchen ibuprofen (ADVIL,MOTRIN) 800 MG tablet Take 1 tablet (800 mg total) by mouth every 8 (eight) hours as needed. 30 tablet 0  . levothyroxine (SYNTHROID, LEVOTHROID) 125 MCG tablet Take 1 tablet (125 mcg total) by mouth daily. 30 tablet 12  . Multiple Vitamin (MULTIVITAMIN) tablet Take 1 tablet by mouth at bedtime.     . Omega-3 Fatty Acids (OMEGA-3 FISH OIL CONCENTRATE) 1000 MG CPDR Take 1,000 mg by mouth daily.      No current facility-administered medications for this visit.      ALLERGIES: Lactose intolerance (gi)  Family History  Problem Relation Age of Onset  . Hypertension Mother   . Angina Mother        MVP. h/o intestinal polyps possibly  . Hyperlipidemia Mother   . Transient ischemic attack Mother   . Hypertension Father   . Hyperlipidemia Father        glucose intolerant  . Diabetes Father   . GER disease Brother   . Diabetes Brother   . Diabetes Maternal Grandfather   . Hyperlipidemia Paternal Grandmother   . Thyroid disease Paternal Grandmother   . Hyperlipidemia Paternal Grandfather   . Thyroid disease Brother        hyperthyroid  . Colon cancer Neg Hx   . Breast cancer Neg Hx   . Ovarian cancer Neg Hx   . Anesthesia problems Neg Hx     Social History   Social History  . Marital status: Married    Spouse name: N/A  . Number of children: N/A  . Years of education: N/A   Occupational History  . Full time: [employer]    Social History Main Topics  . Smoking status: Never Smoker  . Smokeless tobacco: Never Used  . Alcohol use No  . Drug use: No  . Sexual activity: Yes    Partners: Male    Birth control/ protection: Other-see comments     Comment: husband with vasectomy   Other Topics Concern  . Not on file   Social History Narrative   Married to [name], neurology   Regular exercise: No    ROS:  Pertinent items are noted in HPI.  PHYSICAL EXAMINATION:    BP 110/60 (BP Location: Right Arm, Patient Position: Sitting, Cuff Size: Normal)   Pulse 92   Resp 16   Wt 185 lb (83.9 kg)   BMI 29.86 kg/m     General appearance: alert, cooperative and appears stated age    Pelvic: External genitalia:  no lesions              Urethra:  normal appearing urethra with no masses, tenderness or lesions              Bartholins and Skenes: normal                 Vagina: normal appearing vagina with normal color and discharge, no lesions              Cervix:   Flush with vaginal cuff.  More palpable than visible.                Bimanual Exam:  Uterus:  normal size, contour, position, consistency, mobility, non-tender              Adnexa: no mass, fullness, tenderness  Chaperone was present for exam.  ASSESSMENT  Status post hysteroscopic polypectomy with Myosure, dilation and curettage for benign endometrial polyp. Doing well.   PLAN  Reviewed surgical findings, procedure, and pathology report.  Photos from surgery shown to patient.  Follow up for routine well woman care in one year and prn.  Call for any vaginal bleeding.   An After Visit Summary was printed and given to the patient.

## 2016-09-29 NOTE — Addendum Note (Signed)
Addendum  created 09/29/16 1226 by Lyn Hollingshead, MD   Sign clinical note

## 2016-10-03 ENCOUNTER — Encounter: Payer: Self-pay | Admitting: "Endocrinology

## 2016-10-03 ENCOUNTER — Ambulatory Visit (INDEPENDENT_AMBULATORY_CARE_PROVIDER_SITE_OTHER): Payer: 59 | Admitting: "Endocrinology

## 2016-10-03 VITALS — BP 117/73 | HR 76 | Ht 66.0 in | Wt 186.0 lb

## 2016-10-03 DIAGNOSIS — R7303 Prediabetes: Secondary | ICD-10-CM

## 2016-10-03 DIAGNOSIS — E042 Nontoxic multinodular goiter: Secondary | ICD-10-CM | POA: Diagnosis not present

## 2016-10-03 DIAGNOSIS — E039 Hypothyroidism, unspecified: Secondary | ICD-10-CM | POA: Diagnosis not present

## 2016-10-03 MED ORDER — LEVOTHYROXINE SODIUM 137 MCG PO TABS: 137.0000 ug | ORAL_TABLET | Freq: Every day | ORAL | 6 refills | Status: DC

## 2016-10-03 NOTE — Progress Notes (Signed)
Subjective:    Patient ID: Stacy [name], female    DOB: May 09, 1960,  PCP Fayrene Helper, MD   Past Medical History:  Diagnosis Date  . Abnormal uterine bleeding   . Chronic leukopenia 08/17/2016   WBC = 3.5  . DDD (degenerative disc disease), cervical   . Dyslipidemia   . Elevated serum creatinine 08/17/2016  . GERD (gastroesophageal reflux disease)   . History of esophageal dilatation    2013  . History of ovarian cyst   . Hypothyroidism   . Lactose intolerance   . OSA on CPAP    study done 04-08-2013  . Pre-diabetes   . Right thyroid nodule   . SUI (stress urinary incontinence, female)   . Wears glasses    Past Surgical History:  Procedure Laterality Date  . CESAREAN SECTION  1997   Twins-1 twin del.by c/s, 1 del.vaginally  . COLONOSCOPY  04/04/2011   Internal hemorrhoids/Nl colonoscopy-SLIGHTLY TORTUOUS COLON  . DILATATION & CURETTAGE/HYSTEROSCOPY WITH MYOSURE N/A 08/24/2016   Procedure: DILATATION & CURETTAGE/HYSTEROSCOPY WITH MYOSURE Possible Myosure ;  Surgeon: Nunzio Cobbs, MD;  Location: [facility];  Service: Gynecology;  Laterality: N/A;  . DILATION AND CURETTAGE OF UTERUS  1998   Seconday to miscarriage  . ESOPHAGOGASTRODUODENOSCOPY  09/22/2011   Mild gastritis/Hiatal hernia/DYSPHAGIA DUE TO PEPTIC STRICTURE/UNCONTROLLED GERD  . KNEE ARTHROSCOPY Right 2003  . MALONEY DILATION  09/22/2011   Procedure: MALONEY DILATION;  Surgeon: Danie Binder, MD;  Location: [facility];  Service: Endoscopy;  Laterality: N/A;  . SAVORY DILATION  09/22/2011   Procedure: SAVORY DILATION;  Surgeon: Danie Binder, MD;  Location: [facility];  Service: Endoscopy;  Laterality: N/A;   Social History   Social History  . Marital status: Married    Spouse name: N/A  . Number of children: N/A  . Years of education: N/A   Occupational History  . Full time: [employer]    Social History Main Topics  . Smoking status: Never Smoker  . Smokeless tobacco: Never  Used  . Alcohol use No  . Drug use: No  . Sexual activity: Yes    Partners: Male    Birth control/ protection: Other-see comments     Comment: husband with vasectomy   Other Topics Concern  . Not on file   Social History Narrative   Married to [name], neurology   Regular exercise: No   Outpatient Encounter Prescriptions as of 10/03/2016  Medication Sig  . calcium carbonate (TUMS - DOSED IN MG ELEMENTAL CALCIUM) 500 MG chewable tablet Chew 2 tablets by mouth 2 (two) times daily as needed for indigestion or heartburn.   . Ginkgo Biloba 30 MG CAPS Take 30 mg by mouth daily.  Marland Kitchen ibuprofen (ADVIL,MOTRIN) 800 MG tablet Take 1 tablet (800 mg total) by mouth every 8 (eight) hours as needed.  Marland Kitchen levothyroxine (SYNTHROID, LEVOTHROID) 137 MCG tablet Take 1 tablet (137 mcg total) by mouth daily.  . Multiple Vitamin (MULTIVITAMIN) tablet Take 1 tablet by mouth at bedtime.   . Omega-3 Fatty Acids (OMEGA-3 FISH OIL CONCENTRATE) 1000 MG CPDR Take 1,000 mg by mouth daily.  . [DISCONTINUED] levothyroxine (SYNTHROID, LEVOTHROID) 125 MCG tablet Take 1 tablet (125 mcg total) by mouth daily.   No facility-administered encounter medications on file as of 10/03/2016.    ALLERGIES: Allergies  Allergen Reactions  . Lactose Intolerance (Gi)     Vomiting, diarrhea, bloating   VACCINATION STATUS: Immunization History  Administered Date(s) Administered  . Influenza Split 02/10/2014  . Influenza,inj,Quad PF,36+ Mos 01/30/2013, 01/12/2015  . Tdap 05/09/2012    HPI  56 yr old female with hypothyroidism from Hashimoto's thyroiditis. She has been consistent with her levothyroxine currently at 125 mcg by mouth every morning. - She admits to some inconsistency taking this medication. She has gained approximately 8 pounds since last visit .  she denies any family hx of thyroid dysfunction. She has a stable  1.2 cm right thyroid lobe nodule growing slightly from 9 mm  in 3 years . she denies heat/cold  intolerance. she has 4 children, with hx of HEELP syndrome during the first pregnancy. -She has stopped metformin due to intolerance. Her A1c last  has improved 5.8% from 6.1%.  Review of Systems  Constitutional:  + weight lgain, no fatigue, no subjective hyperthermia/hypothermia Eyes: no blurry vision, no xerophthalmia ENT: no sore throat, no nodules palpated in throat, no dysphagia/odynophagia, no hoarseness Cardiovascular: no CP/SOB/palpitations/leg swelling Respiratory: no cough/SOB Gastrointestinal: no N/V/D/C Musculoskeletal: no muscle/joint aches Skin: no rashes Neurological: no tremors/numbness/tingling/dizziness Psychiatric: no depression/anxiety  Objective:    BP 117/73   Pulse 76   Ht 5\' 6"  (1.676 m)   Wt 186 lb (84.4 kg)   BMI 30.02 kg/m   Wt Readings from Last 3 Encounters:  10/03/16 186 lb (84.4 kg)  09/14/16 185 lb (83.9 kg)  08/15/16 184 lb (83.5 kg)    Physical Exam  Constitutional: Steady state of mind,  in NAD Eyes: PERRLA, EOMI, no exophthalmos ENT: moist mucous membranes, +Palpable thyromegaly, no cervical lymphadenopathy Cardiovascular: RRR, No MRG Musculoskeletal: no deformities, strength intact in all 4 Skin: moist, warm, no rashes Neurological: no tremor with outstretched hands, DTR normal in all 4  Results for orders placed or performed during the hospital encounter of 08/24/16  TSH  Result Value Ref Range   TSH 2.738 0.350 - 4.500 uIU/mL  T4, free  Result Value Ref Range   Free T4 1.08 0.61 - 1.12 ng/dL  Hemoglobin A1c  Result Value Ref Range   Hgb A1c MFr Bld 5.8 (H) 4.8 - 5.6 %   Mean Plasma Glucose 120 mg/dL  CBC  Result Value Ref Range   WBC 3.5 (L) 4.0 - 10.5 K/uL   RBC 4.14 3.87 - 5.11 MIL/uL   Hemoglobin 12.4 12.0 - 15.0 g/dL   HCT 36.7 36.0 - 46.0 %   MCV 88.6 78.0 - 100.0 fL   MCH 30.0 26.0 - 34.0 pg   MCHC 33.8 30.0 - 36.0 g/dL   RDW 12.5 11.5 - 15.5 %   Platelets 156 150 - 400 K/uL  Basic metabolic panel  Result Value  Ref Range   Sodium 142 135 - 145 mmol/L   Potassium 4.1 3.5 - 5.1 mmol/L   Chloride 105 101 - 111 mmol/L   CO2 30 22 - 32 mmol/L   Glucose, Bld 92 65 - 99 mg/dL   BUN 12 6 - 20 mg/dL   Creatinine, Ser 1.03 (H) 0.44 - 1.00 mg/dL   Calcium 9.1 8.9 - 10.3 mg/dL   GFR calc non Af Amer 60 (L) >60 mL/min   GFR calc Af Amer >60 >60 mL/min   Anion gap 7 5 - 15  Pregnancy, urine  Result Value Ref Range   Preg Test, Ur NEGATIVE NEGATIVE   Chemistry (most recent): Lab Results  Component Value Date   NA 142 08/17/2016   K 4.1 08/17/2016   CL 105 08/17/2016  CO2 30 08/17/2016   BUN 12 08/17/2016   CREATININE 1.03 (H) 08/17/2016   Diabetic Labs (most recent): Lab Results  Component Value Date   HGBA1C 5.8 (H) 08/17/2016   HGBA1C 5.7 (H) 03/09/2015   HGBA1C 6.0 (H) 08/11/2014   Lipid Panel     Component Value Date/Time   CHOL 134 03/09/2015 1509   TRIG 93 03/09/2015 1509   HDL 46 03/09/2015 1509   CHOLHDL 2.9 03/09/2015 1509   VLDL 19 03/09/2015 1509   LDLCALC 69 03/09/2015 1509       Assessment & Plan:   1) hypothyroidism from Hashimoto's thyroiditis:   - Based on her thyroid function tests, she would benefit from slight increase in her levothyroxine. I discussed and prescribed levothyroxine 137 g by mouth every morning.    - We discussed about correct intake of levothyroxine, at fasting, with water, separated by at least 30 minutes from breakfast, and separated by more than 4 hours from calcium, iron, multivitamins, acid reflux medications (PPIs). -Patient is made aware of the fact that thyroid hormone replacement is needed for life, dose to be adjusted by periodic monitoring of thyroid function tests.  2) multinodular goiter  -Her thyroid ultrasound shows a right sided nodule grew from 9 mm to 12 mm over 3 year period of time. This is consistent with features of benign thyroid nodules.  He would not need antithyroid intervention at this point. No change on physical  exam today, she will be examined physically in 1 year and repeat thyroid ultrasound as necessary.   3) prediabetes:  -Her last A1c has improved 5.8%, she has gained 20 pounds over last year. She has stopped metformin due to some intolerance. She will have repeat A1c before her next visit. I advised patient to maintain close follow up with their PCP for primary care needs. Follow up plan: Return in about 6 months (around 04/04/2017) for follow up with pre-visit labs.  Glade Lloyd, MD Phone: [phone]  Fax: [phone]   10/03/2016, 4:06 PM

## 2017-01-19 DIAGNOSIS — G4733 Obstructive sleep apnea (adult) (pediatric): Secondary | ICD-10-CM | POA: Diagnosis not present

## 2017-01-30 ENCOUNTER — Ambulatory Visit (INDEPENDENT_AMBULATORY_CARE_PROVIDER_SITE_OTHER): Payer: 59 | Admitting: Family Medicine

## 2017-01-30 ENCOUNTER — Encounter: Payer: Self-pay | Admitting: Family Medicine

## 2017-01-30 VITALS — BP 120/76 | HR 82 | Temp 98.5°F | Resp 16 | Ht 66.0 in | Wt 187.5 lb

## 2017-01-30 DIAGNOSIS — Z23 Encounter for immunization: Secondary | ICD-10-CM | POA: Diagnosis not present

## 2017-01-30 DIAGNOSIS — E038 Other specified hypothyroidism: Secondary | ICD-10-CM

## 2017-01-30 DIAGNOSIS — J358 Other chronic diseases of tonsils and adenoids: Secondary | ICD-10-CM

## 2017-01-30 DIAGNOSIS — E785 Hyperlipidemia, unspecified: Secondary | ICD-10-CM

## 2017-01-30 DIAGNOSIS — J370 Chronic laryngitis: Secondary | ICD-10-CM

## 2017-01-30 DIAGNOSIS — R490 Dysphonia: Secondary | ICD-10-CM | POA: Diagnosis not present

## 2017-01-30 DIAGNOSIS — K219 Gastro-esophageal reflux disease without esophagitis: Secondary | ICD-10-CM | POA: Diagnosis not present

## 2017-01-30 DIAGNOSIS — E663 Overweight: Secondary | ICD-10-CM

## 2017-01-30 MED ORDER — AZITHROMYCIN 250 MG PO TABS: ORAL_TABLET | ORAL | 0 refills | Status: DC

## 2017-01-30 MED ORDER — FLUCONAZOLE 150 MG PO TABS: 150.0000 mg | ORAL_TABLET | Freq: Once | ORAL | 0 refills | Status: AC

## 2017-01-30 NOTE — Patient Instructions (Addendum)
F/u in 6 month, call if you need me before  Fasting lipid, vit D same day as when you get labs for Dr Dorris Fetch  Z pack o prescribed for sore throat   You are referred to Dr Oneida Alar and to ENT, their offices will call  It is important that you exercise regularly at least 30 minutes 6 times a week. If you develop chest pain, have severe difficulty breathing, or feel very tired, stop exercising immediately and seek medical attention   Weight  loss goal of 10 pounds  Increase Nexium to 20 mg TWO twice daily for uncontrolled reflux until you see GI  Please work on good  health habits so that your health will improve. 1. Commitment to daily physical activity for 30 to 60  minutes, if you are able to do this.  2. Commitment to wise food choices. Aim for half of your  food intake to be vegetable and fruit, one quarter starchy foods, and one quarter protein. Try to eat on a regular schedule  3 meals per day, snacking between meals should be limited to vegetables or fruits or small portions of nuts. 64 ounces of water per day is generally recommended, unless you have specific health conditions, like heart failure or kidney failure where you will need to limit fluid intake.  3. Commitment to sufficient and a  good quality of physical and mental rest daily, generally between 6 to 8 hours per day.  WITH PERSISTANCE AND PERSEVERANCE, THE IMPOSSIBLE , BECOMES THE NORM!

## 2017-01-30 NOTE — Progress Notes (Signed)
Stacy [name]     MRN: [id number]      DOB: Oct 17, 1960   HPI Stacy [name] is here with c/o painful white pocket in right posterior pharynx, assessed as tonsillitis x 3 days , felt ill , had swollen lymph nodes, no NKDA Reflux uncontrolled and not responding to nexium  OTC one daily and 4 to 6 Tums daily, has constant symptoms of heartburn, belching, gravely voice, unable to sing as she used to,  No dysphagia  Noted Central abdominal pain and belching noted, has noted  that 2 hrs after she ate  Last week  Notes excess weight gain over past several years, now uses her cPAP machine, feels better, but no longer committed to exercise, will change this Recently had succesful gyne procedure and keeps up with endo re her thyroid disease  ROS Denies recent fever or chills. Denies sinus pressure, nasal congestion, ear pain or sore throat. Denies chest congestion, productive cough or wheezing. Denies chest pains, palpitations and leg swelling  Denies dysuria, frequency, hesitancy or incontinence. Denies joint pain, swelling and limitation in mobility. Denies headaches, seizures, numbness, or tingling. Denies depression, anxiety or insomnia. Denies skin break down or rash.   PE  BP 120/76 (BP Location: Left Arm, Patient Position: Sitting, Cuff Size: Normal)   Pulse 82   Temp 98.5 F (36.9 C) (Other (Comment))   Resp 16   Ht 5\' 6"  (1.676 m)   Wt 187 lb 8 oz (85 kg)   SpO2 99%   BMI 30.26 kg/m   Patient alert and oriented and in no cardiopulmonary distress.  HEENT: No facial asymmetry, EOMI,   oropharynx pink and moist.  Neck supple no JVD, no mass.no visble intraoral lesion. Tender right cervical adenopathy, tM clear, no sinus tenderness, EOMI  Chest: Clear to auscultation bilaterally.  CVS: S1, S2 no murmurs, no S3.Regular rate.  ABD: Soft , mild epigastric tenderness, no RUQ tenderness, no palpable organomegaly or mass, normal BS  Ext: No edema  MS: Adequate ROM spine,  shoulders, hips and knees.  Skin: Intact, no ulcerations or rash noted.  Psych: Good eye contact, normal affect. Memory intact not anxious or depressed appearing.  CNS: CN 2-12 intact, power,  normal throughout.no focal deficits noted.   Assessment & Plan  GERD Uncontrolled symptoms, chronic dyspepsia, belching, significant epigastric pain x 1 week after eating, no response to daily Tums and Nexium Advised to increase Nexium to 20 mg two twice daily and is referred ot GI. Symptoms and signs do not suggest gall bladder disease  Laryngitis, chronic Pt with uncontrolled GERD symptoms who c/o gravely voice and inability to sing as in the past ENT to evaluate and treat soonest available appt, [place] preferred since anticipate sooner appt than [place] office  Hypothyroidism managed by endo and controlled  Overweight (BMI 25.0-29.9) Deteriorated. Patient re-educated about  the importance of commitment to a  minimum of 150 minutes of exercise per week.  The importance of healthy food choices with portion control discussed. Encouraged to start a food diary, count calories and to consider  joining a support group. Sample diet sheets offered. Goals set by the patient for the next several months.   Weight /BMI 01/30/2017 10/03/2016 09/14/2016  WEIGHT 187 lb 8 oz 186 lb 185 lb  HEIGHT 5\' 6"  5\' 6"  -  BMI 30.26 kg/m2 30.02 kg/m2 29.86 kg/m2      Tonsillar exudate Reported by pt along with symptoms of acute febrile illness , Z pack prescribed

## 2017-02-03 DIAGNOSIS — J370 Chronic laryngitis: Secondary | ICD-10-CM | POA: Insufficient documentation

## 2017-02-03 NOTE — Assessment & Plan Note (Signed)
managed by endo and controlled

## 2017-02-03 NOTE — Assessment & Plan Note (Signed)
Pt with uncontrolled GERD symptoms who c/o gravely voice and inability to sing as in the past ENT to evaluate and treat soonest available appt, [place] preferred since anticipate sooner appt than [place] office

## 2017-02-03 NOTE — Assessment & Plan Note (Signed)
Reported by pt along with symptoms of acute febrile illness , Z pack prescribed

## 2017-02-03 NOTE — Assessment & Plan Note (Signed)
Uncontrolled symptoms, chronic dyspepsia, belching, significant epigastric pain x 1 week after eating, no response to daily Tums and Nexium Advised to increase Nexium to 20 mg two twice daily and is referred ot GI. Symptoms and signs do not suggest gall bladder disease

## 2017-02-03 NOTE — Assessment & Plan Note (Signed)
Deteriorated. Patient re-educated about  the importance of commitment to a  minimum of 150 minutes of exercise per week.  The importance of healthy food choices with portion control discussed. Encouraged to start a food diary, count calories and to consider  joining a support group. Sample diet sheets offered. Goals set by the patient for the next several months.   Weight /BMI 01/30/2017 10/03/2016 09/14/2016  WEIGHT 187 lb 8 oz 186 lb 185 lb  HEIGHT 5\' 6"  5\' 6"  -  BMI 30.26 kg/m2 30.02 kg/m2 29.86 kg/m2

## 2017-02-05 ENCOUNTER — Encounter: Payer: Self-pay | Admitting: Gastroenterology

## 2017-02-07 ENCOUNTER — Ambulatory Visit (INDEPENDENT_AMBULATORY_CARE_PROVIDER_SITE_OTHER): Payer: 59 | Admitting: Gastroenterology

## 2017-02-07 ENCOUNTER — Encounter: Payer: Self-pay | Admitting: Gastroenterology

## 2017-02-07 DIAGNOSIS — E663 Overweight: Secondary | ICD-10-CM | POA: Diagnosis not present

## 2017-02-07 DIAGNOSIS — K219 Gastro-esophageal reflux disease without esophagitis: Secondary | ICD-10-CM | POA: Diagnosis not present

## 2017-02-07 MED ORDER — DEXLANSOPRAZOLE 60 MG PO CPDR: 60.0000 mg | DELAYED_RELEASE_CAPSULE | Freq: Every day | ORAL | 3 refills | Status: DC

## 2017-02-07 MED ORDER — DIETHYLPROPION HCL 25 MG PO TABS: ORAL_TABLET | ORAL | 0 refills | Status: DC

## 2017-02-07 NOTE — Patient Instructions (Addendum)
DRINK WATER TO KEEP YOUR URINE LIGHT YELLOW.  AVOID REFLUX TRIGGERS. SEE INFO BELOW.  TAKE DEXILANT WITH BREAKFAST.  TAKE TENUATE 30 MINS TO 1 HR PRIOR TO YOUR FIRST MEAL.  FOLLOW UP IN 4 MOS.   Tenuate side effects Get emergency medical help if you have any of these signs of an allergic reaction to Tenuate: hives; difficult breathing; swelling of your face, lips, tongue, or throat.  Stop using Tenuate and call your doctor at once if you have a serious side effect such as: fast, pounding, or uneven heartbeats; chest pain, feeling short of breath (even with mild exertion); feeling like you might pass out; swelling in your ankles or feet; confusion, hallucinations, unusual thoughts or behavior; seizure (convulsions); muscle movements you cannot control; or sudden numbness or weakness, especially on one side of the body. Less serious Tenuate side effects may include: nausea, vomiting, diarrhea, upset stomach; headache, blurred vision; feeling nervous, anxious, or jittery; sleep problems (insomnia); dizziness, drowsiness, tired feeling; depressed mood; dry mouth, unpleasant taste in your mouth; decreased sex drive; or mild itching or rash.    Lifestyle and home remedies TO MANAGE REFLUX/CHEST PAIN  You may eliminate or reduce the frequency of heartburn by making the following lifestyle changes:  . Control your weight. Being overweight is a major risk factor for heartburn and GERD. Excess pounds put pressure on your abdomen, pushing up your stomach and causing acid to back up into your esophagus.   . Eat smaller meals. 4 TO 6 MEALS A DAY. This reduces pressure on the lower esophageal sphincter, helping to prevent the valve from opening and acid from washing back into your esophagus.   Dolphus Jenny your belt. Clothes that fit tightly around your waist put pressure on your abdomen and the lower esophageal sphincter.   . Eliminate heartburn triggers. Everyone has specific triggers.  Common triggers such as fatty or fried foods, spicy food, tomato sauce, carbonated beverages, alcohol, chocolate, mint, garlic, onion, caffeine and nicotine may make heartburn worse.   Marland Kitchen Avoid stooping or bending. Tying your shoes is OK. Bending over for longer periods to weed your garden isn't, especially soon after eating.   . Don't lie down after a meal. Wait at least three to four hours after eating before going to bed, and don't lie down right after eating.   Marland Kitchen PUT THE HEAD OF YOUR BED ON 6 INCH BLOCKS.   Alternative medicine . Several home remedies exist for treating GERD, but they provide only temporary relief. They include drinking baking soda (sodium bicarbonate) added to water or drinking other fluids such as baking soda mixed with cream of tartar and water.  . Although these liquids create temporary relief by neutralizing, washing away or buffering acids, eventually they aggravate the situation by adding gas and fluid to your stomach, increasing pressure and causing more acid reflux. Further, adding more sodium to your diet may increase your blood pressure and add stress to your heart, and excessive bicarbonate ingestion can alter the acid-base balance in your body.

## 2017-02-07 NOTE — Progress Notes (Signed)
Subjective:    Patient ID: Stacy [name], female    DOB: 19-Jan-1961, 56 y.o.   MRN: [id number] Fayrene Helper, MD   HPI LOST WEIGHT AND SYMPTOMS GOT BETTER. LOST 20 LBS AND THEN GAINED TO BACK. HEARTBURN: BELCHING A LOTA ND WON'T STOP. [employer] OTC. WHAT WORKS: DEXILANT $60 A MONTH. BMS: GOOD WITH VEGETABLES. THROAT/VOICE HAS CHANGED. KIDS ARE 24, 21(2), 19. 2 [employer] AND 2 [employer] (SONS, TWINS-JRs). OCCASIONAL EPIGASTRIC PAIN. HEARTBURN: FEEL EVERY DAY MULTIPLE TIMES A DAY. SOME IMPROVEMENT BUT NOT COMPLETE RESOLUTION OF SYMPTOMS.  PT DENIES FEVER, CHILLS, HEMATOCHEZIA, HEMATEMESIS, nausea, vomiting, melena, diarrhea, CHEST PAIN, SHORTNESS OF BREATH, CHANGE IN BOWEL IN HABITS, constipation, problems swallowing, OR problems with sedation.  Past Medical History:  Diagnosis Date  . Abnormal uterine bleeding   . Chronic leukopenia 08/17/2016   WBC = 3.5  . DDD (degenerative disc disease), cervical   . Dyslipidemia   . Elevated serum creatinine 08/17/2016  . GERD (gastroesophageal reflux disease)   . History of esophageal dilatation    2013  . History of ovarian cyst   . Hypothyroidism   . Lactose intolerance   . OSA on CPAP    study done 04-08-2013  . Pre-diabetes   . Right thyroid nodule   . SUI (stress urinary incontinence, female)   . Wears glasses    Past Surgical History:  Procedure Laterality Date  . CESAREAN SECTION  1997   Twins-1 twin del.by c/s, 1 del.vaginally  . COLONOSCOPY  04/04/2011   Internal hemorrhoids/Nl colonoscopy-SLIGHTLY TORTUOUS COLON  . [place] N/A 08/24/2016     . DILATION AND CURETTAGE OF UTERUS  1998   Seconday to miscarriage  . ESOPHAGOGASTRODUODENOSCOPY  09/22/2011   Mild gastritis/Hiatal hernia/DYSPHAGIA DUE TO PEPTIC STRICTURE/UNCONTROLLED GERD  . KNEE ARTHROSCOPY Right 2003  . MALONEY DILATION  09/22/2011     . SAVORY DILATION  09/22/2011      Allergies  Allergen Reactions  . Lactose Intolerance  (Gi)     Vomiting, diarrhea, bloating   Current Outpatient Prescriptions  Medication Sig Dispense Refill  . calcium carbonate 500 MG chewable tablet Chew 2 tablets by mouth 2 (two) times daily as needed for indigestion or heartburn.     Marland Kitchen NEXIUM 20 MG capsule Take 40 mg by mouth daily at 12 noon.    . Ginkgo Biloba 30 MG CAPS Take 30 mg by mouth daily.    Marland Kitchen levothyroxine 37 MCG tablet Take 1 tablet (137 mcg total) by mouth daily.    . Multiple Vitamin tablet Take 1 tablet by mouth at bedtime.     . Omega-3 Fatty Acids 1000 MG CPDR Take 1,000 mg by mouth daily.     Review of Systems PER HPI OTHERWISE ALL SYSTEMS ARE NEGATIVE.    Objective:   Physical Exam  Constitutional: She is oriented to person, place, and time. She appears well-developed and well-nourished. No distress.  HENT:  Head: Normocephalic and atraumatic.  Mouth/Throat: Oropharynx is clear and moist. No oropharyngeal exudate.  Eyes: Pupils are equal, round, and reactive to light. No scleral icterus.  Neck: Normal range of motion. Neck supple.  Cardiovascular: Normal rate, regular rhythm and normal heart sounds.   Pulmonary/Chest: Effort normal and breath sounds normal. No respiratory distress.  Abdominal: Soft. Bowel sounds are normal. She exhibits no distension. There is no tenderness.  Musculoskeletal: She exhibits no edema.  Lymphadenopathy:    She has no cervical adenopathy.  Neurological: She is alert  and oriented to person, place, and time.  NO  NEW FOCAL DEFICITS  Psychiatric: She has a normal mood and affect.  Vitals reviewed.     Assessment & Plan:

## 2017-02-07 NOTE — Assessment & Plan Note (Addendum)
SYMPTOMS NOT CONTROLLED after weight gain AND USING OTC NEXIUM. ASSOCIATED WITH VOICE CHANGE.  DRINK WATER TO KEEP YOUR URINE LIGHT YELLOW. AVOID REFLUX TRIGGERS.  HANDOUT GIVEN. TAKE DEXILANT WITH BREAKFAST. CONTINUE YOUR WEIGHT LOSS EFFORTS. TAKE TENUATE 30 MINS TO 1 HR PRIOR TO YOUR FIRST MEAL. FOLLOW UP IN 4 MOS.

## 2017-02-07 NOTE — Assessment & Plan Note (Signed)
ASSOCIATED WITH UNCONTROLLED GERD.  DRINK WATER TO KEEP YOUR URINE LIGHT YELLOW. CONTINUE YOUR WEIGHT LOSS EFFORTS. TAKE TENUATE 30 MINS TO 1 HR TO YOUR FIRST MEAL. CALL WITH QUESTIONS OR CONCERNS. FOLLOW UP IN 4 MOS.

## 2017-03-14 ENCOUNTER — Ambulatory Visit: Payer: 59 | Admitting: Gastroenterology

## 2017-04-03 ENCOUNTER — Other Ambulatory Visit: Payer: Self-pay | Admitting: "Endocrinology

## 2017-04-03 DIAGNOSIS — R739 Hyperglycemia, unspecified: Secondary | ICD-10-CM

## 2017-04-03 DIAGNOSIS — E039 Hypothyroidism, unspecified: Secondary | ICD-10-CM

## 2017-04-04 LAB — VITAMIN D 25 HYDROXY (VIT D DEFICIENCY, FRACTURES): Vit D, 25-Hydroxy: 23 ng/mL — ABNORMAL LOW (ref 30–100)

## 2017-04-04 LAB — HEMOGLOBIN A1C
EAG (MMOL/L): 6.8 (calc)
Hgb A1c MFr Bld: 5.9 % of total Hgb — ABNORMAL HIGH (ref <5.7)
Mean Plasma Glucose: 123 (calc)

## 2017-04-04 LAB — LIPID PANEL
CHOLESTEROL: 209 mg/dL — AB (ref <200)
HDL: 49 mg/dL — AB (ref >50)
LDL Cholesterol (Calc): 131 mg/dL (calc) — ABNORMAL HIGH
Non-HDL Cholesterol (Calc): 160 mg/dL (calc) — ABNORMAL HIGH (ref <130)
Total CHOL/HDL Ratio: 4.3 (calc) (ref <5.0)
Triglycerides: 156 mg/dL — ABNORMAL HIGH (ref <150)

## 2017-04-04 LAB — TSH: TSH: 1.97 mIU/L (ref 0.40–4.50)

## 2017-04-04 LAB — T4, FREE: Free T4: 1.1 ng/dL (ref 0.8–1.8)

## 2017-04-05 ENCOUNTER — Ambulatory Visit (INDEPENDENT_AMBULATORY_CARE_PROVIDER_SITE_OTHER): Payer: 59 | Admitting: "Endocrinology

## 2017-04-05 ENCOUNTER — Encounter: Payer: Self-pay | Admitting: "Endocrinology

## 2017-04-05 VITALS — BP 128/68 | HR 67 | Ht 66.0 in | Wt 188.0 lb

## 2017-04-05 DIAGNOSIS — E039 Hypothyroidism, unspecified: Secondary | ICD-10-CM | POA: Diagnosis not present

## 2017-04-05 DIAGNOSIS — E042 Nontoxic multinodular goiter: Secondary | ICD-10-CM

## 2017-04-05 DIAGNOSIS — E559 Vitamin D deficiency, unspecified: Secondary | ICD-10-CM | POA: Diagnosis not present

## 2017-04-05 DIAGNOSIS — R7303 Prediabetes: Secondary | ICD-10-CM | POA: Diagnosis not present

## 2017-04-05 MED ORDER — LEVOTHYROXINE SODIUM 137 MCG PO TABS: 137.0000 ug | ORAL_TABLET | Freq: Every day | ORAL | 12 refills | Status: DC

## 2017-04-05 MED ORDER — VITAMIN D3 125 MCG (5000 UT) PO CAPS: 5000.0000 [IU] | ORAL_CAPSULE | Freq: Every day | ORAL | 0 refills | Status: DC

## 2017-04-05 NOTE — Progress Notes (Signed)
Subjective:    Patient ID: Stacy [name], female    DOB: [birth year],  PCP Fayrene Helper, MD   Past Medical History:  Diagnosis Date  . Abnormal uterine bleeding   . Chronic leukopenia 08/17/2016   WBC = 3.5  . DDD (degenerative disc disease), cervical   . Dyslipidemia   . Elevated serum creatinine 08/17/2016  . GERD (gastroesophageal reflux disease)   . History of esophageal dilatation    2013  . History of ovarian cyst   . Hypothyroidism   . Lactose intolerance   . OSA on CPAP    study done 04-08-2013  . Pre-diabetes   . Right thyroid nodule   . SUI (stress urinary incontinence, female)   . Wears glasses    Past Surgical History:  Procedure Laterality Date  . CESAREAN SECTION  1997   Twins-1 twin del.by c/s, 1 del.vaginally  . COLONOSCOPY  04/04/2011   Internal hemorrhoids/Nl colonoscopy-SLIGHTLY TORTUOUS COLON  . DILATATION & CURETTAGE/HYSTEROSCOPY WITH MYOSURE N/A 08/24/2016   Procedure: DILATATION & CURETTAGE/HYSTEROSCOPY WITH MYOSURE Possible Myosure ;  Surgeon: Nunzio Cobbs, MD;  Location: [facility];  Service: Gynecology;  Laterality: N/A;  . DILATION AND CURETTAGE OF UTERUS  1998   Seconday to miscarriage  . ESOPHAGOGASTRODUODENOSCOPY  09/22/2011   Mild gastritis/Hiatal hernia/DYSPHAGIA DUE TO PEPTIC STRICTURE/UNCONTROLLED GERD  . KNEE ARTHROSCOPY Right 2003  . MALONEY DILATION  09/22/2011   Procedure: MALONEY DILATION;  Surgeon: Danie Binder, MD;  Location: [facility];  Service: Endoscopy;  Laterality: N/A;  . SAVORY DILATION  09/22/2011   Procedure: SAVORY DILATION;  Surgeon: Danie Binder, MD;  Location: [facility];  Service: Endoscopy;  Laterality: N/A;   Social History   Socioeconomic History  . Marital status: Married    Spouse name: None  . Number of children: None  . Years of education: None  . Highest education level: None  Social Needs  . Financial resource strain: None  . Food insecurity - worry: None  . Food  insecurity - inability: None  . Transportation needs - medical: None  . Transportation needs - non-medical: None  Occupational History  . Occupation: Full time: [employer]  Tobacco Use  . Smoking status: Never Smoker  . Smokeless tobacco: Never Used  Substance and Sexual Activity  . Alcohol use: No  . Drug use: No  . Sexual activity: Yes    Partners: Male    Birth control/protection: Other-see comments    Comment: husband with vasectomy  Other Topics Concern  . None  Social History Narrative   Married to [name], neurology   Regular exercise: No   Outpatient Encounter Medications as of 04/05/2017  Medication Sig  . calcium carbonate (TUMS - DOSED IN MG ELEMENTAL CALCIUM) 500 MG chewable tablet Chew 2 tablets by mouth 2 (two) times daily as needed for indigestion or heartburn.   . Cholecalciferol (VITAMIN D3) 5000 units CAPS Take 1 capsule (5,000 Units total) by mouth daily.  Marland Kitchen dexlansoprazole (DEXILANT) 60 MG capsule Take 1 capsule (60 mg total) by mouth daily.  . Diethylpropion HCl 25 MG TABS 1 po 1 hour prior to meals TID  . esomeprazole (NEXIUM) 20 MG capsule Take 40 mg by mouth daily at 12 noon.  . Ginkgo Biloba 30 MG CAPS Take 30 mg by mouth daily.  Marland Kitchen levothyroxine (SYNTHROID, LEVOTHROID) 137 MCG tablet Take 1 tablet (137 mcg total) by mouth daily.  . Multiple Vitamin (MULTIVITAMIN) tablet Take  1 tablet by mouth at bedtime.   . Omega-3 Fatty Acids (OMEGA-3 FISH OIL CONCENTRATE) 1000 MG CPDR Take 1,000 mg by mouth daily.  . [DISCONTINUED] levothyroxine (SYNTHROID, LEVOTHROID) 137 MCG tablet Take 1 tablet (137 mcg total) by mouth daily.   No facility-administered encounter medications on file as of 04/05/2017.    ALLERGIES: Allergies  Allergen Reactions  . Lactose Intolerance (Gi)     Vomiting, diarrhea, bloating   VACCINATION STATUS: Immunization History  Administered Date(s) Administered  . Influenza Split 02/10/2014  . Influenza,inj,Quad PF,6+ Mos 01/30/2013,  01/12/2015, 01/30/2017  . Tdap 05/09/2012    HPI  56 yr old female with hypothyroidism from Hashimoto's thyroiditis. She has been consistent with her levothyroxine currently at 137 mcg by mouth every morning.  she denies any family hx of thyroid dysfunction. She has a stable  1.2 cm right thyroid lobe nodule growing slightly from 9 mm  in 3 years . she denies heat/cold intolerance. she has 4 children, with hx of HEELP syndrome during the first pregnancy. -She has stopped metformin due to intolerance. Her A1c last  has improved 5.8% from 6.1%. - She denies heat/cold intolerance. She has progressively gained up to 188 pounds at this time.  Review of Systems  Constitutional:  + weight lgain, no fatigue, no subjective hyperthermia/hypothermia Eyes: no blurry vision, no xerophthalmia ENT: no sore throat, no nodules palpated in throat, no dysphagia/odynophagia, no hoarseness Cardiovascular: no CP/SOB/palpitations/leg swelling Respiratory: no cough/SOB Gastrointestinal: no N/V/D/C Musculoskeletal: no muscle/joint aches Skin: no rashes Neurological: no tremors/numbness/tingling/dizziness Psychiatric: no depression/anxiety  Objective:    BP 128/68   Pulse 67   Ht 5\' 6"  (1.676 m)   Wt 188 lb (85.3 kg)   BMI 30.34 kg/m   Wt Readings from Last 3 Encounters:  04/05/17 188 lb (85.3 kg)  02/07/17 184 lb 9.6 oz (83.7 kg)  01/30/17 187 lb 8 oz (85 kg)    Physical Exam  Constitutional: Steady state of mind,  in NAD Eyes: PERRLA, EOMI, no exophthalmos ENT: moist mucous membranes, +Palpable thyromegaly, no cervical lymphadenopathy Cardiovascular: RRR, No MRG Musculoskeletal: no deformities, strength intact in all 4 Skin: moist, warm, no rashes Neurological: no tremor with outstretched hands, DTR normal in all 4  Results for orders placed or performed in visit on 04/03/17  T4, Free  Result Value Ref Range   Free T4 1.1 0.8 - 1.8 ng/dL  TSH  Result Value Ref Range   TSH 1.97 0.40 -  4.50 mIU/L  Hemoglobin A1c  Result Value Ref Range   Hgb A1c MFr Bld 5.9 (H) <5.7 % of total Hgb   Mean Plasma Glucose 123 (calc)   eAG (mmol/L) 6.8 (calc)   Chemistry (most recent): Lab Results  Component Value Date   NA 142 08/17/2016   K 4.1 08/17/2016   CL 105 08/17/2016   CO2 30 08/17/2016   BUN 12 08/17/2016   CREATININE 1.03 (H) 08/17/2016   Diabetic Labs (most recent): Lab Results  Component Value Date   HGBA1C 5.9 (H) 04/03/2017   HGBA1C 5.8 (H) 08/17/2016   HGBA1C 5.7 (H) 03/09/2015   Lipid Panel     Component Value Date/Time   CHOL 209 (H) 04/03/2017 1611   TRIG 156 (H) 04/03/2017 1611   HDL 49 (L) 04/03/2017 1611   CHOLHDL 4.3 04/03/2017 1611   VLDL 19 03/09/2015 1509   LDLCALC 69 03/09/2015 1509     Assessment & Plan:   1) hypothyroidism from Hashimoto's thyroiditis:   -  Her thyroid function test are consistent with appropriate replacement. I advised her to continue levothyroxine 137 g by mouth every morning.   - We discussed about correct intake of levothyroxine, at fasting, with water, separated by at least 30 minutes from breakfast, and separated by more than 4 hours from calcium, iron, multivitamins, acid reflux medications (PPIs). -Patient is made aware of the fact that thyroid hormone replacement is needed for life, dose to be adjusted by periodic monitoring of thyroid function tests.  2) multinodular goiter  -Her thyroid ultrasound shows a right sided nodule grew from 9 mm to 12 mm over 3 year period of time. This is consistent with features of benign thyroid nodules.  She will not need antithyroid intervention at this point. No change on physical exam today, she will be examined physically in 1 year and repeat thyroid ultrasound as necessary.   3) prediabetes:  -Her last A1c is able to 5.9%. She has stopped metformin due to some intolerance. She will have repeat A1c before her next visit. I advised patient to maintain close follow up with her  PCP for primary care needs. -  Suggestion is made for her to avoid simple carbohydrates  from her diet including Cakes, Sweet Desserts / Pastries, Ice Cream, Soda (diet and regular), Sweet Tea, Candies, Chips, Cookies, Store Bought Juices, Alcohol in Excess of  1-2 drinks a day, Artificial Sweeteners, and "Sugar-free" Products. This will help patient to have stable blood glucose profile and potentially avoid unintended weight gain.  - I advised her to be consistent with her omega-3 fatty acids 1000 mg total daily. Follow up plan: Return in about 1 year (around 04/05/2018) for follow up with pre-visit labs.  Glade Lloyd, MD Phone: [phone]  Fax: [phone]  -  This note was partially dictated with voice recognition software. Similar sounding words can be transcribed inadequately or may not  be corrected upon review.  04/05/2017, 12:51 PM

## 2017-04-06 ENCOUNTER — Encounter: Payer: Self-pay | Admitting: Family Medicine

## 2017-05-15 ENCOUNTER — Telehealth: Payer: Self-pay | Admitting: Family Medicine

## 2017-05-15 NOTE — Telephone Encounter (Signed)
Pls mail her pt lab result message to her, she never responded to me, tanks

## 2017-05-15 NOTE — Telephone Encounter (Signed)
Mailed

## 2017-06-26 DIAGNOSIS — Z0289 Encounter for other administrative examinations: Secondary | ICD-10-CM

## 2017-07-31 ENCOUNTER — Ambulatory Visit: Payer: 59 | Admitting: Family Medicine

## 2017-08-14 ENCOUNTER — Ambulatory Visit: Payer: 59 | Admitting: Family Medicine

## 2017-09-07 DIAGNOSIS — H1132 Conjunctival hemorrhage, left eye: Secondary | ICD-10-CM | POA: Diagnosis not present

## 2017-12-21 ENCOUNTER — Encounter (HOSPITAL_COMMUNITY): Payer: Self-pay

## 2017-12-21 ENCOUNTER — Ambulatory Visit (HOSPITAL_COMMUNITY)
Admission: EM | Admit: 2017-12-21 | Discharge: 2017-12-21 | Disposition: A | Discharge status: Home / Self Care | Payer: 59 | Attending: Internal Medicine | Admitting: Internal Medicine

## 2017-12-21 DIAGNOSIS — J039 Acute tonsillitis, unspecified: Secondary | ICD-10-CM | POA: Diagnosis not present

## 2017-12-21 MED ORDER — LIDOCAINE VISCOUS HCL 2 % MT SOLN: 15.0000 mL | OROMUCOSAL | 0 refills | Status: DC | PRN

## 2017-12-21 MED ORDER — AMOXICILLIN 500 MG PO CAPS: 500.0000 mg | ORAL_CAPSULE | Freq: Two times a day (BID) | ORAL | 0 refills | Status: AC

## 2017-12-21 MED ORDER — DEXLANSOPRAZOLE 60 MG PO CPDR: 60.0000 mg | DELAYED_RELEASE_CAPSULE | Freq: Every day | ORAL | 0 refills | Status: DC

## 2017-12-21 NOTE — Discharge Instructions (Addendum)
Given appearance of throat, treating for tonsillitis. Start amoxicillin as directed. Start lidocaine for sore throat, do not eat or drink for the next 40 mins after use as it can stunt your gag reflex. You can use Flonase,  Zyrtec for nasal congestion/drainage. You can use over the counter nasal saline rinse such as neti pot for nasal congestion. Monitor for any worsening of symptoms, swelling of the throat, trouble breathing, trouble swallowing, leaning forward to breath, drooling, go to the emergency department for further evaluation needed.  For sore throat/cough try using a honey-based tea. Use 3 teaspoons of honey with juice squeezed from half lemon. Place shaved pieces of ginger into 1/2-1 cup of water and warm over stove top. Then mix the ingredients and repeat every 4 hours as needed.  Dexilant refilled for 90 days.

## 2017-12-21 NOTE — ED Provider Notes (Signed)
[facility]    CSN: [id number] Arrival date & time: 12/21/17  4128     History   Chief Complaint Chief Complaint  Patient presents with  . Sore Throat    HPI Stacy [name] is a 57 y.o. female.   57 year old female comes in for 1 week history of sore throat. Does have mild rhinorrhea. Denies nasal congestion, cough. Headache intermittently that has resolved after ibuprofen. Denies fever, chills, night sweats. Does have increase acid reflux symptoms with belching without abdominal pain, nausea, vomiting. Has ran out of [place]. States has had esophagitis with GERD in the past, but usually resolves after 1 day, and usually improves with salt water gurgle, which it has not. Denies seasonal allergies. Positive sick contact. Never smoker. Had Mono as a teenager.      Past Medical History:  Diagnosis Date  . Abnormal uterine bleeding   . Chronic leukopenia 08/17/2016   WBC = 3.5  . DDD (degenerative disc disease), cervical   . Dyslipidemia   . Elevated serum creatinine 08/17/2016  . GERD (gastroesophageal reflux disease)   . History of esophageal dilatation    2013  . History of ovarian cyst   . Hypothyroidism   . Lactose intolerance   . OSA on CPAP    study done 04-08-2013  . Pre-diabetes   . Right thyroid nodule   . SUI (stress urinary incontinence, female)   . Wears glasses     Patient Active Problem List   Diagnosis Date Noted  . Vitamin D deficiency 04/05/2017  . Laryngitis, chronic 02/03/2017  . Primary hypothyroidism 03/11/2015  . 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 Chest pain 03/07/2015  . Stress and adjustment reaction 03/07/2015  . Polymenorrhea 08/13/2014  . Tonsillar exudate 08/13/2014  . Pain in joint, ankle and foot 08/13/2014  . Metabolic syndrome X 78/67/6720  . Sleep disorder 02/01/2013  . Prediabetes 01/30/2013  . OA (osteoarthritis) of knee 10/03/2012  . Cervical neck pain with evidence of disc disease 09/24/2012  .  Hypothyroidism 09/21/2011  . ABNORMAL ELECTROCARDIOGRAM 03/04/2010  . Anemia 09/15/2009  . LEUKOPENIA, CHRONIC 09/15/2009  . Overweight (BMI 25.0-29.9) 04/24/2009  . Dyslipidemia 05/23/2007  . GERD 05/23/2007    Past Surgical History:  Procedure Laterality Date  . CESAREAN SECTION  1997   Twins-1 twin del.by c/s, 1 del.vaginally  . COLONOSCOPY  04/04/2011   Internal hemorrhoids/Nl colonoscopy-SLIGHTLY TORTUOUS COLON  . DILATATION & CURETTAGE/HYSTEROSCOPY WITH MYOSURE N/A 08/24/2016   Procedure: DILATATION & CURETTAGE/HYSTEROSCOPY WITH MYOSURE Possible Myosure ;  Surgeon: Nunzio Cobbs, MD;  Location: [facility];  Service: Gynecology;  Laterality: N/A;  . DILATION AND CURETTAGE OF UTERUS  1998   Seconday to miscarriage  . ESOPHAGOGASTRODUODENOSCOPY  09/22/2011   Mild gastritis/Hiatal hernia/DYSPHAGIA DUE TO PEPTIC STRICTURE/UNCONTROLLED GERD  . KNEE ARTHROSCOPY Right 2003  . MALONEY DILATION  09/22/2011   Procedure: MALONEY DILATION;  Surgeon: Danie Binder, MD;  [facility];  Service: Endoscopy;  Laterality: N/A;  . SAVORY DILATION  09/22/2011   Procedure: SAVORY DILATION;  Surgeon: Danie Binder, MD;  [facility];  Service: Endoscopy;  Laterality: N/A;    OB History    Gravida  4   Para  3   Term  3   Preterm      AB  1   Living  4     SAB  1   TAB      Ectopic  Multiple  1   Live Births  4            Home Medications    Prior to Admission medications   Medication Sig Start Date End Date Taking? Authorizing Provider  calcium carbonate (TUMS - DOSED IN MG ELEMENTAL CALCIUM) 500 MG chewable tablet Chew 2 tablets by mouth 2 (two) times daily as needed for indigestion or heartburn.    Yes [provider]  Cholecalciferol (VITAMIN D3) 5000 units CAPS Take 1 capsule (5,000 Units total) by mouth daily. 04/05/17  Yes Nida, Marella Chimes, MD  Ginkgo Biloba 30 MG CAPS Take 30 mg by mouth daily.   Yes [provider]  levothyroxine (SYNTHROID, LEVOTHROID) 137 MCG tablet Take 1 tablet (137 mcg total) by mouth daily. 04/05/17  Yes Cassandria Anger, MD  Multiple Vitamin (MULTIVITAMIN) tablet Take 1 tablet by mouth at bedtime.    Yes [provider]  Omega-3 Fatty Acids (OMEGA-3 FISH OIL CONCENTRATE) 1000 MG CPDR Take 1,000 mg by mouth daily.   Yes [provider]  amoxicillin (AMOXIL) 500 MG capsule Take 1 capsule (500 mg total) by mouth 2 (two) times daily for 10 days. 12/21/17 12/31/17  Ok Edwards, PA-C  dexlansoprazole (DEXILANT) 60 MG capsule Take 1 capsule (60 mg total) by mouth daily. 12/21/17   Tasia Catchings, Nelline Lio V, PA-C  lidocaine (XYLOCAINE) 2 % solution Use as directed 15 mLs in the mouth or throat as needed for mouth pain. 12/21/17   Ok Edwards, PA-C    Family History Family History  Problem Relation Age of Onset  . Hypertension Mother   . Angina Mother        MVP. h/o intestinal polyps possibly  . Hyperlipidemia Mother   . Transient ischemic attack Mother   . Hypertension Father   . Hyperlipidemia Father        glucose intolerant  . Diabetes Father   . GER disease Brother   . Diabetes Brother   . Diabetes Maternal Grandfather   . Hyperlipidemia Paternal Grandmother   . Thyroid disease Paternal Grandmother   . Hyperlipidemia Paternal Grandfather   . Thyroid disease Brother        hyperthyroid  . Colon cancer Neg Hx   . Breast cancer Neg Hx   . Ovarian cancer Neg Hx   . Anesthesia problems Neg Hx     Social History Social History   Tobacco Use  . Smoking status: Never Smoker  . Smokeless tobacco: Never Used  Substance Use Topics  . Alcohol use: No  . Drug use: No     Allergies   Lactose intolerance (gi)   Review of Systems Review of Systems  Reason unable to perform ROS: See HPI as above.     Physical Exam Triage Vital Signs ED Triage Vitals  Enc Vitals Group     BP 12/21/17 0954 134/75     Pulse Rate 12/21/17 0954 63     Resp 12/21/17 0954  19     Temp 12/21/17 0954 98 F (36.7 C)     Temp src --      SpO2 12/21/17 0954 100 %     Weight --      Height --      Head Circumference --      Peak Flow --      Pain Score 12/21/17 0955 4     Pain Loc --      Pain Edu? --  Excl. in GC? --    No data found.  Updated Vital Signs BP 134/75   Pulse 63   Temp 98 F (36.7 C)   Resp 19   SpO2 100%   Physical Exam  Constitutional: Stacy [name] is oriented to person, place, and time. Stacy [name] appears well-developed and well-nourished. No distress.  HENT:  Head: Normocephalic and atraumatic.  Right Ear: Tympanic membrane, external ear and ear canal normal. Tympanic membrane is not erythematous and not bulging.  Left Ear: Tympanic membrane, external ear and ear canal normal. Tympanic membrane is not erythematous and not bulging.  Nose: Nose normal. Right sinus exhibits no maxillary sinus tenderness and no frontal sinus tenderness. Left sinus exhibits no maxillary sinus tenderness and no frontal sinus tenderness.  Mouth/Throat: Uvula is midline and mucous membranes are normal. Posterior oropharyngeal erythema present. Tonsils are 2+ on the right. Tonsils are 2+ on the left. Tonsillar exudate.  Eyes: Pupils are equal, round, and reactive to light. Conjunctivae are normal.  Neck: Normal range of motion. Neck supple.  Cardiovascular: Normal rate, regular rhythm and normal heart sounds. Exam reveals no gallop and no friction rub.  No murmur heard. Pulmonary/Chest: Effort normal and breath sounds normal. Stacy [name] has no decreased breath sounds. Stacy [name] has no wheezes. Stacy [name] has no rhonchi. Stacy [name] has no rales.  Lymphadenopathy:    Stacy [name] has no cervical adenopathy.  Neurological: Stacy [name] is alert and oriented to person, place, and time.  Skin: Skin is warm and dry.  Psychiatric: Stacy [name] has a normal mood and affect. Her behavior is normal. Judgment normal.     UC Treatments / Results  Labs (all labs ordered are listed, but only abnormal results are displayed) Labs  Reviewed - No data to display  EKG None  Radiology No results found.  Procedures Procedures (including critical care time)  Medications Ordered in UC Medications - No data to display  Initial Impression / Assessment and Plan / UC Course  I have reviewed the triage vital signs and the nursing notes.  Pertinent labs & imaging results that were available during my care of the patient were reviewed by me and considered in my medical decision making (see chart for details).    Given history and exam, would cover for tonsillitis. Start amoxicillin as directed. Other symptomatic treatment discussed. Return precautions given. Patient expresses understanding and agrees to plan.   Final Clinical Impressions(s) / UC Diagnoses   Final diagnoses:  Tonsillitis   ED Prescriptions    Medication Sig Dispense Auth. Provider   dexlansoprazole (DEXILANT) 60 MG capsule Take 1 capsule (60 mg total) by mouth daily. 90 capsule Tarance Balan V, PA-C   amoxicillin (AMOXIL) 500 MG capsule Take 1 capsule (500 mg total) by mouth 2 (two) times daily for 10 days. 20 capsule Jasmyn Picha V, PA-C   lidocaine (XYLOCAINE) 2 % solution Use as directed 15 mLs in the mouth or throat as needed for mouth pain. 100 mL Tobin Chad, [place] 12/21/17 1018

## 2017-12-21 NOTE — ED Triage Notes (Signed)
Pt presents with sore throat x 2 days.

## 2018-04-11 ENCOUNTER — Ambulatory Visit: Payer: 59 | Admitting: "Endocrinology

## 2018-04-12 ENCOUNTER — Other Ambulatory Visit: Payer: Self-pay | Admitting: "Endocrinology

## 2018-04-12 ENCOUNTER — Ambulatory Visit: Payer: 59 | Admitting: "Endocrinology

## 2018-04-12 DIAGNOSIS — R7303 Prediabetes: Secondary | ICD-10-CM

## 2018-04-12 DIAGNOSIS — E042 Nontoxic multinodular goiter: Secondary | ICD-10-CM

## 2018-04-12 DIAGNOSIS — E559 Vitamin D deficiency, unspecified: Secondary | ICD-10-CM

## 2018-04-22 ENCOUNTER — Other Ambulatory Visit: Payer: Self-pay | Admitting: "Endocrinology

## 2018-04-22 ENCOUNTER — Ambulatory Visit (INDEPENDENT_AMBULATORY_CARE_PROVIDER_SITE_OTHER): Payer: 59

## 2018-04-22 ENCOUNTER — Other Ambulatory Visit (HOSPITAL_COMMUNITY): Payer: Self-pay | Admitting: Family Medicine

## 2018-04-22 DIAGNOSIS — E042 Nontoxic multinodular goiter: Secondary | ICD-10-CM | POA: Diagnosis not present

## 2018-04-22 DIAGNOSIS — Z23 Encounter for immunization: Secondary | ICD-10-CM | POA: Diagnosis not present

## 2018-04-22 DIAGNOSIS — Z1231 Encounter for screening mammogram for malignant neoplasm of breast: Secondary | ICD-10-CM

## 2018-04-22 DIAGNOSIS — R7303 Prediabetes: Secondary | ICD-10-CM | POA: Diagnosis not present

## 2018-04-22 DIAGNOSIS — E559 Vitamin D deficiency, unspecified: Secondary | ICD-10-CM

## 2018-04-23 ENCOUNTER — Encounter: Payer: Self-pay | Admitting: "Endocrinology

## 2018-04-23 ENCOUNTER — Ambulatory Visit (INDEPENDENT_AMBULATORY_CARE_PROVIDER_SITE_OTHER): Payer: 59 | Admitting: "Endocrinology

## 2018-04-23 VITALS — BP 124/75 | HR 86 | Ht 66.0 in | Wt 187.0 lb

## 2018-04-23 DIAGNOSIS — E039 Hypothyroidism, unspecified: Secondary | ICD-10-CM

## 2018-04-23 DIAGNOSIS — R7303 Prediabetes: Secondary | ICD-10-CM | POA: Diagnosis not present

## 2018-04-23 DIAGNOSIS — E559 Vitamin D deficiency, unspecified: Secondary | ICD-10-CM | POA: Diagnosis not present

## 2018-04-23 LAB — TSH: TSH: 4.88 mIU/L — ABNORMAL HIGH (ref 0.40–4.50)

## 2018-04-23 LAB — HEMOGLOBIN A1C
HEMOGLOBIN A1C: 6 %{Hb} — AB (ref <5.7)
Mean Plasma Glucose: 126 (calc)
eAG (mmol/L): 7 (calc)

## 2018-04-23 LAB — T4, FREE: Free T4: 1 ng/dL (ref 0.8–1.8)

## 2018-04-23 LAB — VITAMIN D 25 HYDROXY (VIT D DEFICIENCY, FRACTURES): Vit D, 25-Hydroxy: 26 ng/mL — ABNORMAL LOW (ref 30–100)

## 2018-04-23 MED ORDER — VITAMIN D3 125 MCG (5000 UT) PO CAPS: 5000.0000 [IU] | ORAL_CAPSULE | Freq: Every day | ORAL | 0 refills | Status: DC

## 2018-04-23 MED ORDER — METFORMIN HCL ER 500 MG PO TB24: 500.0000 mg | ORAL_TABLET | Freq: Every day | ORAL | 6 refills | Status: DC

## 2018-04-23 MED ORDER — LEVOTHYROXINE SODIUM 137 MCG PO TABS: 137.0000 ug | ORAL_TABLET | Freq: Every day | ORAL | 6 refills | Status: DC

## 2018-04-23 NOTE — Progress Notes (Signed)
Endocrinology follow-up note  Subjective:    Patient ID: Stacy [name], female    DOB: 1960-07-27,  PCP Fayrene Helper, MD   Past Medical History:  Diagnosis Date  . Abnormal uterine bleeding   . Chronic leukopenia 08/17/2016   WBC = 3.5  . DDD (degenerative disc disease), cervical   . Dyslipidemia   . Elevated serum creatinine 08/17/2016  . GERD (gastroesophageal reflux disease)   . History of esophageal dilatation    2013  . History of ovarian cyst   . Hypothyroidism   . Lactose intolerance   . OSA on CPAP    study done 04-08-2013  . Pre-diabetes   . Right thyroid nodule   . SUI (stress urinary incontinence, female)   . Wears glasses    Past Surgical History:  Procedure Laterality Date  . CESAREAN SECTION  1997   Twins-1 twin del.by c/s, 1 del.vaginally  . COLONOSCOPY  04/04/2011   Internal hemorrhoids/Nl colonoscopy-SLIGHTLY TORTUOUS COLON  . DILATATION & CURETTAGE/HYSTEROSCOPY WITH MYOSURE N/A 08/24/2016   Procedure: DILATATION & CURETTAGE/HYSTEROSCOPY WITH MYOSURE Possible Myosure ;  Surgeon: Nunzio Cobbs, MD;  Location: [facility];  Service: Gynecology;  Laterality: N/A;  . DILATION AND CURETTAGE OF UTERUS  1998   Seconday to miscarriage  . ESOPHAGOGASTRODUODENOSCOPY  09/22/2011   Mild gastritis/Hiatal hernia/DYSPHAGIA DUE TO PEPTIC STRICTURE/UNCONTROLLED GERD  . KNEE ARTHROSCOPY Right 2003  . MALONEY DILATION  09/22/2011   Procedure: MALONEY DILATION;  Surgeon: Danie Binder, MD;  Location: [facility];  Service: Endoscopy;  Laterality: N/A;  . SAVORY DILATION  09/22/2011   Procedure: SAVORY DILATION;  Surgeon: Danie Binder, MD;  Location: [facility];  Service: Endoscopy;  Laterality: N/A;   Social History   Socioeconomic History  . Marital status: Married    Spouse name: Not on file  . Number of children: Not on file  . Years of education: Not on file  . Highest education level: Not on file  Occupational History  . Occupation: Full  time: Architectural technologist  Social Needs  . Financial resource strain: Not on file  . Food insecurity:    Worry: Not on file    Inability: Not on file  . Transportation needs:    Medical: Not on file    Non-medical: Not on file  Tobacco Use  . Smoking status: Never Smoker  . Smokeless tobacco: Never Used  Substance and Sexual Activity  . Alcohol use: No  . Drug use: No  . Sexual activity: Yes    Partners: Male    Birth control/protection: Other-see comments    Comment: husband with vasectomy  Lifestyle  . Physical activity:    Days per week: Not on file    Minutes per session: Not on file  . Stress: Not on file  Relationships  . Social connections:    Talks on phone: Not on file    Gets together: Not on file    Attends religious service: Not on file    Active member of club or organization: Not on file    Attends meetings of clubs or organizations: Not on file    Relationship status: Not on file  Other Topics Concern  . Not on file  Social History Narrative   Married to [name], neurology   Regular exercise: No   Outpatient Encounter Medications as of 04/23/2018  Medication Sig  . calcium carbonate (TUMS - DOSED IN MG ELEMENTAL CALCIUM) 500 MG chewable tablet Chew  2 tablets by mouth 2 (two) times daily as needed for indigestion or heartburn.   . Cholecalciferol (VITAMIN D3) 125 MCG (5000 UT) CAPS Take 1 capsule (5,000 Units total) by mouth daily.  Marland Kitchen dexlansoprazole (DEXILANT) 60 MG capsule Take 1 capsule (60 mg total) by mouth daily. (Patient not taking: Reported on 04/23/2018)  . Ginkgo Biloba 30 MG CAPS Take 30 mg by mouth daily.  Marland Kitchen levothyroxine (SYNTHROID, LEVOTHROID) 137 MCG tablet Take 1 tablet (137 mcg total) by mouth daily.  . metFORMIN (GLUCOPHAGE-XR) 500 MG 24 hr tablet Take 1 tablet (500 mg total) by mouth daily with breakfast.  . Multiple Vitamin (MULTIVITAMIN) tablet Take 1 tablet by mouth at bedtime.   . Omega-3 Fatty Acids (OMEGA-3 FISH OIL CONCENTRATE) 1000 MG  CPDR Take 1,000 mg by mouth daily.  . [DISCONTINUED] Cholecalciferol (VITAMIN D3) 5000 units CAPS Take 1 capsule (5,000 Units total) by mouth daily.  . [DISCONTINUED] levothyroxine (SYNTHROID, LEVOTHROID) 137 MCG tablet Take 1 tablet (137 mcg total) by mouth daily.  . [DISCONTINUED] lidocaine (XYLOCAINE) 2 % solution Use as directed 15 mLs in the mouth or throat as needed for mouth pain.   No facility-administered encounter medications on file as of 04/23/2018.    ALLERGIES: Allergies  Allergen Reactions  . Lactose Intolerance (Gi)     Vomiting, diarrhea, bloating   VACCINATION STATUS: Immunization History  Administered Date(s) Administered  . Influenza Split 02/10/2014  . Influenza,inj,Quad PF,6+ Mos 01/30/2013, 01/12/2015, 01/30/2017, 04/22/2018  . Tdap 05/09/2012    HPI  57 yr old female with hypothyroidism from Hashimoto's thyroiditis. She has been inconsistent with her levothyroxine or has been taking it with other products lately, currently at 137 mcg by mouth every morning.  she denies any family hx of thyroid dysfunction. She has a stable  1.2 cm right thyroid lobe nodule growing slightly from 9 mm  in 3 years . she denies heat/cold intolerance. she has 4 children, with hx of HEELP syndrome during the first pregnancy. -She was previously tried with regular metformin with intolerance.  She is not taking any metformin at this time A1c is back up to 6%.    - She denies heat/cold intolerance. She has progressively gained up to 188 pounds at this time.  Review of Systems  Constitutional:  + steady weight, no fatigue, no subjective hyperthermia/hypothermia Eyes: no blurry vision, no xerophthalmia ENT: no sore throat, no nodules palpated in throat, no dysphagia/odynophagia, no hoarseness Musculoskeletal: no muscle/joint aches Skin: no rashes Neurological: no tremors/numbness/tingling/dizziness Psychiatric: no depression/anxiety  Objective:    BP 124/75   Pulse 86   Ht 5'  6" (1.676 m)   Wt 187 lb (84.8 kg)   BMI 30.18 kg/m   Wt Readings from Last 3 Encounters:  04/23/18 187 lb (84.8 kg)  04/05/17 188 lb (85.3 kg)  02/07/17 184 lb 9.6 oz (83.7 kg)    Physical Exam  Constitutional: Steady state of mind, not in acute distress.  Eyes: PERRLA, EOMI, no exophthalmos ENT: moist mucous membranes, +Palpable thyromegaly, no cervical lymphadenopathy Cardiovascular: RRR, No MRG Musculoskeletal: no deformities, strength intact in all 4 Skin: moist, warm, no rashes Neurological: no tremor with outstretched hands, DTR normal in all 4  Results for orders placed or performed in visit on 04/22/18  TSH  Result Value Ref Range   TSH 4.88 (H) 0.40 - 4.50 mIU/L  T4, Free  Result Value Ref Range   Free T4 1.0 0.8 - 1.8 ng/dL  VITAMIN D 25 Hydroxy (Vit-D  Deficiency, Fractures)  Result Value Ref Range   Vit D, 25-Hydroxy 26 (L) 30 - 100 ng/mL  Hemoglobin A1c  Result Value Ref Range   Hgb A1c MFr Bld 6.0 (H) <5.7 % of total Hgb   Mean Plasma Glucose 126 (calc)   eAG (mmol/L) 7.0 (calc)   Chemistry (most recent): Lab Results  Component Value Date   NA 142 08/17/2016   K 4.1 08/17/2016   CL 105 08/17/2016   CO2 30 08/17/2016   BUN 12 08/17/2016   CREATININE 1.03 (H) 08/17/2016   Diabetic Labs (most recent): Lab Results  Component Value Date   HGBA1C 6.0 (H) 04/22/2018   HGBA1C 5.9 (H) 04/03/2017   HGBA1C 5.8 (H) 08/17/2016   Lipid Panel     Component Value Date/Time   CHOL 209 (H) 04/03/2017 1611   TRIG 156 (H) 04/03/2017 1611   HDL 49 (L) 04/03/2017 1611   CHOLHDL 4.3 04/03/2017 1611   VLDL 19 03/09/2015 1509   LDLCALC 131 (H) 04/03/2017 1611     Assessment & Plan:   1) hypothyroidism from Hashimoto's thyroiditis:   - Her thyroid function test are consistent with inappropriate replacement.  However this is due to the fact that she is taking her thyroid hormone with other products.  Based on her current body weight levothyroxine 137 mcg is  still thyroid dose.  She is advised to continue levothyroxine 137 g by mouth every morning.   - We discussed about correct intake of levothyroxine, at fasting, with water, separated by at least 30 minutes from breakfast, and separated by more than 4 hours from calcium, iron, multivitamins, acid reflux medications (PPIs). -Patient is made aware of the fact that thyroid hormone replacement is needed for life, dose to be adjusted by periodic monitoring of thyroid function tests. 2) multinodular goiter  -Her thyroid ultrasound shows a right sided nodule grew from 9 mm to 12 mm over 3 year period of time. This is consistent with features of benign thyroid nodules.  She will not need antithyroid intervention at this point. No change on physical exam today, she will be examined physically in 1 year and repeat thyroid ultrasound as necessary.   3) prediabetes:  -Her A1c is increasing to 6%.  She is approached for metformin 500 mg extended release once a day after breakfast.   -  Suggestion is made for her to avoid simple carbohydrates  from her diet including Cakes, Sweet Desserts / Pastries, Ice Cream, Soda (diet and regular), Sweet Tea, Candies, Chips, Cookies, Store Bought Juices, Alcohol in Excess of  1-2 drinks a day, Artificial Sweeteners, and "Sugar-free" Products. This will help patient to have stable blood glucose profile and potentially avoid unintended weight gain.  4) vitamin D deficiency -I discussed and reinitiated vitamin D 3 5000 units daily for the next 90 days.   - I advised her to be consistent with her omega-3 fatty acids 1000 mg total daily.  Follow up plan: Return in about 6 months (around 10/22/2018) for Follow up with Pre-visit Labs.  Glade Lloyd, MD Phone: [phone]  Fax: [phone]  -  This note was partially dictated with voice recognition software. Similar sounding words can be transcribed inadequately or may not  be corrected upon review.  04/23/2018, 4:58 PM

## 2018-04-23 NOTE — Patient Instructions (Signed)

## 2018-04-29 ENCOUNTER — Ambulatory Visit (HOSPITAL_COMMUNITY)
Admission: RE | Admit: 2018-04-29 | Discharge: 2018-04-29 | Disposition: A | Discharge status: Home / Self Care | Payer: 59 | Source: Ambulatory Visit | Attending: Family Medicine | Admitting: Family Medicine

## 2018-04-29 DIAGNOSIS — Z1231 Encounter for screening mammogram for malignant neoplasm of breast: Secondary | ICD-10-CM | POA: Diagnosis present

## 2018-05-15 ENCOUNTER — Ambulatory Visit: Payer: 59 | Admitting: Family Medicine

## 2018-05-21 ENCOUNTER — Ambulatory Visit: Payer: 59 | Admitting: Family Medicine

## 2018-05-21 ENCOUNTER — Encounter: Payer: Self-pay | Admitting: Family Medicine

## 2018-05-21 VITALS — BP 118/72 | HR 66 | Resp 12 | Ht 67.0 in | Wt 181.1 lb

## 2018-05-21 DIAGNOSIS — F329 Major depressive disorder, single episode, unspecified: Secondary | ICD-10-CM | POA: Diagnosis not present

## 2018-05-21 DIAGNOSIS — K219 Gastro-esophageal reflux disease without esophagitis: Secondary | ICD-10-CM | POA: Diagnosis not present

## 2018-05-21 DIAGNOSIS — R1013 Epigastric pain: Secondary | ICD-10-CM

## 2018-05-21 DIAGNOSIS — R142 Eructation: Secondary | ICD-10-CM

## 2018-05-21 DIAGNOSIS — Z23 Encounter for immunization: Secondary | ICD-10-CM | POA: Diagnosis not present

## 2018-05-21 DIAGNOSIS — M5090 Cervical disc disorder, unspecified, unspecified cervical region: Secondary | ICD-10-CM

## 2018-05-21 DIAGNOSIS — E663 Overweight: Secondary | ICD-10-CM

## 2018-05-21 MED ORDER — PANTOPRAZOLE SODIUM 20 MG PO TBEC: 20.0000 mg | DELAYED_RELEASE_TABLET | Freq: Every day | ORAL | 3 refills | Status: DC

## 2018-05-21 NOTE — Assessment & Plan Note (Signed)
Uncontrolled, start daily protonix, refer GI excess belching

## 2018-05-21 NOTE — Patient Instructions (Addendum)
F/U in 3 months with shingrix #2, call if you need me before    Fasting lipid, cmp and eGFR, HBA1C 1 week before next visit    Shingrix #1 today  You will be referred to physical therapy for neck and back pain  You are referred for [place] of gall bladder and to see Dr Oneida Alar re excess belching and uncontrolled reflux  PLEASE STOP Caffeine and also start medication prescribed, protonix for reflux  Please start using CPAP machine again   You are being referred to therapist as we discussed  It is important that you exercise regularly at least 30 minutes 5 times a week. If you develop chest pain, have severe difficulty breathing, or feel very tired, stop exercising immediately and seek medical attention  Thank you  for choosing [facility]. We consider it a privelige to serve you.  Delivering excellent health care in a caring and  compassionate way is our goal.  Partnering with you,  so that together we can achieve this goal is our strategy.

## 2018-05-24 ENCOUNTER — Telehealth: Payer: Self-pay

## 2018-05-24 DIAGNOSIS — R7303 Prediabetes: Secondary | ICD-10-CM

## 2018-05-24 DIAGNOSIS — E785 Hyperlipidemia, unspecified: Secondary | ICD-10-CM

## 2018-05-24 NOTE — Telephone Encounter (Signed)
Labs ordered for next visit.

## 2018-05-29 ENCOUNTER — Encounter: Payer: Self-pay | Admitting: Gastroenterology

## 2018-05-29 ENCOUNTER — Encounter: Payer: Self-pay | Admitting: Family Medicine

## 2018-05-31 DIAGNOSIS — H11431 Conjunctival hyperemia, right eye: Secondary | ICD-10-CM | POA: Diagnosis not present

## 2018-06-03 ENCOUNTER — Encounter: Payer: Self-pay | Admitting: Family Medicine

## 2018-06-03 DIAGNOSIS — R1013 Epigastric pain: Secondary | ICD-10-CM | POA: Insufficient documentation

## 2018-06-03 DIAGNOSIS — Z23 Encounter for immunization: Secondary | ICD-10-CM | POA: Insufficient documentation

## 2018-06-03 DIAGNOSIS — F329 Major depressive disorder, single episode, unspecified: Secondary | ICD-10-CM | POA: Insufficient documentation

## 2018-06-03 NOTE — Assessment & Plan Note (Signed)
After obtaining informed consent, the vaccine is  administered , with no adverse effect noted at the time of administration.  

## 2018-06-03 NOTE — Assessment & Plan Note (Signed)
C/o increased neck and upper back pain radiaiting to upeer extremity and disabling, requests PT which has helped in the past and she is referred

## 2018-06-03 NOTE — Assessment & Plan Note (Signed)
Excessive belching and bloating, needs [place] of gall bladder to eval for gallstones , also refer to GI

## 2018-06-03 NOTE — Progress Notes (Signed)
Stacy [name]     MRN: [id number]      DOB: 1961-02-18   HPI Stacy [name] is here for follow up and re-evaluation of chronic medical conditions, medication management and review of any available recent lab and radiology data.  Preventive health is updated, specifically  Cancer screening and Immunization.   Questions or concerns regarding consultations or procedures which the PT has had in the interim are  addressed. The PT denies any adverse reactions to current medications since the last visit.  C/o uncontrolled and continuous belching for months  , and also has symptoms of uncontrolled GERD, with heartburn and liquid going up into her mouth at times C/o increased neck and upper back pain,radiating to upper extremity with numbness, has benfited  In the past from PT and requests referral for same C/o increased strss, poor sleep, following sepARATION FROM HER SPOUSE OF MANY YEARS, not suicidal or homicidal , but interested in therapy  ROS Denies recent fever or chills. Denies sinus pressure, nasal congestion, ear pain or sore throat. Denies chest congestion, productive cough or wheezing. Denies chest pains, palpitations and leg swelling   Denies dysuria, frequency, hesitancy or incontinence. Denies joint pain, swelling and limitation in mobility. Denies headaches, seizures, numbness, or tingling.  Denies skin break down or rash.   PE  BP 118/72   Pulse 66   Resp 12   Ht 5\' 7"  (1.702 m)   Wt 181 lb 1.3 oz (82.1 kg)   SpO2 97% Comment: room air  BMI 28.36 kg/m   Patient alert and oriented and in no cardiopulmonary distress.Excessive belching noted during the visit  HEENT: No facial asymmetry, EOMI,   oropharynx pink and moist.  Neck supple no JVD, no mass.  Chest: Clear to auscultation bilaterally.  CVS: S1, S2 no murmurs, no S3.Regular rate.  ABD: Soft non tender. No organomegaly or mass  Ext: No edema  MS: Adequate ROM spine, shoulders, hips and knees.  Skin:  Intact, no ulcerations or rash noted.  Psych: Good eye contact, normal affect. Memory intact not anxious mildly depressed appearing.at times  CNS: CN 2-12 intact, power,  normal throughout.no focal deficits noted.   Assessment & Plan  GERD Uncontrolled, start daily protonix, refer GI excess belching  Cervical back pain with evidence of disc disease C/o increased neck and upper back pain radiaiting to upeer extremity and disabling, requests PT which has helped in the past and she is referred  Dyspepsia Excessive belching and bloating, needs [place] of gall bladder to eval for gallstones , also refer to GI  Depression, reactive Symptomatic following recent unexpected separation from spouse of over 20 years, not suicidal or homicidal. Refer to therapist  Overweight (BMI 25.0-29.9) Improved, she is applauded on this and encouraged to continue same  Patient re-educated about  the importance of commitment to a  minimum of 150 minutes of exercise per week as able.  The importance of healthy food choices with portion control discussed, as well as eating regularly and within a 12 hour window most days. The need to choose "clean , green" food 50 to 75% of the time is discussed, as well as to make water the primary drink and set a goal of 64 ounces water daily.  Encouraged to start a food diary,  and to consider  joining a support group. Sample diet sheets offered. Goals set by the patient for the next several months.   Weight /BMI 05/21/2018 04/23/2018 04/05/2017  WEIGHT 181 lb 1.3  oz 187 lb 188 lb  HEIGHT 5\' 7"  5\' 6"  5\' 6"   BMI 28.36 kg/m2 30.18 kg/m2 30.34 kg/m2      Need for zoster vaccine After obtaining informed consent, the vaccine is  administered , with no adverse effect noted at the time of administration.

## 2018-06-03 NOTE — Assessment & Plan Note (Signed)
Improved, she is applauded on this and encouraged to continue same  Patient re-educated about  the importance of commitment to a  minimum of 150 minutes of exercise per week as able.  The importance of healthy food choices with portion control discussed, as well as eating regularly and within a 12 hour window most days. The need to choose "clean , green" food 50 to 75% of the time is discussed, as well as to make water the primary drink and set a goal of 64 ounces water daily.  Encouraged to start a food diary,  and to consider  joining a support group. Sample diet sheets offered. Goals set by the patient for the next several months.   Weight /BMI 05/21/2018 04/23/2018 04/05/2017  WEIGHT 181 lb 1.3 oz 187 lb 188 lb  HEIGHT 5\' 7"  5\' 6"  5\' 6"   BMI 28.36 kg/m2 30.18 kg/m2 30.34 kg/m2

## 2018-06-03 NOTE — Assessment & Plan Note (Signed)
Symptomatic following recent unexpected separation from spouse of over 20 years, not suicidal or homicidal. Refer to therapist

## 2018-06-10 ENCOUNTER — Ambulatory Visit (HOSPITAL_COMMUNITY): Payer: 59

## 2018-06-11 ENCOUNTER — Ambulatory Visit (HOSPITAL_COMMUNITY)
Admission: RE | Admit: 2018-06-11 | Discharge: 2018-06-11 | Disposition: A | Discharge status: Home / Self Care | Payer: 59 | Source: Ambulatory Visit | Attending: Family Medicine | Admitting: Family Medicine

## 2018-06-11 DIAGNOSIS — R1013 Epigastric pain: Secondary | ICD-10-CM

## 2018-06-11 DIAGNOSIS — R142 Eructation: Secondary | ICD-10-CM | POA: Diagnosis not present

## 2018-06-11 NOTE — Telephone Encounter (Signed)
Spoke with pt gave her the number and advised her that physical therapy had been trying to reach out to her on multiple occasions. She said she would try to call them to set up the appt as we were hanging up.

## 2018-06-12 ENCOUNTER — Encounter: Payer: Self-pay | Admitting: Family Medicine

## 2018-06-13 NOTE — Telephone Encounter (Signed)
Pt called and LMOM - Thep will call her again - I gave them her cell #

## 2018-06-14 ENCOUNTER — Encounter: Payer: Self-pay | Admitting: Family Medicine

## 2018-06-18 ENCOUNTER — Other Ambulatory Visit (HOSPITAL_COMMUNITY): Payer: Self-pay | Admitting: Physical Medicine and Rehabilitation

## 2018-06-18 ENCOUNTER — Ambulatory Visit (HOSPITAL_COMMUNITY)
Admission: RE | Admit: 2018-06-18 | Discharge: 2018-06-18 | Disposition: A | Discharge status: Home / Self Care | Payer: 59 | Source: Ambulatory Visit | Attending: Physical Medicine and Rehabilitation | Admitting: Physical Medicine and Rehabilitation

## 2018-06-18 DIAGNOSIS — M50322 Other cervical disc degeneration at C5-C6 level: Secondary | ICD-10-CM | POA: Diagnosis not present

## 2018-06-18 DIAGNOSIS — M47812 Spondylosis without myelopathy or radiculopathy, cervical region: Secondary | ICD-10-CM | POA: Diagnosis not present

## 2018-06-18 DIAGNOSIS — M546 Pain in thoracic spine: Secondary | ICD-10-CM | POA: Diagnosis not present

## 2018-06-18 DIAGNOSIS — M542 Cervicalgia: Secondary | ICD-10-CM

## 2018-06-18 DIAGNOSIS — M4802 Spinal stenosis, cervical region: Secondary | ICD-10-CM | POA: Diagnosis not present

## 2018-06-18 DIAGNOSIS — M5412 Radiculopathy, cervical region: Secondary | ICD-10-CM | POA: Diagnosis not present

## 2018-06-18 DIAGNOSIS — M47814 Spondylosis without myelopathy or radiculopathy, thoracic region: Secondary | ICD-10-CM | POA: Diagnosis not present

## 2018-06-20 ENCOUNTER — Other Ambulatory Visit: Payer: Self-pay | Admitting: Physical Medicine and Rehabilitation

## 2018-06-20 DIAGNOSIS — M5382 Other specified dorsopathies, cervical region: Secondary | ICD-10-CM

## 2018-06-21 ENCOUNTER — Ambulatory Visit (HOSPITAL_COMMUNITY): Payer: 59 | Attending: Physical Therapy | Admitting: Physical Therapy

## 2018-07-09 DIAGNOSIS — K219 Gastro-esophageal reflux disease without esophagitis: Secondary | ICD-10-CM | POA: Diagnosis not present

## 2018-07-09 DIAGNOSIS — E739 Lactose intolerance, unspecified: Secondary | ICD-10-CM | POA: Diagnosis not present

## 2018-07-09 DIAGNOSIS — K5904 Chronic idiopathic constipation: Secondary | ICD-10-CM | POA: Diagnosis not present

## 2018-07-19 ENCOUNTER — Other Ambulatory Visit (HOSPITAL_COMMUNITY): Payer: Self-pay | Admitting: Physical Medicine and Rehabilitation

## 2018-07-19 DIAGNOSIS — M5382 Other specified dorsopathies, cervical region: Secondary | ICD-10-CM

## 2018-07-31 ENCOUNTER — Ambulatory Visit: Payer: 59 | Admitting: Gastroenterology

## 2018-08-16 DIAGNOSIS — G4733 Obstructive sleep apnea (adult) (pediatric): Secondary | ICD-10-CM | POA: Diagnosis not present

## 2018-08-20 ENCOUNTER — Ambulatory Visit: Payer: 59 | Admitting: Family Medicine

## 2018-08-21 ENCOUNTER — Ambulatory Visit: Payer: 59 | Admitting: Family Medicine

## 2018-08-22 ENCOUNTER — Ambulatory Visit (INDEPENDENT_AMBULATORY_CARE_PROVIDER_SITE_OTHER): Payer: 59 | Admitting: Family Medicine

## 2018-08-22 ENCOUNTER — Other Ambulatory Visit: Payer: Self-pay

## 2018-08-22 ENCOUNTER — Encounter: Payer: Self-pay | Admitting: Family Medicine

## 2018-08-22 VITALS — BP 125/78 | HR 72 | Ht 67.0 in | Wt 182.0 lb

## 2018-08-22 DIAGNOSIS — R142 Eructation: Secondary | ICD-10-CM

## 2018-08-22 DIAGNOSIS — M5090 Cervical disc disorder, unspecified, unspecified cervical region: Secondary | ICD-10-CM

## 2018-08-22 DIAGNOSIS — E663 Overweight: Secondary | ICD-10-CM

## 2018-08-22 DIAGNOSIS — E8881 Metabolic syndrome: Secondary | ICD-10-CM

## 2018-08-22 DIAGNOSIS — E038 Other specified hypothyroidism: Secondary | ICD-10-CM

## 2018-08-22 DIAGNOSIS — K219 Gastro-esophageal reflux disease without esophagitis: Secondary | ICD-10-CM

## 2018-08-22 DIAGNOSIS — E785 Hyperlipidemia, unspecified: Secondary | ICD-10-CM

## 2018-08-22 DIAGNOSIS — Z23 Encounter for immunization: Secondary | ICD-10-CM

## 2018-08-22 DIAGNOSIS — R7989 Other specified abnormal findings of blood chemistry: Secondary | ICD-10-CM

## 2018-08-22 DIAGNOSIS — R1013 Epigastric pain: Secondary | ICD-10-CM | POA: Diagnosis not present

## 2018-08-22 DIAGNOSIS — R7303 Prediabetes: Secondary | ICD-10-CM

## 2018-08-22 NOTE — Patient Instructions (Addendum)
F/U in 5 months, call if you need me sooner  Fasting lipid, cmp and eGFr and HBA1C week of May  25 please  Shingrix #2 this afternoon, call if you need me sooner Think about what you will eat, plan ahead. Choose " clean, green, fresh or frozen" over canned, processed or packaged foods which are more sugary, salty and fatty. 70 to 75% of food eaten should be vegetables and fruit. Three meals at set times with snacks allowed between meals, but they must be fruit or vegetables. Aim to eat over a 12 hour period , example 7 am to 7 pm, and STOP after  your last meal of the day. Drink water,generally about 64 ounces per day, no other drink is as healthy. Fruit juice is best enjoyed in a healthy way, by EATING the fruit.   It is important that you exercise regularly at least 30 minutes 5 times a week. If you develop chest pain, have severe difficulty breathing, or feel very tired, stop exercising immediately and seek medical attention   Please DO reach out to therapist as we discussed previously, although you report that overall you are " doing well", now additional stresses have been added to everyone's life  I whave sent Dr Oneida Alar a message re appt  For you  Social distancing. Frequent hand washing with soap and water Keeping your hands off of your face. These 3 practices will help to keep both you and your community healthy during this time. Please practice them faithfully!

## 2018-08-22 NOTE — Progress Notes (Signed)
Virtual Visit via Telephone Note  I connected with Stacy [name] on 08/22/18 at  1:00 PM EDT by telephone and verified that I am speaking with the correct person using two identifiers.  Location: Patient: home/ office Provider: office [employer] successful   I discussed the limitations, risks, security and privacy concerns of performing an evaluation and management service by telephone and the availability of in person appointments. I also discussed with the patient that there may be a patient responsible charge related to this service. The patient expressed understanding and agreed to proceed.   History of Present Illness: F/u chronic problems and review of recent lab Pt has concerns re elevated creatinine Has d/c metformin and notes weight gain , but does not want to resume, she has IGT C/o insufficient exercise, will work on this, and is aware that unhealthy snacks have increased with new additional stress in particular Has received CPAP equipment which fits and this is better Started management for neck and upper ext pain, MRI ordered by pain Specialist on hold currently Did see GI in [place] re GERD and and belching , wants to f/u in [place], symptoms better on current medication Has not contacted therapy but may do so,  She feels doing satisfactory, but also states should check with her children Denies recent fever or chills. Denies sinus pressure, nasal congestion, ear pain or sore throat. Denies chest congestion, productive cough or wheezing. Denies chest pains, palpitations and leg swelling Denies uncontrolled  abdominal pain, nausea, vomiting,diarrhea or constipation.   Denies dysuria, frequency, hesitancy or incontinence. Denies headaches, seizures, numbness, or tingling. Denies depression, anxiety or insomnia. Denies skin break down or rash.       Observations/Objective: BP 125/78   Pulse 72   Ht 5\' 7"  (1.702 m)   Wt 182 lb (82.6 kg)   BMI 28.51 kg/m  Good  communication with no confusion and intact memory. Alert and oriented x 3 No signs of respiratory distress during sppech    Assessment and Plan: Dyspepsia Reports improvement on current medication, wants local f/u , was seen in [place] after waiting on an appt locally. Requests f/u with dr Oneida Alar when she is available , no longer as urgent a need, will send Provider a message  Hypothyroidism Managed by Endo who assess twice yearly  Cervical back pain with evidence of disc disease MRI C spine pending, ordered through pain specialist  GERD Improved on current medication  Overweight (BMI 25.0-29.9)   Patient re-educated about  the importance of commitment to a  minimum of 150 minutes of exercise per week as able.  The importance of healthy food choices with portion control discussed, as well as eating regularly and within a 12 hour window most days. The need to choose "clean , green" food 50 to 75% of the time is discussed, as well as to make water the primary drink and set a goal of 64 ounces water daily.     Weight /BMI 08/22/2018 05/21/2018 04/23/2018  WEIGHT 182 lb 181 lb 1.3 oz 187 lb  HEIGHT 5\' 7"  5\' 7"  5\' 6"   BMI 28.51 kg/m2 28.36 kg/m2 30.18 kg/m2      Prediabetes Patient educated about the importance of limiting  Carbohydrate intake , the need to commit to daily physical activity for a minimum of 30 minutes , and to commit weight loss. The fact that changes in all these areas will reduce or eliminate all together the development of diabetes is stressed.   Diabetic Labs Latest  Ref Rng & Units 04/22/2018 04/03/2017 08/17/2016 03/09/2015 08/11/2014  HbA1c <5.7 % of total Hgb 6.0(H) 5.9(H) 5.8(H) 5.7(H) 6.0(H)  Chol <200 mg/dL - 209(H) - 134 205(H)  HDL >50 mg/dL - 49(L) - 46 50  Calc LDL mg/dL (calc) - 131(H) - 69 134(H)  Triglycerides <150 mg/dL - 156(H) - 93 107  Creatinine 0.44 - 1.00 mg/dL - - 1.03(H) 0.87 0.97   BP/Weight 08/22/2018 05/21/2018 04/23/2018 12/21/2017  04/05/2017 02/07/2017 63/11/7562  Systolic BP 332 951 884 166 063 016 010  Diastolic BP 78 72 75 75 68 61 76  Wt. (Lbs) 182 181.08 187 - 188 184.6 187.5  BMI 28.51 28.36 30.18 - 30.34 29.8 30.26   Foot/eye exam completion dates Latest Ref Rng & Units 12/09/2013  Eye Exam No Retinopathy No Retinopathy  Foot Form Completion - -    Pt opting to stop metformin in past 2 months rept HBA1C prior to f/u  Elevated serum creatinine mildl elevation to 1.07 in labs of 07/09/2018 Pt advised to stop proptein powder and increase vegetable and fruit and water   Need for zoster vaccine Has elected to hold off for a few more weeks to receive #2, states first gave her flu like symptoms    Follow Up Instructions:    I discussed the assessment and treatment plan with the patient. The patient was provided an opportunity to ask questions and all were answered. The patient agreed with the plan and demonstrated an understanding of the instructions.   The patient was advised to call back or seek an in-person evaluation if the symptoms worsen or if the condition fails to improve as anticipated.  I provided 25 minutes of non-face-to-face time during this encounter.   [name], MD

## 2018-08-23 ENCOUNTER — Ambulatory Visit (HOSPITAL_COMMUNITY): Payer: 59

## 2018-08-26 ENCOUNTER — Encounter: Payer: Self-pay | Admitting: Family Medicine

## 2018-08-26 DIAGNOSIS — R7989 Other specified abnormal findings of blood chemistry: Secondary | ICD-10-CM | POA: Insufficient documentation

## 2018-08-26 NOTE — Assessment & Plan Note (Signed)
Managed by Endo who assess twice yearly

## 2018-08-26 NOTE — Assessment & Plan Note (Signed)
Patient educated about the importance of limiting  Carbohydrate intake , the need to commit to daily physical activity for a minimum of 30 minutes , and to commit weight loss. The fact that changes in all these areas will reduce or eliminate all together the development of diabetes is stressed.   Diabetic Labs Latest Ref Rng & Units 04/22/2018 04/03/2017 08/17/2016 03/09/2015 08/11/2014  HbA1c <5.7 % of total Hgb 6.0(H) 5.9(H) 5.8(H) 5.7(H) 6.0(H)  Chol <200 mg/dL - 209(H) - 134 205(H)  HDL >50 mg/dL - 49(L) - 46 50  Calc LDL mg/dL (calc) - 131(H) - 69 134(H)  Triglycerides <150 mg/dL - 156(H) - 93 107  Creatinine 0.44 - 1.00 mg/dL - - 1.03(H) 0.87 0.97   BP/Weight 08/22/2018 05/21/2018 04/23/2018 12/21/2017 04/05/2017 02/07/2017 81/05/7515  Systolic BP 001 749 449 675 916 384 665  Diastolic BP 78 72 75 75 68 61 76  Wt. (Lbs) 182 181.08 187 - 188 184.6 187.5  BMI 28.51 28.36 30.18 - 30.34 29.8 30.26   Foot/eye exam completion dates Latest Ref Rng & Units 12/09/2013  Eye Exam No Retinopathy No Retinopathy  Foot Form Completion - -    Pt opting to stop metformin in past 2 months rept HBA1C prior to f/u

## 2018-08-26 NOTE — Telephone Encounter (Signed)
OK TO [place] FOR [facility] W/ SLF DX: GERD/DYSPEPSIA IN 2-3 MOS.

## 2018-08-26 NOTE — Assessment & Plan Note (Signed)
Improved on current medication

## 2018-08-26 NOTE — Assessment & Plan Note (Signed)
   Patient re-educated about  the importance of commitment to a  minimum of 150 minutes of exercise per week as able.  The importance of healthy food choices with portion control discussed, as well as eating regularly and within a 12 hour window most days. The need to choose "clean , green" food 50 to 75% of the time is discussed, as well as to make water the primary drink and set a goal of 64 ounces water daily.     Weight /BMI 08/22/2018 05/21/2018 04/23/2018  WEIGHT 182 lb 181 lb 1.3 oz 187 lb  HEIGHT 5\' 7"  5\' 7"  5\' 6"   BMI 28.51 kg/m2 28.36 kg/m2 30.18 kg/m2

## 2018-08-26 NOTE — Assessment & Plan Note (Signed)
Reports improvement on current medication, wants local f/u , was seen in [place] after waiting on an appt locally. Requests f/u with dr Oneida Alar when she is available , no longer as urgent a need, will send Provider a message

## 2018-08-26 NOTE — Assessment & Plan Note (Signed)
MRI C spine pending, ordered through pain specialist

## 2018-08-26 NOTE — Assessment & Plan Note (Signed)
Has elected to hold off for a few more weeks to receive #2, states first gave her flu like symptoms

## 2018-08-26 NOTE — Telephone Encounter (Signed)
-----   Message from Fayrene Helper, MD sent at 08/26/2018  7:27 AM EDT ----- Regarding: help please! GM!!!  Pt is requesting specifically to see YOU re her GERD, excess belching and dyspepsia symptoms. Was referred earlier this year, and went to GI in [place] when "waiting on an appt"  Doing better on PPI, but wants f/u care locally with you.  States "not urgent" as some meds were prescribed which are working  Pls handle as best able,  Thanks,  Stacy [name]  PS: let's plan on a week like the day looks today:-)

## 2018-08-26 NOTE — Assessment & Plan Note (Signed)
mildl elevation to 1.07 in labs of 07/09/2018 Pt advised to stop proptein powder and increase vegetable and fruit and water

## 2018-08-29 ENCOUNTER — Ambulatory Visit (INDEPENDENT_AMBULATORY_CARE_PROVIDER_SITE_OTHER): Payer: 59 | Admitting: Gastroenterology

## 2018-08-29 ENCOUNTER — Other Ambulatory Visit: Payer: Self-pay

## 2018-08-29 ENCOUNTER — Encounter: Payer: Self-pay | Admitting: Gastroenterology

## 2018-08-29 DIAGNOSIS — K219 Gastro-esophageal reflux disease without esophagitis: Secondary | ICD-10-CM | POA: Diagnosis not present

## 2018-08-29 NOTE — Assessment & Plan Note (Signed)
SYMPTOMS CONTROLLED/RESOLVED ON DIET WITH OCCASIONAL FLARES.  DRINK WATER TO KEEP YOUR URINE LIGHT YELLOW. AVOID REFLUX TRIGGERS.  HANDOUT GIVEN. CONSIDER YOGA 30 MINS THREE TIMES A WEEK. LOOK AT CLASSES ON YOU TUBE OR TRY YOGA INTERNATIONAL. WHEN YOU ARE HAVING A FLARE USE CONTINUE PROTONIX. TAKE 30 MINUTES PRIOR TO MEALS TWICE DAILY FOR 2 WEEKS. CALL WITH QUESTIONS OR CONCERNS. FOLLOW UP IN 6 MOS.

## 2018-08-29 NOTE — Progress Notes (Signed)
Subjective:    Patient ID: Stacy [name], female    DOB: 11/12/1960, 58 y.o.   MRN: [id number]  Primary Care Physician:  Fayrene Helper, MD  Primary GI:  Barney Drain, MD   Patient Location: home   Provider Location: [facility] office   Reason for Visit: GERD   Persons present on the virtual encounter, with roles: patient, myself (provider), [place] (update meds/allergies)   Total time (minutes) spent on medical discussion:  10 MINUTES   Due to COVID-19, visit was VIA TELEPHONE VISIT DUE TO COVID 19. VISIT IS CONDUCTED VIRTUALLY AND WAS REQUESTED BY PATIENT.   Virtual Visit via TELEPHONE   I connected with [facility] and verified that I am speaking with the correct person using two identifiers.   I discussed the limitations, risks, security and privacy concerns of performing an evaluation and management service by telephone/video and the availability of in person appointments. I also discussed with the patient that there may be a patient responsible charge related to this service. The patient expressed understanding and agreed to proceed.   HPI HEARTBURN WAS OUT OF CONTROL BUT NOW EATING BETTER. HEARTBURN CONTROLLED. NO OTHER ISSUES. TRYING TO LOSE WEIGHT BECAUSE NOT INTERESTED IN TAKING MEDS FOR DIABETES.   Past Medical History:  Diagnosis Date  . Abnormal uterine bleeding   . Chronic leukopenia 08/17/2016   WBC = 3.5  . DDD (degenerative disc disease), cervical   . Dyslipidemia   . Elevated serum creatinine 08/17/2016  . GERD (gastroesophageal reflux disease)   . History of esophageal dilatation    2013  . History of ovarian cyst   . Hypothyroidism   . Lactose intolerance   . OSA on CPAP    study done 04-08-2013  . Pre-diabetes   . Right thyroid nodule   . SUI (stress urinary incontinence, female)   . Wears glasses     Past Surgical History:  Procedure Laterality Date  . CESAREAN SECTION  1997   Twins-1 twin del.by c/s, 1 del.vaginally  .  COLONOSCOPY  04/04/2011   Internal hemorrhoids/Nl colonoscopy-SLIGHTLY TORTUOUS COLON  . DILATATION & CURETTAGE/HYSTEROSCOPY WITH MYOSURE N/A 08/24/2016   Procedure: DILATATION & CURETTAGE/HYSTEROSCOPY WITH MYOSURE Possible Myosure ;  Surgeon: Nunzio Cobbs, MD;  Location: [facility];  Service: Gynecology;  Laterality: N/A;  . DILATION AND CURETTAGE OF UTERUS  1998   Seconday to miscarriage  . ESOPHAGOGASTRODUODENOSCOPY  09/22/2011   Mild gastritis/Hiatal hernia/DYSPHAGIA DUE TO PEPTIC STRICTURE/UNCONTROLLED GERD  . KNEE ARTHROSCOPY Right 2003  . MALONEY DILATION  09/22/2011   Procedure: MALONEY DILATION;  Surgeon: Danie Binder, MD;  [facility];  Service: Endoscopy;  Laterality: N/A;  . SAVORY DILATION  09/22/2011   Procedure: SAVORY DILATION;  Surgeon: Danie Binder, MD;  [facility];  Service: Endoscopy;  Laterality: N/A;   Allergies  Allergen Reactions  . Lactose Intolerance (Gi)     Vomiting, diarrhea, bloating   Current Outpatient Medications  Medication Sig    . B Complex Vitamins (B COMPLEX PO) Take by mouth daily.    . Ginkgo Biloba (GINKOBA PO) Take by mouth. Once daily    . levothyroxine (SYNTHROID, LEVOTHROID) 137 MCG tablet Take 1 tablet (137 mcg total) by mouth daily.    . Multiple Vitamin (MULTIVITAMIN) tablet Take 1 tablet by mouth daily.    Marland Kitchen PROTEIN PO Take by mouth. drinks    . vitamin C (ASCORBIC ACID) 500 MG tablet Take 500  mg by mouth daily.    .       Review of Systems PER HPI OTHERWISE ALL SYSTEMS ARE NEGATIVE.    Objective:   Physical Exam TELEPHONE VISIT DUE TO COVID 19, VISIT IS CONDUCTED VIRTUALLY AND WAS REQUESTED BY PATIENT.    Assessment & Plan:

## 2018-08-29 NOTE — Patient Instructions (Addendum)
DRINK WATER TO KEEP YOUR URINE LIGHT YELLOW.  AVOID REFLUX TRIGGERS. SEE INFO BELOW.  CONSIDER YOGA 30 MINS THREE TIMES A WEEK. LOOK AT CLASSES ON YOU TUBE OR TRY YOGA INTERNATIONAL.  WHEN YOU ARE HAVING A FLARE USE CONTINUE PROTONIX. TAKE 30 MINUTES PRIOR TO MEALS TWICE DAILY FOR 2 WEEKS.  PLEASE CALL WITH QUESTIONS OR CONCERNS.  FOLLOW UP IN 6 MOS.   Lifestyle and home remedies TO CONTROL HEARTBURN You may eliminate or reduce the frequency of heartburn by making the following lifestyle changes:  . Control your weight. Being overweight is a major risk factor for heartburn and GERD. Excess pounds put pressure on your abdomen, pushing up your stomach and causing acid to back up into your esophagus.   . Eat smaller meals. 4 TO 6 MEALS A DAY. This reduces pressure on the lower esophageal sphincter, helping to prevent the valve from opening and acid from washing back into your esophagus.   Dolphus Jenny your belt. Clothes that fit tightly around your waist put pressure on your abdomen and the lower esophageal sphincter.   . Eliminate heartburn triggers. Everyone has specific triggers. Common triggers such as fatty or fried foods, spicy food, tomato sauce, carbonated beverages, alcohol, chocolate, mint, garlic, onion, caffeine and nicotine may make heartburn worse.   Marland Kitchen Avoid stooping or bending. Tying your shoes is OK. Bending over for longer periods to weed your garden isn't, especially soon after eating.   . Don't lie down after a meal. Wait at least three to four hours after eating before going to bed, and don't lie down right after eating.   Alternative medicine . Several home remedies exist for treating GERD, but they provide only temporary relief. They include drinking baking soda (sodium bicarbonate) added to water or drinking other fluids such as baking soda mixed with cream of tartar and water. . Although these liquids create temporary relief by neutralizing, washing away or buffering  acids, eventually they aggravate the situation by adding gas and fluid to your stomach, increasing pressure and causing more acid reflux. Further, adding more sodium to your diet may increase your blood pressure and add stress to your heart, and excessive bicarbonate ingestion can alter the acid-base balance in your body.

## 2018-09-02 NOTE — Progress Notes (Signed)
ON RECALL  °

## 2018-09-02 NOTE — Progress Notes (Signed)
CC'D TO PCP °

## 2018-09-27 ENCOUNTER — Encounter (HOSPITAL_COMMUNITY): Payer: Self-pay

## 2018-09-27 ENCOUNTER — Ambulatory Visit (HOSPITAL_COMMUNITY): Payer: 59

## 2018-10-16 ENCOUNTER — Ambulatory Visit: Payer: Self-pay | Admitting: Gastroenterology

## 2018-11-06 ENCOUNTER — Encounter: Payer: Self-pay | Admitting: Family Medicine

## 2018-11-06 LAB — COMPLETE METABOLIC PANEL WITH GFR
AG Ratio: 1.1 (calc) (ref 1.0–2.5)
ALT: 23 U/L (ref 6–29)
AST: 21 U/L (ref 10–35)
Albumin: 4 g/dL (ref 3.6–5.1)
Alkaline phosphatase (APISO): 70 U/L (ref 37–153)
BUN/Creatinine Ratio: 11 (calc) (ref 6–22)
BUN: 12 mg/dL (ref 7–25)
CO2: 30 mmol/L (ref 20–32)
Calcium: 9.6 mg/dL (ref 8.6–10.4)
Chloride: 104 mmol/L (ref 98–110)
Creat: 1.11 mg/dL — ABNORMAL HIGH (ref 0.50–1.05)
GFR, Est African American: 63 mL/min/{1.73_m2} (ref >=60)
GFR, Est Non African American: 55 mL/min/{1.73_m2} — ABNORMAL LOW (ref >=60)
Globulin: 3.5 g/dL (calc) (ref 1.9–3.7)
Glucose, Bld: 97 mg/dL (ref 65–99)
Potassium: 4 mmol/L (ref 3.5–5.3)
Sodium: 140 mmol/L (ref 135–146)
Total Bilirubin: 0.6 mg/dL (ref 0.2–1.2)
Total Protein: 7.5 g/dL (ref 6.1–8.1)

## 2018-11-06 LAB — LIPID PANEL
Cholesterol: 201 mg/dL — ABNORMAL HIGH (ref <200)
HDL: 48 mg/dL — ABNORMAL LOW (ref >=50)
LDL Cholesterol (Calc): 128 mg/dL (calc) — ABNORMAL HIGH
Non-HDL Cholesterol (Calc): 153 mg/dL (calc) — ABNORMAL HIGH (ref <130)
Total CHOL/HDL Ratio: 4.2 (calc) (ref <5.0)
Triglycerides: 133 mg/dL (ref <150)

## 2018-11-06 LAB — HEMOGLOBIN A1C
Hgb A1c MFr Bld: 5.8 % of total Hgb — ABNORMAL HIGH (ref <5.7)
Mean Plasma Glucose: 120 (calc)
eAG (mmol/L): 6.6 (calc)

## 2018-11-18 ENCOUNTER — Ambulatory Visit (INDEPENDENT_AMBULATORY_CARE_PROVIDER_SITE_OTHER): Payer: 59 | Admitting: Family Medicine

## 2018-11-18 ENCOUNTER — Other Ambulatory Visit: Payer: Self-pay

## 2018-11-18 ENCOUNTER — Encounter: Payer: Self-pay | Admitting: Family Medicine

## 2018-11-18 VITALS — BP 120/70 | HR 64 | Resp 12 | Ht 67.0 in | Wt 184.0 lb

## 2018-11-18 DIAGNOSIS — E663 Overweight: Secondary | ICD-10-CM | POA: Diagnosis not present

## 2018-11-18 DIAGNOSIS — L723 Sebaceous cyst: Secondary | ICD-10-CM | POA: Diagnosis not present

## 2018-11-18 DIAGNOSIS — Z23 Encounter for immunization: Secondary | ICD-10-CM | POA: Diagnosis not present

## 2018-11-18 DIAGNOSIS — E038 Other specified hypothyroidism: Secondary | ICD-10-CM

## 2018-11-18 DIAGNOSIS — L089 Local infection of the skin and subcutaneous tissue, unspecified: Secondary | ICD-10-CM

## 2018-11-18 MED ORDER — FLUCONAZOLE 150 MG PO TABS: ORAL_TABLET | ORAL | 0 refills | Status: DC

## 2018-11-18 MED ORDER — DOXYCYCLINE HYCLATE 100 MG PO TABS: 100.0000 mg | ORAL_TABLET | Freq: Two times a day (BID) | ORAL | 0 refills | Status: DC

## 2018-11-18 NOTE — Progress Notes (Signed)
   Stacy [name]     MRN: [id number]      DOB: 1960/12/19   HPI Ms. Salvucci is here with a 2 day h/o paingul right breast swelling, no overt drainage, no fevr or chills, first incident. No known aggravating factor  ROS Denies recent fever or chills. Denies sinus pressure, nasal congestion, ear pain or sore throat. Denies chest congestion, productive cough or wheezing. Denies chest pains, palpitations and leg swelling  Denies depression, anxiety or insomnia.    PE  BP 120/70   Pulse 64   Resp 12   Ht 5\' 7"  (1.702 m)   Wt 184 lb (83.5 kg)   SpO2 97%   BMI 28.82 kg/m   Patient alert and oriented and in no cardiopulmonary distress.  HEENT: No facial asymmetry, EOMI,   oropharynx pink and moist.  Neck supple no JVD, no mass.  Chest: Clear to auscultation bilaterally. Breast: left breast no mass, tenderness or nipple d/c or adenopathy Right breast , tender SQ mass at 9 O clock on right breast, diameter approx 2 cm, no material extruded, no warmth or tenderness CVS: S1, S2 no murmurs, no S3.Regular rate.  ABD: Soft non tender.   Ext: No edema  MS: Adequate ROM spine, shoulders, hips and knees.  Skin: Intact, no ulcerations or rash noted.  Psych: Good eye contact, normal affect. Memory intact not anxious or depressed appearing.  CNS: CN 2-12 intact, power,  normal throughout.no focal deficits noted.   Assessment & Plan  Infected sebaceous cyst Acute onset, 10 day antibiotic course, advised no trauma, call if not completely resolved or worsens  Hypothyroidism mnaged by Endo, controlled  Need for zoster vaccine Refused 2nd xhingrix dose , reports adverse reaction chills and body  Aches after shingrix 1  Overweight (BMI 25.0-29.9)  Patient re-educated about  the importance of commitment to a  minimum of 150 minutes of exercise per week as able.  The importance of healthy food choices with portion control discussed, as well as eating regularly and within a 12  hour window most days. The need to choose "clean , green" food 50 to 75% of the time is discussed, as well as to make water the primary drink and set a goal of 64 ounces water daily.    Weight /BMI 11/18/2018 08/22/2018 05/21/2018  WEIGHT 184 lb 182 lb 181 lb 1.3 oz  HEIGHT 5\' 7"  5\' 7"  5\' 7"   BMI 28.82 kg/m2 28.51 kg/m2 28.36 kg/m2

## 2018-11-18 NOTE — Assessment & Plan Note (Signed)
mnaged by Endo, controlled

## 2018-11-18 NOTE — Patient Instructions (Addendum)
Follow-up as before call if you need me sooner.  You are treated today for an infected cyst on the right breast at 9 o'clock position.  10-day course of the antibiotic is prescribed it is important to take the entire course.  This should totally clear the area of concern however if this remains a  persistent concern please call back . Please do not traumatize the area, warm and / or cool compresses are permitted for pain relief also you may take  Tylenol for pain relief, up to 4 per day  Shingrix No. 2 today and this visit.

## 2018-11-18 NOTE — Assessment & Plan Note (Signed)
Refused 2nd xhingrix dose , reports adverse reaction chills and body  Aches after shingrix 1

## 2018-11-18 NOTE — Assessment & Plan Note (Signed)
Acute onset, 10 day antibiotic course, advised no trauma, call if not completely resolved or worsens

## 2018-11-18 NOTE — Assessment & Plan Note (Signed)
  Patient re-educated about  the importance of commitment to a  minimum of 150 minutes of exercise per week as able.  The importance of healthy food choices with portion control discussed, as well as eating regularly and within a 12 hour window most days. The need to choose "clean , green" food 50 to 75% of the time is discussed, as well as to make water the primary drink and set a goal of 64 ounces water daily.    Weight /BMI 11/18/2018 08/22/2018 05/21/2018  WEIGHT 184 lb 182 lb 181 lb 1.3 oz  HEIGHT 5\' 7"  5\' 7"  5\' 7"   BMI 28.82 kg/m2 28.51 kg/m2 28.36 kg/m2

## 2018-11-21 ENCOUNTER — Telehealth: Payer: Self-pay

## 2018-11-21 DIAGNOSIS — E039 Hypothyroidism, unspecified: Secondary | ICD-10-CM

## 2018-11-21 NOTE — Telephone Encounter (Signed)
LeighAnn Ahnaf Caponi, CMA  

## 2018-11-22 ENCOUNTER — Ambulatory Visit: Payer: 59 | Admitting: "Endocrinology

## 2018-12-04 ENCOUNTER — Ambulatory Visit: Payer: 59 | Admitting: "Endocrinology

## 2018-12-04 LAB — TSH: TSH: 3.65 mIU/L (ref 0.40–4.50)

## 2018-12-04 LAB — T4, FREE: Free T4: 1.1 ng/dL (ref 0.8–1.8)

## 2018-12-09 ENCOUNTER — Ambulatory Visit (INDEPENDENT_AMBULATORY_CARE_PROVIDER_SITE_OTHER): Payer: 59 | Admitting: "Endocrinology

## 2018-12-09 ENCOUNTER — Other Ambulatory Visit: Payer: Self-pay

## 2018-12-09 ENCOUNTER — Encounter: Payer: Self-pay | Admitting: "Endocrinology

## 2018-12-09 DIAGNOSIS — E042 Nontoxic multinodular goiter: Secondary | ICD-10-CM

## 2018-12-09 DIAGNOSIS — R7303 Prediabetes: Secondary | ICD-10-CM

## 2018-12-09 DIAGNOSIS — E039 Hypothyroidism, unspecified: Secondary | ICD-10-CM

## 2018-12-09 MED ORDER — LEVOTHYROXINE SODIUM 137 MCG PO TABS: 137.0000 ug | ORAL_TABLET | Freq: Every day | ORAL | 1 refills | Status: DC

## 2018-12-09 NOTE — Progress Notes (Signed)
12/09/2018                                Endocrinology Telehealth Visit Follow up Note -During COVID -19 Pandemic  I connected with Stacy [name] on 12/09/2018   by telephone and verified that I am speaking with the correct person using two identifiers. Stacy [name], 1960-07-05. she has verbally consented to this visit. All issues noted in this document were discussed and addressed. The format was not optimal for physical exam.   Subjective:    Patient ID: Stacy [name], female    DOB: [birth year],  PCP Fayrene Helper, MD   Past Medical History:  Diagnosis Date  . Abnormal uterine bleeding   . Chronic leukopenia 08/17/2016   WBC = 3.5  . DDD (degenerative disc disease), cervical   . Dyslipidemia   . Elevated serum creatinine 08/17/2016  . GERD (gastroesophageal reflux disease)   . History of esophageal dilatation    2013  . History of ovarian cyst   . Hypothyroidism   . Lactose intolerance   . OSA on CPAP    study done 04-08-2013  . Pre-diabetes   . Right thyroid nodule   . SUI (stress urinary incontinence, female)   . Wears glasses    Past Surgical History:  Procedure Laterality Date  . CESAREAN SECTION  1997   Twins-1 twin del.by c/s, 1 del.vaginally  . COLONOSCOPY  04/04/2011   Internal hemorrhoids/Nl colonoscopy-SLIGHTLY TORTUOUS COLON  . DILATATION & CURETTAGE/HYSTEROSCOPY WITH MYOSURE N/A 08/24/2016   Procedure: DILATATION & CURETTAGE/HYSTEROSCOPY WITH MYOSURE Possible Myosure ;  Surgeon: Nunzio Cobbs, MD;  Location: [facility];  Service: Gynecology;  Laterality: N/A;  . DILATION AND CURETTAGE OF UTERUS  1998   Seconday to miscarriage  . ESOPHAGOGASTRODUODENOSCOPY  09/22/2011   Mild gastritis/Hiatal hernia/DYSPHAGIA DUE TO PEPTIC STRICTURE/UNCONTROLLED GERD  . KNEE ARTHROSCOPY Right 2003  . MALONEY DILATION  09/22/2011   Procedure: MALONEY DILATION;  Surgeon: Danie Binder, MD;  Location: [facility];  Service: Endoscopy;  Laterality: N/A;   . SAVORY DILATION  09/22/2011   Procedure: SAVORY DILATION;  Surgeon: Danie Binder, MD;  Location: [facility];  Service: Endoscopy;  Laterality: N/A;   Social History   Socioeconomic History  . Marital status: Married    Spouse name: Not on file  . Number of children: Not on file  . Years of education: Not on file  . Highest education level: Not on file  Occupational History  . Occupation: Full time: Architectural technologist  Social Needs  . Financial resource strain: Not on file  . Food insecurity    Worry: Not on file    Inability: Not on file  . Transportation needs    Medical: Not on file    Non-medical: Not on file  Tobacco Use  . Smoking status: Never Smoker  . Smokeless tobacco: Never Used  Substance and Sexual Activity  . Alcohol use: No  . Drug use: No  . Sexual activity: Yes    Partners: Male    Birth control/protection: Other-see comments    Comment: husband with vasectomy  Lifestyle  . Physical activity    Days per week: Not on file    Minutes per session: Not on file  . Stress: Not on file  Relationships  . Social Herbalist on phone: Not on file    Gets together: Not  on file    Attends religious service: Not on file    Active member of club or organization: Not on file    Attends meetings of clubs or organizations: Not on file    Relationship status: Not on file  Other Topics Concern  . Not on file  Social History Narrative   Married to [name], neurology   Regular exercise: No   Outpatient Encounter Medications as of 12/09/2018  Medication Sig  . B Complex Vitamins (B COMPLEX PO) Take by mouth daily.  Marland Kitchen doxycycline (VIBRA-TABS) 100 MG tablet Take 1 tablet (100 mg total) by mouth 2 (two) times daily.  . fluconazole (DIFLUCAN) 150 MG tablet Take one tablet once daily, as needed, for vaginal yeast infection associated with antibiotic use  . Ginkgo Biloba (GINKOBA PO) Take by mouth. Once daily  . levothyroxine (SYNTHROID) 137 MCG tablet Take 1  tablet (137 mcg total) by mouth daily before breakfast.  . Multiple Vitamin (MULTIVITAMIN) tablet Take 1 tablet by mouth daily.  . vitamin C (ASCORBIC ACID) 500 MG tablet Take 500 mg by mouth daily.  . [DISCONTINUED] levothyroxine (SYNTHROID, LEVOTHROID) 137 MCG tablet Take 1 tablet (137 mcg total) by mouth daily.   No facility-administered encounter medications on file as of 12/09/2018.    ALLERGIES: Allergies  Allergen Reactions  . Lactose Intolerance (Gi)     Vomiting, diarrhea, bloating   VACCINATION STATUS: Immunization History  Administered Date(s) Administered  . Influenza Split 02/10/2014  . Influenza,inj,Quad PF,6+ Mos 01/30/2013, 01/12/2015, 01/30/2017, 04/22/2018  . Tdap 05/09/2012  . Zoster Recombinat (Shingrix) 05/21/2018    HPI  58 yr old female with hypothyroidism from Hashimoto's thyroiditis. She is currently on levothyroxine 137 mcg p.o. daily in the morning with much better consistency.  She denies palpitations, tremors, heat intolerance. she denies any family hx of thyroid dysfunction. She has a stable  1.2 cm right thyroid lobe nodule growing slightly from 9 mm  in 3 years . -She is a mother of  4 grown children, with hx of HEELP syndrome during the first pregnancy. -She did not tolerate metformin which was prescribed due to prediabetes, lowered her A1c to 5.8% on 1-medication.     Review of Systems Limited as above.  Objective:    There were no vitals taken for this visit.  Wt Readings from Last 3 Encounters:  11/18/18 184 lb (83.5 kg)  08/22/18 182 lb (82.6 kg)  05/21/18 181 lb 1.3 oz (82.1 kg)      Results for orders placed or performed in visit on 11/21/18  TSH  Result Value Ref Range   TSH 3.65 0.40 - 4.50 mIU/L  T4, Free  Result Value Ref Range   Free T4 1.1 0.8 - 1.8 ng/dL   Chemistry (most recent): Lab Results  Component Value Date   NA 140 11/05/2018   K 4.0 11/05/2018   CL 104 11/05/2018   CO2 30 11/05/2018   BUN 12 11/05/2018    CREATININE 1.11 (H) 11/05/2018   Diabetic Labs (most recent): Lab Results  Component Value Date   HGBA1C 5.8 (H) 11/05/2018   HGBA1C 6.0 (H) 04/22/2018   HGBA1C 5.9 (H) 04/03/2017   Lipid Panel     Component Value Date/Time   CHOL 201 (H) 11/05/2018 1030   TRIG 133 11/05/2018 1030   HDL 48 (L) 11/05/2018 1030   CHOLHDL 4.2 11/05/2018 1030   VLDL 19 03/09/2015 1509   LDLCALC 128 (H) 11/05/2018 1030     Assessment &  Plan:   1) hypothyroidism from Hashimoto's thyroiditis:   - Her thyroid function test are consistent with improvement, however she would benefit from slight increase in her levothyroxine.   -I discussed and increased her levothyroxine to 150 mcg p.o. daily before breakfast.  - she  admits there is a room for improvement in her diet and drink choices. -  Suggestion is made for her to avoid simple carbohydrates  from her diet including Cakes, Sweet Desserts / Pastries, Ice Cream, Soda (diet and regular), Sweet Tea, Candies, Chips, Cookies, Sweet Pastries,  Store Bought Juices, Alcohol in Excess of  1-2 drinks a day, Artificial Sweeteners, Coffee Creamer, and "Sugar-free" Products. This will help patient to have stable blood glucose profile and potentially avoid unintended weight gain.  2) multinodular goiter  -Her thyroid ultrasound shows a right sided nodule grew from 9 mm to 12 mm over 3 year period of time. This is consistent with features of benign thyroid nodules.  She will not need antithyroid intervention at this point.  She will have repeat surveillance thyroid ultrasound before her next visit in 6 months.      3) prediabetes:  -Her A1c is improving to 5.8%, did not tolerate metformin.    - she  admits there is a room for improvement in her diet and drink choices. -  Suggestion is made for her to avoid simple carbohydrates  from her diet including Cakes, Sweet Desserts / Pastries, Ice Cream, Soda (diet and regular), Sweet Tea, Candies, Chips, Cookies, Sweet  Pastries,  Store Bought Juices, Alcohol in Excess of  1-2 drinks a day, Artificial Sweeteners, Coffee Creamer, and "Sugar-free" Products. This will help patient to have stable blood glucose profile and potentially avoid unintended weight gain.  - I advised her to be consistent with her omega-3 fatty acids 1000 mg total daily.   Time for this visit: 15 minutes. Stacy [name]  participated in the discussions, expressed understanding, and voiced agreement with the above plans.  All questions were answered to her satisfaction. she is encouraged to contact clinic should she have any questions or concerns prior to her return visit.  Follow up plan: Return in about 6 months (around 06/11/2019) for Follow up with Pre-visit Labs.  Glade Lloyd, MD Phone: [phone]  Fax: [phone]  -  This note was partially dictated with voice recognition software. Similar sounding words can be transcribed inadequately or may not  be corrected upon review.  12/09/2018, 4:11 PM

## 2019-01-29 ENCOUNTER — Telehealth: Payer: Self-pay

## 2019-01-29 ENCOUNTER — Encounter: Payer: Self-pay | Admitting: Family Medicine

## 2019-01-29 ENCOUNTER — Ambulatory Visit
Admission: EM | Admit: 2019-01-29 | Discharge: 2019-01-29 | Disposition: A | Discharge status: Home / Self Care | Payer: 59 | Attending: Emergency Medicine | Admitting: Emergency Medicine

## 2019-01-29 ENCOUNTER — Other Ambulatory Visit: Payer: Self-pay

## 2019-01-29 ENCOUNTER — Ambulatory Visit (INDEPENDENT_AMBULATORY_CARE_PROVIDER_SITE_OTHER): Payer: 59 | Admitting: Family Medicine

## 2019-01-29 ENCOUNTER — Ambulatory Visit (INDEPENDENT_AMBULATORY_CARE_PROVIDER_SITE_OTHER): Payer: 59

## 2019-01-29 VITALS — BP 120/78 | HR 77 | Temp 98.3°F | Resp 15 | Ht 67.0 in | Wt 187.0 lb

## 2019-01-29 DIAGNOSIS — E663 Overweight: Secondary | ICD-10-CM

## 2019-01-29 DIAGNOSIS — E8881 Metabolic syndrome: Secondary | ICD-10-CM | POA: Diagnosis not present

## 2019-01-29 DIAGNOSIS — E039 Hypothyroidism, unspecified: Secondary | ICD-10-CM

## 2019-01-29 DIAGNOSIS — E785 Hyperlipidemia, unspecified: Secondary | ICD-10-CM | POA: Diagnosis not present

## 2019-01-29 DIAGNOSIS — F329 Major depressive disorder, single episode, unspecified: Secondary | ICD-10-CM

## 2019-01-29 DIAGNOSIS — Z23 Encounter for immunization: Secondary | ICD-10-CM

## 2019-01-29 DIAGNOSIS — R7303 Prediabetes: Secondary | ICD-10-CM | POA: Diagnosis not present

## 2019-01-29 DIAGNOSIS — M79671 Pain in right foot: Secondary | ICD-10-CM

## 2019-01-29 DIAGNOSIS — S90851A Superficial foreign body, right foot, initial encounter: Secondary | ICD-10-CM

## 2019-01-29 DIAGNOSIS — W25XXXA Contact with sharp glass, initial encounter: Secondary | ICD-10-CM

## 2019-01-29 DIAGNOSIS — M5090 Cervical disc disorder, unspecified, unspecified cervical region: Secondary | ICD-10-CM

## 2019-01-29 DIAGNOSIS — G479 Sleep disorder, unspecified: Secondary | ICD-10-CM

## 2019-01-29 MED ORDER — MUPIROCIN 2 % EX OINT: 1.0000 "application " | TOPICAL_OINTMENT | Freq: Two times a day (BID) | CUTANEOUS | 0 refills | Status: DC

## 2019-01-29 MED ORDER — MUPIROCIN CALCIUM 2 % EX CREA: 1.0000 "application " | TOPICAL_CREAM | Freq: Two times a day (BID) | CUTANEOUS | 0 refills | Status: DC

## 2019-01-29 MED ORDER — LIDOCAINE HCL URETHRAL/MUCOSAL 2 % EX GEL: 1.0000 "application " | CUTANEOUS | 0 refills | Status: DC | PRN

## 2019-01-29 NOTE — ED Triage Notes (Signed)
Pt stepped on something last night and is still in foot on right side

## 2019-01-29 NOTE — Discharge Instructions (Addendum)
X-rays did not show retained foreign body Foreign body removal attempted.  Small fragment of glass removed Perform warm soaks at home Keep covered Apply bactroban to help prevent secondary infection Apply lidocaine jelly as needed for pain Follow up with PCP if symptoms persist Return or go to the ER if you have any new or worsening symptoms (fever, chills, increased swelling, redness, pain, discharge, bleeding, etc...)

## 2019-01-29 NOTE — Patient Instructions (Addendum)
F/U in 6  months, with MD, call if you need me before  Fasting cBC, lipid, cmp and EGFr and HBA1C 2 weeks before next visit  Flu vaccine today  Think about what you will eat, plan ahead. Choose " clean, green, fresh or frozen" over canned, processed or packaged foods which are more sugary, salty and fatty. 70 to 75% of food eaten should be vegetables and fruit. Three meals at set times with snacks allowed between meals, but they must be fruit or vegetables. Aim to eat over a 12 hour period , example 7 am to 7 pm, and STOP after  your last meal of the day. Drink water,generally about 64 ounces per day, no other drink is as healthy. Fruit juice is best enjoyed in a healthy way, by EATING the fruit. It is important that you exercise regularly at least 30 minutes 5 times a week. If you develop chest pain, have severe difficulty breathing, or feel very tired, stop exercising immediately and seek medical attention    Weight loss goal of 7pounds  Please contact Dr Ace Gins re throbbing pain in right intermittently

## 2019-01-29 NOTE — ED Provider Notes (Signed)
[place]   [id number] 01/29/19 Arrival Time: [id number]  CC:Foot PAIN  SUBJECTIVE: History from: patient. SHARMIKA PARODY is a 58 y.o. female complains of right foot pain that began last night.  Symptoms began after she stepped on a piece of glass in the kitchen.  Tried removing the piece of glass at home, but was unable to.  Localizes the pain to the bottom on right foot.  Describes the pain as constant and "annoying" in character.  Symptoms are made worse with walking.  Denies similar symptoms in the past.  Denies fever, chills, erythema, ecchymosis, effusion, weakness, numbness and tingling.   Patient works as a Investment banker, corporate   ROS: As per HPI.  All other pertinent ROS negative.     Past Medical History:  Diagnosis Date  . Abnormal uterine bleeding   . Chronic leukopenia 08/17/2016   WBC = 3.5  . DDD (degenerative disc disease), cervical   . Dyslipidemia   . Elevated serum creatinine 08/17/2016  . GERD (gastroesophageal reflux disease)   . History of esophageal dilatation    2013  . History of ovarian cyst   . Hypothyroidism   . Lactose intolerance   . OSA on CPAP    study done 04-08-2013  . Pre-diabetes   . Right thyroid nodule   . SUI (stress urinary incontinence, female)   . Wears glasses    Past Surgical History:  Procedure Laterality Date  . CESAREAN SECTION  1997   Twins-1 twin del.by c/s, 1 del.vaginally  . COLONOSCOPY  04/04/2011   Internal hemorrhoids/Nl colonoscopy-SLIGHTLY TORTUOUS COLON  . DILATATION & CURETTAGE/HYSTEROSCOPY WITH MYOSURE N/A 08/24/2016   Procedure: DILATATION & CURETTAGE/HYSTEROSCOPY WITH MYOSURE Possible Myosure ;  Surgeon: Nunzio Cobbs, MD;  Location: [facility];  Service: Gynecology;  Laterality: N/A;  . DILATION AND CURETTAGE OF UTERUS  1998   Seconday to miscarriage  . ESOPHAGOGASTRODUODENOSCOPY  09/22/2011   Mild gastritis/Hiatal hernia/DYSPHAGIA DUE TO PEPTIC STRICTURE/UNCONTROLLED GERD  . KNEE ARTHROSCOPY Right  2003  . MALONEY DILATION  09/22/2011   Procedure: MALONEY DILATION;  Surgeon: Danie Binder, MD;  Location: [facility];  Service: Endoscopy;  Laterality: N/A;  . SAVORY DILATION  09/22/2011   Procedure: SAVORY DILATION;  Surgeon: Danie Binder, MD;  Location: [facility];  Service: Endoscopy;  Laterality: N/A;   Allergies  Allergen Reactions  . Lactose Intolerance (Gi)     Vomiting, diarrhea, bloating   No current facility-administered medications on file prior to encounter.    Current Outpatient Medications on File Prior to Encounter  Medication Sig Dispense Refill  . levothyroxine (SYNTHROID) 137 MCG tablet Take 1 tablet (137 mcg total) by mouth daily before breakfast. 90 tablet 1  . Multiple Vitamin (MULTIVITAMIN) tablet Take 1 tablet by mouth daily.    Marland Kitchen omeprazole (PRILOSEC) 40 MG capsule Take 40 mg by mouth daily.    . vitamin C (ASCORBIC ACID) 500 MG tablet Take 500 mg by mouth daily.     Social History   Socioeconomic History  . Marital status: Married    Spouse name: Not on file  . Number of children: Not on file  . Years of education: Not on file  . Highest education level: Not on file  Occupational History  . Occupation: Full time: Architectural technologist  Social Needs  . Financial resource strain: Not on file  . Food insecurity    Worry: Not on file    Inability: Not on file  .  Transportation needs    Medical: Not on file    Non-medical: Not on file  Tobacco Use  . Smoking status: Never Smoker  . Smokeless tobacco: Never Used  Substance and Sexual Activity  . Alcohol use: No  . Drug use: No  . Sexual activity: Yes    Partners: Male    Birth control/protection: Other-see comments    Comment: husband with vasectomy  Lifestyle  . Physical activity    Days per week: Not on file    Minutes per session: Not on file  . Stress: Not on file  Relationships  . Social Herbalist on phone: Not on file    Gets together: Not on file    Attends religious service:  Not on file    Active member of club or organization: Not on file    Attends meetings of clubs or organizations: Not on file    Relationship status: Not on file  . Intimate partner violence    Fear of current or ex partner: Not on file    Emotionally abused: Not on file    Physically abused: Not on file    Forced sexual activity: Not on file  Other Topics Concern  . Not on file  Social History Narrative   Married to [name], neurology   Regular exercise: No   Family History  Problem Relation Age of Onset  . Hypertension Mother   . Angina Mother        MVP. h/o intestinal polyps possibly  . Hyperlipidemia Mother   . Transient ischemic attack Mother   . Hypertension Father   . Hyperlipidemia Father        glucose intolerant  . Diabetes Father   . GER disease Brother   . Diabetes Brother   . Diabetes Maternal Grandfather   . Hyperlipidemia Paternal Grandmother   . Thyroid disease Paternal Grandmother   . Hyperlipidemia Paternal Grandfather   . Thyroid disease Brother        hyperthyroid  . Colon cancer Neg Hx   . Breast cancer Neg Hx   . Ovarian cancer Neg Hx   . Anesthesia problems Neg Hx     OBJECTIVE:  Vitals:   01/29/19 1640  BP: 133/76  Pulse: 62  Resp: 20  Temp: 97.6 F (36.4 C)  SpO2: 96%    General appearance: ALERT; in no acute distress.  Head: NCAT Lungs: Normal respiratory effort CV: Cap refill < 2 seconds Skin: warm and dry; small punctate wound to plantar aspect of RT foot over 3-4th MTJ; TTP, no obvious drainage, bleeding or FB felt Neurologic: Ambulates without difficulty; Sensation intact about the upper/ lower extremities Psychological: alert and cooperative; normal mood and affect  DIAGNOSTIC STUDIES:  Dg Foot Complete Right  Result Date: 01/29/2019 CLINICAL DATA:  Evaluate for foreign body. Stepped on glass yesterday. EXAM: RIGHT FOOT COMPLETE - 3+ VIEW COMPARISON:  None. FINDINGS: The right foot demonstrates no fracture or  dislocation. There is mild osteoarthritis of the first MTP joint. There is a small plantar calcaneal spur. There are mild enthesopathic changes of the Achilles tendon insertion. There is no soft tissue abnormality. There is no subcutaneous emphysema or radiopaque foreign bodies. IMPRESSION: No acute osseous injury of the right foot. No radiopaque foreign body. Electronically Signed   By: Kathreen Devoid   On: 01/29/2019 17:11    X-rays negative for bony abnormalities including fracture, or dislocation.  No soft tissue swelling.    I  have reviewed the x-rays myself and the radiologist interpretation. I am in agreement with the radiologist interpretation.     PROCEDURE: The area over punctate wound cleaned with alcohol swab.  LET applied to achieve topical anesthetic.  Using a 11 blade scalpel the skin over wound was debrided.  A small piece of glass removed with forceps, less than 1 mm.  Bandage applied.  Will look for signs of infection and follow up as needed.    ASSESSMENT & PLAN:  1. Foreign body in right foot, initial encounter   2. Right foot pain     Meds ordered this encounter  Medications  . mupirocin cream (BACTROBAN) 2 %    Sig: Apply 1 application topically 2 (two) times daily.    Dispense:  15 g    Refill:  0    Order Specific Question:   Supervising Provider    Answer:   Raylene Everts [id number]  . lidocaine (XYLOCAINE) 2 % jelly    Sig: Apply 1 application topically as needed.    Dispense:  30 mL    Refill:  0    Order Specific Question:   Supervising Provider    Answer:   Raylene Everts [id number]   X-rays did not show retained foreign body Foreign body removal attempted.  Small fragment of glass removed Perform warm soaks at home Keep covered Apply bactroban to help prevent secondary infection Apply lidocaine jelly as needed for pain Follow up with PCP if symptoms persist Return or go to the ER if you have any new or worsening symptoms (fever, chills,  increased swelling, redness, pain, discharge, bleeding, etc...)   Reviewed expectations re: course of current medical issues. Questions answered. Outlined signs and symptoms indicating need for more acute intervention. Patient verbalized understanding. After Visit Summary given.    Lestine Box, PA-C 01/30/19 1103

## 2019-01-30 ENCOUNTER — Ambulatory Visit: Payer: 59 | Admitting: Family Medicine

## 2019-02-02 ENCOUNTER — Encounter: Payer: Self-pay | Admitting: Family Medicine

## 2019-02-02 NOTE — Assessment & Plan Note (Signed)
Improved , reports speaking with a Retail banker

## 2019-02-02 NOTE — Assessment & Plan Note (Signed)
Treated by Endo will follow up

## 2019-02-02 NOTE — Progress Notes (Signed)
Stacy [name]     MRN: [id number]      DOB: 06-01-1960   HPI Stacy [name] is here for follow up and re-evaluation of chronic medical conditions, medication management and review of any available recent lab and radiology data.  Preventive health is updated, specifically  Cancer screening and Immunization.   Questions or concerns regarding consultations or procedures which the PT has had in the interim are  addressed. The PT denies any adverse reactions to current medications since the last visit.  Right anterior thigh pain for past several months, will discuss with her pain Specialist, has upcoming MRI of C spine Right foot pain x 2 days, stepped on glass the day before and feels as though there is retained glass in the area , wants it removed   ROS Denies recent fever or chills. Denies sinus pressure, nasal congestion, ear pain or sore throat. Denies chest congestion, productive cough or wheezing. Denies chest pains, palpitations and leg swelling Denies abdominal pain, nausea, vomiting,diarrhea or constipation.   Denies dysuria, frequency, hesitancy or incontinence. Denies headaches, seizures, numbness, or tingling. Denies depression, anxiety or insomnia. Denies skin break down or rash.   PE  BP 120/78   Pulse 77   Temp 98.3 F (36.8 C) (Temporal)   Resp 15   Ht 5\' 7"  (1.702 m)   Wt 187 lb (84.8 kg)   SpO2 96%   BMI 29.29 kg/m   Patient alert and oriented and in no cardiopulmonary distress.  HEENT: No facial asymmetry, EOMI,     Neck decreased ROM  Chest: Clear to auscultation bilaterally.  CVS: S1, S2 no murmurs, no S3.Regular rate.  ABD: Soft non tender.   Ext: No edema  MS: Adequate ROM spine, shoulders, hips and knees.  Skin: Intact, no ulcerations or rash noted.[place] foreign body on sole of right foot  Psych: Good eye contact, normal affect. Memory intact not anxious or depressed appearing.  CNS: CN 2-12 intact, power,  normal throughout.no focal  deficits noted.   Assessment & Plan  Foreign body in right foot 2 day h/o foreign body in right foot refer to urgent care for eval an management  Dyslipidemia Hyperlipidemia:Low fat diet discussed and encouraged.   Lipid Panel  Lab Results  Component Value Date   CHOL 201 (H) 11/05/2018   HDL 48 (L) 11/05/2018   LDLCALC 128 (H) 11/05/2018   TRIG 133 11/05/2018   CHOLHDL 4.2 11/05/2018   Updated lab needed at/ before next visit.     Primary hypothyroidism Treated by Endo will follow up  Overweight (BMI 25.0-29.9)  Patient re-educated about  the importance of commitment to a  minimum of 150 minutes of exercise per week as able.  The importance of healthy food choices with portion control discussed, as well as eating regularly and within a 12 hour window most days. The need to choose "clean , green" food 50 to 75% of the time is discussed, as well as to make water the primary drink and set a goal of 64 ounces water daily.    Weight /BMI 01/29/2019 11/18/2018 08/22/2018  WEIGHT 187 lb 184 lb 182 lb  HEIGHT 5\' 7"  5\' 7"  5\' 7"   BMI 29.29 kg/m2 28.82 kg/m2 28.51 kg/m2      Cervical back pain with evidence of disc disease Chronic and unchanged, has MRI C spine pending, managed by Pain Specialist  Sleep disorder Encouraged regular CPAP use  Prediabetes Patient educated about the importance of limiting  Carbohydrate  intake , the need to commit to daily physical activity for a minimum of 30 minutes , and to commit weight loss. The fact that changes in all these areas will reduce or eliminate all together the development of diabetes is stressed.  Improved, npot taking metformin Updated lab needed at/ before next visit.  Diabetic Labs Latest Ref Rng & Units 11/05/2018 04/22/2018 04/03/2017 08/17/2016 03/09/2015  HbA1c <5.7 % of total Hgb 5.8(H) 6.0(H) 5.9(H) 5.8(H) 5.7(H)  Chol <200 mg/dL 201(H) - 209(H) - 134  HDL > OR = 50 mg/dL 48(L) - 49(L) - 46  Calc LDL mg/dL (calc)  128(H) - 131(H) - 69  Triglycerides <150 mg/dL 133 - 156(H) - 93  Creatinine 0.50 - 1.05 mg/dL 1.11(H) - - 1.03(H) 0.87   BP/Weight 01/29/2019 01/29/2019 11/18/2018 08/22/2018 05/21/2018 04/23/2018 [id number]  Systolic BP Q000111Q 123456 123456 0000000 123456 [id number] Q000111Q  Diastolic BP 76 78 70 78 72 75 75  Wt. (Lbs) - 187 184 182 181.08 187 -  BMI - 29.29 28.82 28.51 28.36 30.18 -   Foot/eye exam completion dates Latest Ref Rng & Units 12/09/2013  Eye Exam No Retinopathy No Retinopathy  Foot Form Completion - -      Depression, reactive Improved , reports speaking with a counsellor

## 2019-02-02 NOTE — Assessment & Plan Note (Signed)
2 day h/o foreign body in right foot refer to urgent care for eval an management

## 2019-02-02 NOTE — Assessment & Plan Note (Signed)
Patient educated about the importance of limiting  Carbohydrate intake , the need to commit to daily physical activity for a minimum of 30 minutes , and to commit weight loss. The fact that changes in all these areas will reduce or eliminate all together the development of diabetes is stressed.  Improved, npot taking metformin Updated lab needed at/ before next visit.  Diabetic Labs Latest Ref Rng & Units 11/05/2018 04/22/2018 04/03/2017 08/17/2016 03/09/2015  HbA1c <5.7 % of total Hgb 5.8(H) 6.0(H) 5.9(H) 5.8(H) 5.7(H)  Chol <200 mg/dL 201(H) - 209(H) - 134  HDL > OR = 50 mg/dL 48(L) - 49(L) - 46  Calc LDL mg/dL (calc) 128(H) - 131(H) - 69  Triglycerides <150 mg/dL 133 - 156(H) - 93  Creatinine 0.50 - 1.05 mg/dL 1.11(H) - - 1.03(H) 0.87   BP/Weight 01/29/2019 01/29/2019 11/18/2018 08/22/2018 05/21/2018 04/23/2018 [id number]  Systolic BP Q000111Q 123456 123456 0000000 123456 [id number] Q000111Q  Diastolic BP 76 78 70 78 72 75 75  Wt. (Lbs) - 187 184 182 181.08 187 -  BMI - 29.29 28.82 28.51 28.36 30.18 -   Foot/eye exam completion dates Latest Ref Rng & Units 12/09/2013  Eye Exam No Retinopathy No Retinopathy  Foot Form Completion - -

## 2019-02-02 NOTE — Assessment & Plan Note (Signed)
Encouraged regular CPAP use

## 2019-02-02 NOTE — Assessment & Plan Note (Signed)
Hyperlipidemia:Low fat diet discussed and encouraged.   Lipid Panel  Lab Results  Component Value Date   CHOL 201 (H) 11/05/2018   HDL 48 (L) 11/05/2018   LDLCALC 128 (H) 11/05/2018   TRIG 133 11/05/2018   CHOLHDL 4.2 11/05/2018   Updated lab needed at/ before next visit.

## 2019-02-02 NOTE — Assessment & Plan Note (Signed)
Chronic and unchanged, has MRI C spine pending, managed by Pain Specialist

## 2019-02-02 NOTE — Assessment & Plan Note (Signed)
  Patient re-educated about  the importance of commitment to a  minimum of 150 minutes of exercise per week as able.  The importance of healthy food choices with portion control discussed, as well as eating regularly and within a 12 hour window most days. The need to choose "clean , green" food 50 to 75% of the time is discussed, as well as to make water the primary drink and set a goal of 64 ounces water daily.    Weight /BMI 01/29/2019 11/18/2018 08/22/2018  WEIGHT 187 lb 184 lb 182 lb  HEIGHT 5\' 7"  5\' 7"  5\' 7"   BMI 29.29 kg/m2 28.82 kg/m2 28.51 kg/m2

## 2019-02-11 ENCOUNTER — Encounter: Payer: Self-pay | Admitting: Gastroenterology

## 2019-03-24 ENCOUNTER — Encounter: Payer: Self-pay | Admitting: Gastroenterology

## 2019-04-03 ENCOUNTER — Ambulatory Visit: Payer: 59 | Admitting: Gastroenterology

## 2019-05-08 ENCOUNTER — Ambulatory Visit (HOSPITAL_COMMUNITY): Payer: 59 | Attending: "Endocrinology

## 2019-05-14 ENCOUNTER — Ambulatory Visit: Payer: 59 | Admitting: Gastroenterology

## 2019-05-16 ENCOUNTER — Telehealth: Payer: Self-pay | Admitting: Obstetrics and Gynecology

## 2019-05-16 NOTE — Telephone Encounter (Signed)
Left message to call Foye Damron, RN at [facility] [phone].   

## 2019-05-16 NOTE — Telephone Encounter (Signed)
Spoke with patient. Patient reports mid lower right sided back and pelvic pain that started 10-14 days ago. "Feels like my ovaries". Pain currently 5/10, motrin prn provides relief. Denies vaginal odor, d/c, bleeding, N/V, fever/chills. Reports regular BMs. Patient is post menopausal. Has tried to change diet, no changes. Received injection a few weeks ago for upper back pain. Patient is getting worse, requesting OV. Offered OV today with covering provider at 10:30am, patient declined due to her work schedule. Patient has specific times and days that she request due to her work schedule. OV scheduled for 1/26 at 4:30pm with Dr. Quincy Simmonds. Advised patient if symptoms worsen or new symptoms develop, ER/Urgent Care for further evaluation. Advised patient she is also Overdue for AEX, will further discuss scheduling while in office. Will forward for provider to review, will return call if any additional recommendations.   Last AEX 07/14/16  Routing to covering provider for final review. Patient is agreeable to disposition. Will close encounter.  Cc: Dr. Quincy Simmonds

## 2019-05-16 NOTE — Telephone Encounter (Signed)
Patient is having pain with cyst. Last seen 09/14/16

## 2019-05-19 ENCOUNTER — Encounter: Payer: Self-pay | Admitting: Obstetrics and Gynecology

## 2019-05-19 ENCOUNTER — Other Ambulatory Visit: Payer: Self-pay

## 2019-05-19 ENCOUNTER — Ambulatory Visit (INDEPENDENT_AMBULATORY_CARE_PROVIDER_SITE_OTHER): Payer: 59 | Admitting: Obstetrics and Gynecology

## 2019-05-19 VITALS — BP 138/84 | HR 60 | Temp 96.2°F | Ht 66.0 in | Wt 190.0 lb

## 2019-05-19 DIAGNOSIS — K5900 Constipation, unspecified: Secondary | ICD-10-CM

## 2019-05-19 DIAGNOSIS — R109 Unspecified abdominal pain: Secondary | ICD-10-CM | POA: Diagnosis not present

## 2019-05-19 DIAGNOSIS — R1031 Right lower quadrant pain: Secondary | ICD-10-CM

## 2019-05-19 DIAGNOSIS — R102 Pelvic and perineal pain: Secondary | ICD-10-CM

## 2019-05-19 LAB — POCT URINALYSIS DIPSTICK
Bilirubin, UA: NEGATIVE
Blood, UA: NEGATIVE
Glucose, UA: NEGATIVE
Ketones, UA: NEGATIVE
Leukocytes, UA: NEGATIVE
Nitrite, UA: NEGATIVE
Protein, UA: NEGATIVE
Urobilinogen, UA: 0.2 E.U./dL
pH, UA: 8 (ref 5.0–8.0)

## 2019-05-19 NOTE — Progress Notes (Signed)
GYNECOLOGY  VISIT   HPI: 59 y.o.   Married  [place] American  female   [phone] with Patient's last menstrual period was 09/20/2016 (approximate).   here for RLQ pain and right flank pain x2 weeks. Patient denies any urinary symptoms or change in bowel habits.  She wants to pay attention to her health and understand if she has any problems occurring.   Pain is gone now.  She was having back pain.  After she received an injection for her usually neck/back pain, she identified that she was really having flank pain.  Eating and drinking has not affected this.  Motrin 800 mg did help the pain.   No nausea, vomiting, diarrhea, fever.  Has constipation.   No dysuria, blood in urine, or granules in her urine.  No vaginal bleeding.  No painful intercourse. No unusual vaginal discharge.   Non unusual lifting.   She will see her GI on 06/06/19.  Urine Dip:Neg  GYNECOLOGIC HISTORY: Patient's last menstrual period was 09/20/2016 (approximate). Contraception: Vasectomy Menopausal hormone therapy:  none Last mammogram: 04-29-18 3D/Neg/density C/BiRads1 Last pap smear: 07-14-16 Neg:Neg HR HPV--endometrial cells seen;2016 neg per patient         OB History    Gravida  4   Para  3   Term  3   Preterm      AB  1   Living  4     SAB  1   TAB      Ectopic      Multiple  1   Live Births  4              Patient Active Problem List   Diagnosis Date Noted  . Foreign body in right foot 01/29/2019  . Elevated serum creatinine 08/26/2018  . Dyspepsia 06/03/2018  . Depression, reactive 06/03/2018  . Vitamin D deficiency 04/05/2017  . Primary hypothyroidism 03/11/2015  . 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 Stress and adjustment reaction 03/07/2015  . Polymenorrhea 08/13/2014  . Pain in joint, ankle and foot 08/13/2014  . Metabolic syndrome X [id number]  . Sleep disorder 02/01/2013  . Prediabetes 01/30/2013  . OA (osteoarthritis) of knee 10/03/2012  . Cervical  back pain with evidence of disc disease 09/24/2012  . Hypothyroidism 09/21/2011  . ABNORMAL ELECTROCARDIOGRAM 03/04/2010  . Anemia 09/15/2009  . LEUKOPENIA, CHRONIC 09/15/2009  . Overweight (BMI 25.0-29.9) 04/24/2009  . Dyslipidemia 05/23/2007  . GERD 05/23/2007    Past Medical History:  Diagnosis Date  . Abnormal uterine bleeding   . Chronic leukopenia 08/17/2016   WBC = 3.5  . DDD (degenerative disc disease), cervical   . Dyslipidemia   . Elevated serum creatinine 08/17/2016  . GERD (gastroesophageal reflux disease)   . History of esophageal dilatation    2013  . History of ovarian cyst   . Hypothyroidism   . Lactose intolerance   . OSA on CPAP    study done 04-08-2013  . Pre-diabetes   . Right thyroid nodule   . SUI (stress urinary incontinence, female)   . Tonsillar exudate 08/13/2014  . Wears glasses     Past Surgical History:  Procedure Laterality Date  . CESAREAN SECTION  1997   Twins-1 twin del.by c/s, 1 del.vaginally  . COLONOSCOPY  04/04/2011   Internal hemorrhoids/Nl colonoscopy-SLIGHTLY TORTUOUS COLON  . DILATATION & CURETTAGE/HYSTEROSCOPY WITH MYOSURE N/A 08/24/2016   Procedure: DILATATION & CURETTAGE/HYSTEROSCOPY WITH MYOSURE Possible Myosure ;  Surgeon: Nunzio Cobbs, MD;  Location:  [facility];  Service: Gynecology;  Laterality: N/A;  . DILATION AND CURETTAGE OF UTERUS  1998   Seconday to miscarriage  . ESOPHAGOGASTRODUODENOSCOPY  09/22/2011   Mild gastritis/Hiatal hernia/DYSPHAGIA DUE TO PEPTIC STRICTURE/UNCONTROLLED GERD  . KNEE ARTHROSCOPY Right 2003  . MALONEY DILATION  09/22/2011   Procedure: MALONEY DILATION;  Surgeon: Danie Binder, MD;  Location: [facility];  Service: Endoscopy;  Laterality: N/A;  . SAVORY DILATION  09/22/2011   Procedure: SAVORY DILATION;  Surgeon: Danie Binder, MD;  Location: [facility];  Service: Endoscopy;  Laterality: N/A;    Current Outpatient Medications  Medication Sig Dispense Refill  . levothyroxine  (SYNTHROID) 137 MCG tablet Take 1 tablet (137 mcg total) by mouth daily before breakfast. 90 tablet 1  . Multiple Vitamin (MULTIVITAMIN) tablet Take 1 tablet by mouth daily.    Marland Kitchen omeprazole (PRILOSEC) 40 MG capsule Take 40 mg by mouth daily.    . vitamin C (ASCORBIC ACID) 500 MG tablet Take 500 mg by mouth daily.     No current facility-administered medications for this visit.     ALLERGIES: Lactose intolerance (gi)  Family History  Problem Relation Age of Onset  . Hypertension Mother   . Angina Mother        MVP. h/o intestinal polyps possibly  . Hyperlipidemia Mother   . Transient ischemic attack Mother   . Hypertension Father   . Hyperlipidemia Father        glucose intolerant  . Diabetes Father   . GER disease Brother   . Diabetes Brother   . Diabetes Maternal Grandfather   . Hyperlipidemia Paternal Grandmother   . Thyroid disease Paternal Grandmother   . Hyperlipidemia Paternal Grandfather   . Thyroid disease Brother        hyperthyroid  . Colon cancer Neg Hx   . Breast cancer Neg Hx   . Ovarian cancer Neg Hx   . Anesthesia problems Neg Hx     Social History   Socioeconomic History  . Marital status: Married    Spouse name: Not on file  . Number of children: Not on file  . Years of education: Not on file  . Highest education level: Not on file  Occupational History  . Occupation: Full time: [employer]  Tobacco Use  . Smoking status: Never Smoker  . Smokeless tobacco: Never Used  Substance and Sexual Activity  . Alcohol use: No  . Drug use: No  . Sexual activity: Yes    Partners: Male    Birth control/protection: Other-see comments    Comment: husband with vasectomy  Other Topics Concern  . Not on file  Social History Narrative   Married to [name], neurology   Regular exercise: No   Social Determinants of Health   Financial Resource Strain:   . Difficulty of Paying Living Expenses: Not on file  Food Insecurity:   . Worried About Sales executive in the Last Year: Not on file  . Ran Out of Food in the Last Year: Not on file  Transportation Needs:   . Lack of Transportation (Medical): Not on file  . Lack of Transportation (Non-Medical): Not on file  Physical Activity:   . Days of Exercise per Week: Not on file  . Minutes of Exercise per Session: Not on file  Stress:   . Feeling of Stress : Not on file  Social Connections:   . Frequency of Communication with Friends and Family: Not on file  .  Frequency of Social Gatherings with Friends and Family: Not on file  . Attends Religious Services: Not on file  . Active Member of Clubs or Organizations: Not on file  . Attends Archivist Meetings: Not on file  . Marital Status: Not on file  Intimate Partner Violence:   . Fear of Current or Ex-Partner: Not on file  . Emotionally Abused: Not on file  . Physically Abused: Not on file  . Sexually Abused: Not on file    Review of Systems  Genitourinary: Positive for flank pain (right x 2 weeks) and pelvic pain (RLQ x2 weeks).  All other systems reviewed and are negative.   PHYSICAL EXAMINATION:    BP 138/84 (Cuff Size: Large)   Pulse 60   Temp (!) 96.2 F (35.7 C) (Temporal)   Ht 5\' 6"  (1.676 m)   Wt 190 lb (86.2 kg)   LMP 09/20/2016 (Approximate)   BMI 30.67 kg/m     General appearance: alert, cooperative and appears stated age Head: Normocephalic, without obvious abnormality, atraumatic Neck: no adenopathy, supple, symmetrical, trachea midline and thyroid normal to inspection and palpation Lungs: clear to auscultation bilaterally Heart: regular rate and rhythm Abdomen: soft, non-tender, no masses,  no organomegaly Back:  No CVA tenderness. Extremities: extremities normal, atraumatic, no cyanosis or edema Skin: Skin color, texture, turgor normal. No rashes or lesions No abnormal inguinal nodes palpated Neurologic: Grossly normal  Pelvic: External genitalia:  no lesions              Urethra:  normal  appearing urethra with no masses, tenderness or lesions              Bartholins and Skenes: normal                 Vagina: normal appearing vagina with normal color and discharge, no lesions              Cervix: no lesions                Bimanual Exam:  Uterus:  normal size, contour, position, consistency, mobility, non-tender              Adnexa: no mass, fullness, tenderness              Rectal exam: Yes.  .  Confirms.              Anus:  normal sphincter tone, no lesions  Chaperone was present for exam.  ASSESSMENT  RLQ pain.  Right flank pain.  Normal urine.  Constipation.   PLAN  We discussed possible causes for pain - reproductive, urinary, gastrointestinal, and musculoskeletal. Try Senna S or Miralax.  Return for pelvic US.  She will also be following up with her GI.  An After Visit Summary was printed and given to the patient.  __20____ minutes face to face time of which over 50% was spent in counseling.

## 2019-05-20 ENCOUNTER — Ambulatory Visit: Payer: 59 | Admitting: Family Medicine

## 2019-05-28 ENCOUNTER — Telehealth: Payer: Self-pay | Admitting: Obstetrics and Gynecology

## 2019-05-28 NOTE — Telephone Encounter (Signed)
Patient cancelled ultrasound on Thursday because she is having cold symptoms. Please call to reschedule.

## 2019-05-28 NOTE — Telephone Encounter (Signed)
Left message to call Rockie Schnoor, RN at [facility] [phone].   

## 2019-05-29 ENCOUNTER — Other Ambulatory Visit: Payer: Self-pay

## 2019-05-29 ENCOUNTER — Other Ambulatory Visit (HOSPITAL_COMMUNITY): Payer: Self-pay | Admitting: Family Medicine

## 2019-05-29 ENCOUNTER — Encounter: Payer: Self-pay | Admitting: Family Medicine

## 2019-05-29 ENCOUNTER — Ambulatory Visit (INDEPENDENT_AMBULATORY_CARE_PROVIDER_SITE_OTHER): Payer: 59 | Admitting: Family Medicine

## 2019-05-29 ENCOUNTER — Other Ambulatory Visit: Payer: 59 | Admitting: Obstetrics and Gynecology

## 2019-05-29 ENCOUNTER — Other Ambulatory Visit: Payer: 59

## 2019-05-29 DIAGNOSIS — E785 Hyperlipidemia, unspecified: Secondary | ICD-10-CM

## 2019-05-29 DIAGNOSIS — J029 Acute pharyngitis, unspecified: Secondary | ICD-10-CM | POA: Insufficient documentation

## 2019-05-29 DIAGNOSIS — M461 Sacroiliitis, not elsewhere classified: Secondary | ICD-10-CM | POA: Diagnosis not present

## 2019-05-29 DIAGNOSIS — E039 Hypothyroidism, unspecified: Secondary | ICD-10-CM

## 2019-05-29 DIAGNOSIS — R7303 Prediabetes: Secondary | ICD-10-CM

## 2019-05-29 DIAGNOSIS — K219 Gastro-esophageal reflux disease without esophagitis: Secondary | ICD-10-CM

## 2019-05-29 DIAGNOSIS — Z1231 Encounter for screening mammogram for malignant neoplasm of breast: Secondary | ICD-10-CM

## 2019-05-29 DIAGNOSIS — E663 Overweight: Secondary | ICD-10-CM

## 2019-05-29 MED ORDER — IBUPROFEN 600 MG PO TABS: ORAL_TABLET | ORAL | 0 refills | Status: DC

## 2019-05-29 MED ORDER — PREDNISONE 10 MG PO TABS: ORAL_TABLET | ORAL | 0 refills | Status: DC

## 2019-05-29 MED ORDER — PENICILLIN V POTASSIUM 500 MG PO TABS: 500.0000 mg | ORAL_TABLET | Freq: Three times a day (TID) | ORAL | 0 refills | Status: DC

## 2019-05-29 MED ORDER — FLUCONAZOLE 150 MG PO TABS: ORAL_TABLET | ORAL | 0 refills | Status: DC

## 2019-05-29 NOTE — Patient Instructions (Signed)
F/u in 6 month, call if you need me before  Please schedule mammogram at checkpout  You are treated for right sacroiliitis, ibuprofen and prednisone are prescribed for 1 week only, if persists , please contact dr Ace Gins to re evaluate   Penicillin for 10 days and fluconazole are r prescribed for pharyngitis  Fasting labs today  Please schedule you pap which is due in March  It is important that you exercise regularly at least 30 minutes 5 times a week. If you develop chest pain, have severe difficulty breathing, or feel very tired, stop exercising immediately and seek medical attention   Think about what you will eat, plan ahead. Choose " clean, green, fresh or frozen" over canned, processed or packaged foods which are more sugary, salty and fatty. 70 to 75% of food eaten should be vegetables and fruit. Three meals at set times with snacks allowed between meals, but they must be fruit or vegetables. Aim to eat over a 12 hour period , example 7 am to 7 pm, and STOP after  your last meal of the day. Drink water,generally about 64 ounces per day, no other drink is as healthy. Fruit juice is best enjoyed in a healthy way, by EATING the fruit.  Thanks for choosing [facility], we consider it a privelige to serve you.

## 2019-05-29 NOTE — Addendum Note (Signed)
Addended by: Eual Fines on: 05/29/2019 12:02 PM   Modules accepted: Orders

## 2019-05-30 ENCOUNTER — Encounter: Payer: Self-pay | Admitting: Family Medicine

## 2019-05-30 LAB — COMPLETE METABOLIC PANEL WITH GFR
AG Ratio: 1.3 (calc) (ref 1.0–2.5)
ALT: 29 U/L (ref 6–29)
AST: 25 U/L (ref 10–35)
Albumin: 4.1 g/dL (ref 3.6–5.1)
Alkaline phosphatase (APISO): 63 U/L (ref 37–153)
BUN: 12 mg/dL (ref 7–25)
CO2: 29 mmol/L (ref 20–32)
Calcium: 9.2 mg/dL (ref 8.6–10.4)
Chloride: 106 mmol/L (ref 98–110)
Creat: 1.04 mg/dL (ref 0.50–1.05)
GFR, Est African American: 68 mL/min/{1.73_m2} (ref >=60)
GFR, Est Non African American: 59 mL/min/{1.73_m2} — ABNORMAL LOW (ref >=60)
Globulin: 3.1 g/dL (calc) (ref 1.9–3.7)
Glucose, Bld: 80 mg/dL (ref 65–99)
Potassium: 3.7 mmol/L (ref 3.5–5.3)
Sodium: 144 mmol/L (ref 135–146)
Total Bilirubin: 0.5 mg/dL (ref 0.2–1.2)
Total Protein: 7.2 g/dL (ref 6.1–8.1)

## 2019-05-30 LAB — CBC
HCT: 35.2 % (ref 35.0–45.0)
Hemoglobin: 12 g/dL (ref 11.7–15.5)
MCH: 29.7 pg (ref 27.0–33.0)
MCHC: 34.1 g/dL (ref 32.0–36.0)
MCV: 87.1 fL (ref 80.0–100.0)
MPV: 12.7 fL — ABNORMAL HIGH (ref 7.5–12.5)
Platelets: 160 10*3/uL (ref 140–400)
RBC: 4.04 10*6/uL (ref 3.80–5.10)
RDW: 12.5 % (ref 11.0–15.0)
WBC: 3.9 10*3/uL (ref 3.8–10.8)

## 2019-05-30 LAB — LIPID PANEL
Cholesterol: 197 mg/dL (ref <200)
HDL: 45 mg/dL — ABNORMAL LOW (ref >=50)
LDL Cholesterol (Calc): 124 mg/dL (calc) — ABNORMAL HIGH
Non-HDL Cholesterol (Calc): 152 mg/dL (calc) — ABNORMAL HIGH (ref <130)
Total CHOL/HDL Ratio: 4.4 (calc) (ref <5.0)
Triglycerides: 162 mg/dL — ABNORMAL HIGH (ref <150)

## 2019-05-30 LAB — HEMOGLOBIN A1C
Hgb A1c MFr Bld: 6.1 % of total Hgb — ABNORMAL HIGH (ref <5.7)
Mean Plasma Glucose: 128 (calc)
eAG (mmol/L): 7.1 (calc)

## 2019-05-30 LAB — VITAMIN D 25 HYDROXY (VIT D DEFICIENCY, FRACTURES): Vit D, 25-Hydroxy: 29 ng/mL — ABNORMAL LOW (ref 30–100)

## 2019-05-30 LAB — TSH: TSH: 5.07 mIU/L — ABNORMAL HIGH (ref 0.40–4.50)

## 2019-05-30 LAB — T4, FREE: Free T4: 0.9 ng/dL (ref 0.8–1.8)

## 2019-06-02 ENCOUNTER — Other Ambulatory Visit: Payer: Self-pay | Admitting: Family Medicine

## 2019-06-02 NOTE — Progress Notes (Signed)
Stacy [name]     MRN: [id number]      DOB: [birth year]   HPI Stacy [name] is here for follow up and re-evaluation of chronic medical conditions, medication management and review of any available recent lab and radiology data.  Preventive health is updated, specifically  Cancer screening and Immunization.   Currently having PT for chronic neck pain, with some relief. 2 week h/o localized reproducible right hip pain which radiates to front, no aggravating or relieving factor, no radiation to groin or leg, no new weakness or numbness of legs , no new incontinence Painful right neck glands and swollen tonsil on right for past 5 days, no chills or fever noted, however exudate noted The PT denies any adverse reactions to current medications since the last visit.    ROS Denies recent fever or chills. C/o increased  sinus pressure, nasal congestion,  or sore throat. Denies chest congestion, productive cough or wheezing. Denies chest pains, palpitations and leg swelling Denies abdominal pain, nausea, vomiting,diarrhea or constipation.   Denies dysuria, frequency, hesitancy or incontinence. Denies joint pain, swelling and limitation in mobility. Denies  seizures, numbness, or tingling. Denies depression, anxiety or insomnia. Denies skin break down or rash.   PE  BP 122/78 (BP Location: Left Arm, Patient Position: Sitting)   Pulse 78   Temp 98.7 F (37.1 C) (Temporal)   Ht 5\' 7"  (1.702 m)   Wt 189 lb 1.9 oz (85.8 kg)   LMP 09/20/2016 (Approximate)   SpO2 96%   BMI 29.62 kg/m   Patient alert and oriented and in no cardiopulmonary distress.  HEENT: No facial asymmetry, EOMI,     Neck supple .right anterior cervical adenopathy, right tonsil with exudate  Chest: Clear to auscultation bilaterally.  CVS: S1, S2 no murmurs, no S3.Regular rate.  ABD: Soft non tender.   Ext: No edema  MS: Adequate ROM spine, shoulders, hips and knees.Reproducible pain on palpation over right SI  joint Skin: Intact, no ulcerations or rash noted.  Psych: Good eye contact, normal affect. Memory intact not anxious or depressed appearing.  CNS: CN 2-12 intact, power,  normal throughout.no focal deficits noted.   Assessment & Plan  Sacroiliitis ([place]) Short course of ibuprofen and prednisone orally, if persists, return to pain management for injection into joint  Primary hypothyroidism Managed by endo, elevated tSH, endo to address  Pharyngitis Pen V prescribed  Overweight (BMI 25.0-29.9)  Patient re-educated about  the importance of commitment to a  minimum of 150 minutes of exercise per week as able.  The importance of healthy food choices with portion control discussed, as well as eating regularly and within a 12 hour window most days. The need to choose "clean , green" food 50 to 75% of the time is discussed, as well as to make water the primary drink and set a goal of 64 ounces water daily.    Weight /BMI 05/29/2019 05/19/2019 01/29/2019  WEIGHT 189 lb 1.9 oz 190 lb 187 lb  HEIGHT 5\' 7"  5\' 6"  5\' 7"   BMI 29.62 kg/m2 30.67 kg/m2 29.29 kg/m2      GERD Controlled, no change in medication   Dyslipidemia Hyperlipidemia:Low fat diet discussed and encouraged.   Lipid Panel  Lab Results  Component Value Date   CHOL 197 05/29/2019   HDL 45 (L) 05/29/2019   LDLCALC 124 (H) 05/29/2019   TRIG 162 (H) 05/29/2019   CHOLHDL 4.4 05/29/2019   Needs to increase exercise and lower fat intake  Prediabetes Patient educated about the importance of limiting  Carbohydrate intake , the need to commit to daily physical activity for a minimum of 30 minutes , and to commit weight loss. The fact that changes in all these areas will reduce or eliminate all together the development of diabetes is stressed.   Diabetic Labs Latest Ref Rng & Units 05/29/2019 11/05/2018 04/22/2018 04/03/2017 08/17/2016  HbA1c <5.7 % of total Hgb 6.1(H) 5.8(H) 6.0(H) 5.9(H) 5.8(H)  Chol <200 mg/dL 197  201(H) - 209(H) -  HDL > OR = 50 mg/dL 45(L) 48(L) - 49(L) -  Calc LDL mg/dL (calc) 124(H) 128(H) - 131(H) -  Triglycerides <150 mg/dL 162(H) 133 - 156(H) -  Creatinine 0.50 - 1.05 mg/dL 1.04 1.11(H) - - 1.03(H)   BP/Weight 05/29/2019 05/19/2019 01/29/2019 01/29/2019 11/18/2018 08/22/2018 [id number]  Systolic BP 123XX123 0000000 [id number] 123456 123456 0000000 123456  Diastolic BP 78 84 76 78 70 78 72  Wt. (Lbs) 189.12 190 - 187 184 182 181.08  BMI 29.62 30.67 - 29.29 28.82 28.51 28.36   Foot/eye exam completion dates Latest Ref Rng & Units 12/09/2013  Eye Exam No Retinopathy No Retinopathy  Foot Form Completion - -

## 2019-06-02 NOTE — Assessment & Plan Note (Signed)
Hyperlipidemia:Low fat diet discussed and encouraged.   Lipid Panel  Lab Results  Component Value Date   CHOL 197 05/29/2019   HDL 45 (L) 05/29/2019   LDLCALC 124 (H) 05/29/2019   TRIG 162 (H) 05/29/2019   CHOLHDL 4.4 05/29/2019   Needs to increase exercise and lower fat intake

## 2019-06-02 NOTE — Assessment & Plan Note (Signed)
Controlled, no change in medication  

## 2019-06-02 NOTE — Assessment & Plan Note (Signed)
  Patient re-educated about  the importance of commitment to a  minimum of 150 minutes of exercise per week as able.  The importance of healthy food choices with portion control discussed, as well as eating regularly and within a 12 hour window most days. The need to choose "clean , green" food 50 to 75% of the time is discussed, as well as to make water the primary drink and set a goal of 64 ounces water daily.    Weight /BMI 05/29/2019 05/19/2019 01/29/2019  WEIGHT 189 lb 1.9 oz 190 lb 187 lb  HEIGHT 5\' 7"  5\' 6"  5\' 7"   BMI 29.62 kg/m2 30.67 kg/m2 29.29 kg/m2

## 2019-06-02 NOTE — Assessment & Plan Note (Signed)
Patient educated about the importance of limiting  Carbohydrate intake , the need to commit to daily physical activity for a minimum of 30 minutes , and to commit weight loss. The fact that changes in all these areas will reduce or eliminate all together the development of diabetes is stressed.   Diabetic Labs Latest Ref Rng & Units 05/29/2019 11/05/2018 04/22/2018 04/03/2017 08/17/2016  HbA1c <5.7 % of total Hgb 6.1(H) 5.8(H) 6.0(H) 5.9(H) 5.8(H)  Chol <200 mg/dL 197 201(H) - 209(H) -  HDL > OR = 50 mg/dL 45(L) 48(L) - 49(L) -  Calc LDL mg/dL (calc) 124(H) 128(H) - 131(H) -  Triglycerides <150 mg/dL 162(H) 133 - 156(H) -  Creatinine 0.50 - 1.05 mg/dL 1.04 1.11(H) - - 1.03(H)   BP/Weight 05/29/2019 05/19/2019 01/29/2019 01/29/2019 11/18/2018 08/22/2018 [id number]  Systolic BP 123XX123 0000000 [id number] 123456 123456 0000000 123456  Diastolic BP 78 84 76 78 70 78 72  Wt. (Lbs) 189.12 190 - 187 184 182 181.08  BMI 29.62 30.67 - 29.29 28.82 28.51 28.36   Foot/eye exam completion dates Latest Ref Rng & Units 12/09/2013  Eye Exam No Retinopathy No Retinopathy  Foot Form Completion - -

## 2019-06-02 NOTE — Assessment & Plan Note (Signed)
Managed by endo, elevated tSH, endo to address

## 2019-06-02 NOTE — Assessment & Plan Note (Signed)
Short course of ibuprofen and prednisone orally, if persists, return to pain management for injection into joint

## 2019-06-02 NOTE — Assessment & Plan Note (Signed)
Pen V prescribed 

## 2019-06-04 ENCOUNTER — Ambulatory Visit (HOSPITAL_COMMUNITY)
Admission: RE | Admit: 2019-06-04 | Discharge: 2019-06-04 | Disposition: A | Discharge status: Home / Self Care | Payer: 59 | Source: Ambulatory Visit | Attending: Family Medicine | Admitting: Family Medicine

## 2019-06-04 ENCOUNTER — Other Ambulatory Visit: Payer: Self-pay

## 2019-06-04 DIAGNOSIS — M5382 Other specified dorsopathies, cervical region: Secondary | ICD-10-CM | POA: Diagnosis present

## 2019-06-04 DIAGNOSIS — Z1231 Encounter for screening mammogram for malignant neoplasm of breast: Secondary | ICD-10-CM | POA: Diagnosis present

## 2019-06-04 NOTE — Telephone Encounter (Signed)
Spoke with patient. Patient states she was Dx with tonsillitis by PCP, Covid 19 neg, was tx, symptoms have resolved.   Patient declines PUS at this time, patient is requesting to proceed with a Pap smear first. Patient states she believes pain is food related, she is scheduled to see GI on 2/11. Patient request to schedule on a Thursday.  OV scheduled for pap smear on 2/18 at 4:30pm with Dr. Quincy Simmonds. Advised patient I will review with Dr. Quincy Simmonds and return call if any additional recommendations.   Routing to provider for final review. Patient is agreeable to disposition. Will close encounter.

## 2019-06-05 ENCOUNTER — Ambulatory Visit (HOSPITAL_COMMUNITY): Payer: 59

## 2019-06-05 ENCOUNTER — Ambulatory Visit (INDEPENDENT_AMBULATORY_CARE_PROVIDER_SITE_OTHER): Payer: 59 | Admitting: Gastroenterology

## 2019-06-05 ENCOUNTER — Encounter: Payer: Self-pay | Admitting: Gastroenterology

## 2019-06-05 DIAGNOSIS — R1011 Right upper quadrant pain: Secondary | ICD-10-CM | POA: Diagnosis not present

## 2019-06-05 MED ORDER — LIDOCAINE VISCOUS HCL 2 % MT SOLN: OROMUCOSAL | 5 refills | Status: DC

## 2019-06-05 MED ORDER — OMEPRAZOLE 40 MG PO CPDR: DELAYED_RELEASE_CAPSULE | ORAL | 11 refills | Status: DC

## 2019-06-05 MED ORDER — FAMOTIDINE 20 MG PO TABS: 20.0000 mg | ORAL_TABLET | Freq: Two times a day (BID) | ORAL | 3 refills | Status: DC

## 2019-06-05 NOTE — Patient Instructions (Addendum)
EAT TO LIVE AND THINK OF FOOD AS MEDICINE. 75% OF YOUR PLATE SHOULD BE FRUITS/VEGGIES.  To have more energy, and to lose weight:      1. CONTINUE YOUR WEIGHT LOSS EFFORTS. I RECOMMEND YOU READ AND FOLLOW RECOMMENDATIONS BY DR. MARK HYMAN, "10-DAY DETOX DIET".    2. If you must eat bread, EAT EZEKIEL BREAD. IT IS IN THE FROZEN SECTION OF THE GROCERY STORE.    3. DRINK WATER WITH FRUIT OR CUCUMBER ADDED. YOUR URINE SHOULD BE LIGHT YELLOW. AVOID SODA, GATORADE, ENERGY DRINKS, OR DIET SODA.     4. AVOID HIGH FRUCTOSE CORN SYRUP AND CAFFEINE.     5. DO NOT chew SUGAR FREE GUM OR USE ARTIFICIAL SWEETENERS. IF NEEDED USE STEVIA AS A SWEETENER.    6. DO NOT EAT ENRICHED WHEAT FLOUR, PASTA, RICE, OR CEREAL.    7. ONLY EAT WILD CAUGHT SEAFOOD, GRASS FED BEEF OR CHICKEN, PORK FROM PASTURE RAISE PIGS, OR EGGS FROM PASTURE RAISED CHICKENS.    8. PRACTICE CHAIR YOGA FOR 15-30 MINS 3 OR 4 TIMES A WEEK AND PROGRESS TO HATHA YOGA OVER NEXT 6 MOS.    9. START TAKING A MULTIVITAMIN, VITAMIN B12, AND VITAMIN D3 2000 IU DAILY.   FOR THREE MONTHS, TAKE THESES ADDITIONAL SUPPLEMENTS TO DECREASE CRAVING AND SUPPRESS YOUR APPETITE:    1. CINNAMON 500 MG EVERY AM PRIOR TO FIRST MEAL.   **STABILIZES BLOOD GLUCOSE/REDUCES CRAVINGS**    2. CHROMIUM 400-500 MG WITH MEALS TWICE DAILY.    **FAT BURNER**    3. GREEN TEA EXTRACT ONE DAILY.   **FAT BURNER/SUPPRESSES YOUR APPETITE**    4. ALPHA LIPOIC ACID TWICE DAILY.   **NATURAL ANTI-INFLAMMATORY SUPPLEMENT THAT IS AN ALTERNATIVE TO NSAIDs**   START OMEPRAZOLE DAILY FOR 3 MOS THEN USE AS NEEDED.  AN ALTERNATIVE TO ZISPOR INCLUDES TUMERIC AND APLHA LIPOIC ACID.  WHEN RUQ OCCURS, AS AN ALTERNATIVE TO TUMS, WHICH CAN CAUSE CONSTIPATION,  YOU CAN:    1. USE PEPCID OIR TAGAMET UP TO TWICE DAILY OR    2.  USE VISCOUS LIDOCAINE 2 TSP EVERY 4 HOURS WHEN NEEDED FOR FLARES OF HEARTBURN, CHEST PAIN, OR UPPER ABDOMINAL PAIN. USE A SYRINGE TO INJECT INTO THE BACK OF  YOUR THROAT. USE NO MORE THAN 8 DOSES A DAY. IT WILL MAKE YOUR MOUTH, ESOPHAGUS, AND STOMACH NUMB.   COMPLETE CT SCAN WITHIN ONE WEEK.  PLEASE CALL IN 2-3 WEEKS IF YOU'RE STILL HAVING PROBLEMS WITH RIGHT SIDE PAIN.  FOLLOW UP IN 2 MOS.

## 2019-06-05 NOTE — Progress Notes (Signed)
Cc'ed to pcp °

## 2019-06-05 NOTE — Progress Notes (Signed)
Subjective:    Patient ID: Stacy [name], female    DOB: 25-Aug-1960, 58 y.o.   MRN: [id number]  HPI SHARP Pain in right side and thinks may be food related. MAY HAPPEN 30-45 MINS AFTER SHE EATS.Thought it might be a gyn problem. It's all food but sees more often after sweets. MAY LAST 15-20 MINS. WAKES HER UP AT NIGHT. DOESN'T HAPPEN ALL THE TIME AFTER SHE EATS. NOT HAPPENING EVERY DAY AND CAN HAPPEN WITHOUT EATING. TAKING ZISPOR AND TAKING PRILOSEC PLUS TUMS PRN IF THINKS INDIGESTION. HEARTBURN: NOT REALLY ANYMORE. INDIGESTION: BELCHING AND SITS AND BELCHES AND CAN'T STOP. EATS AT NIGHT BELCHES AND CAN BELCH EVEN WHEN SHE DOESN'T EAT. BMs: EVERY DAY IF EAT PROPERLY. MAY HAVE CONSTIPATION IF EATS TOO MUCH SWEETS. SOMETIMES SITS IN CHEST AND DRINKS WATER TO FLUSH IT DOWN. GB SLUDGE IN PAST BUT NO STONES.   PT DENIES FEVER, CHILLS, HEMATOCHEZIA, HEMATEMESIS, nausea, vomiting, melena, diarrhea, CHEST PAIN, SHORTNESS OF BREATH, CHANGE IN BOWEL IN HABITS,  problems swallowing, problems with sedation, heartburn or indigestion.   Past Medical History:  Diagnosis Date  . Abnormal uterine bleeding   . Chronic leukopenia 08/17/2016   WBC = 3.5  . DDD (degenerative disc disease), cervical   . Dyslipidemia   . Elevated serum creatinine 08/17/2016  . GERD (gastroesophageal reflux disease)   . History of esophageal dilatation    2013  . History of ovarian cyst   . Hypothyroidism   . Lactose intolerance   . OSA on CPAP    study done 04-08-2013  . Pre-diabetes   . Right thyroid nodule   . SUI (stress urinary incontinence, female)   . Tonsillar exudate 08/13/2014  . Wears glasses     Past Surgical History:  Procedure Laterality Date  . CESAREAN SECTION  1997   Twins-1 twin del.by c/s, 1 del.vaginally  . COLONOSCOPY  04/04/2011   Internal hemorrhoids/Nl colonoscopy-SLIGHTLY TORTUOUS COLON  . DILATATION & CURETTAGE/HYSTEROSCOPY WITH MYOSURE N/A 08/24/2016   Procedure: DILATATION &  CURETTAGE/HYSTEROSCOPY WITH MYOSURE Possible Myosure ;  Surgeon: Nunzio Cobbs, MD;  Location: [facility];  Service: Gynecology;  Laterality: N/A;  . DILATION AND CURETTAGE OF UTERUS  1998   Seconday to miscarriage  . ESOPHAGOGASTRODUODENOSCOPY  09/22/2011   Mild gastritis/Hiatal hernia/DYSPHAGIA DUE TO PEPTIC STRICTURE/UNCONTROLLED GERD  . KNEE ARTHROSCOPY Right 2003  . MALONEY DILATION  09/22/2011   Procedure: MALONEY DILATION;  Surgeon: Danie Binder, MD;  [facility];  Service: Endoscopy;  Laterality: N/A;  . SAVORY DILATION  09/22/2011   Procedure: SAVORY DILATION;  Surgeon: Danie Binder, MD;  [facility]: [facility];  Service: Endoscopy;  Laterality: N/A;   Allergies  Allergen Reactions  . Lactose Intolerance (Gi)     Vomiting, diarrhea, bloating    Current Outpatient Medications  Medication Sig    . Cholecalciferol (VITAMIN D-3) 125 MCG (5000 UT) TABS Take by mouth daily.    . fluconazole (DIFLUCAN) 150 MG tablet Take one tablet by mouth once daily as needed, for vaginitis    . levothyroxine (SYNTHROID) 137 MCG tablet Take 1 tablet (137 mcg total) by mouth daily before breakfast.    . Multiple Vitamin (MULTIVITAMIN) tablet Take 1 tablet by mouth daily.    Marland Kitchen omeprazole (PRILOSEC) 40 MG capsule Take 40 mg by mouth 3 (three) times a week.     . penicillin v potassium (VEETID) 500 MG tablet Take 1 tablet (500 mg total) by mouth 3 (three)  times daily.    . vitamin C (ASCORBIC ACID) 500 MG tablet Take 500 mg by mouth daily.    Marland Kitchen ZISPOR  2-3X/WEEK   .       Review of Systems PER HPI OTHERWISE ALL SYSTEMS ARE NEGATIVE.    Objective:   Physical Exam Constitutional:      General: She is not in acute distress.    Appearance: Normal appearance.  HENT:     Mouth/Throat:     Comments: MASK IN PLACE Eyes:     General: No scleral icterus.    Pupils: Pupils are equal, round, and reactive to light.  Cardiovascular:     Rate and Rhythm: Normal rate and regular  rhythm.     Pulses: Normal pulses.     Heart sounds: Normal heart sounds.  Pulmonary:     Effort: Pulmonary effort is normal.     Breath sounds: Normal breath sounds.  Abdominal:     General: Bowel sounds are normal.     Palpations: Abdomen is soft.     Tenderness: There is abdominal tenderness. There is no guarding or rebound.     Comments: MILD RUQ/RIGHT FLANK TTP  Musculoskeletal:     Cervical back: Normal range of motion.     Right lower leg: No edema.     Left lower leg: No edema.  Lymphadenopathy:     Cervical: No cervical adenopathy.  Skin:    General: Skin is warm and dry.  Neurological:     Mental Status: She is alert and oriented to person, place, and time.     Comments: NO  NEW FOCAL DEFICITS  Psychiatric:        Mood and Affect: Mood normal.     Comments: NORMAL AFFECT       Assessment & Plan:

## 2019-06-05 NOTE — Assessment & Plan Note (Signed)
SYMPTOMS NOT IDEALLY CONTROLLED. DIFFERENTIAL DIAGNOSIS INCLUDES: NSAID GASTRODUODENITIS, LESS LIKELY  OCCULT MALIGNANCY, PUD, OR CHOLECYSTITIS. REVIEWED LABS ABD [place] 2021.  EAT TO LIVE AND THINK OF FOOD AS MEDICINE. 75% OF YOUR PLATE SHOULD BE FRUITS/VEGGIES.  To have more energy, and to lose weight:      1. CONTINUE YOUR WEIGHT LOSS EFFORTS. I RECOMMEND YOU READ AND FOLLOW RECOMMENDATIONS BY DR. MARK HYMAN, "10-DAY DETOX DIET".    2. If you must eat bread, EAT EZEKIEL BREAD. IT IS IN THE FROZEN SECTION OF THE GROCERY STORE.    3. DRINK WATER WITH FRUIT OR CUCUMBER ADDED. YOUR URINE SHOULD BE LIGHT YELLOW. AVOID SODA, GATORADE, ENERGY DRINKS, OR DIET SODA.     4. AVOID HIGH FRUCTOSE CORN SYRUP AND CAFFEINE.     5. DO NOT chew SUGAR FREE GUM OR USE ARTIFICIAL SWEETENERS. IF NEEDED USE STEVIA AS A SWEETENER.    6. DO NOT EAT ENRICHED WHEAT FLOUR, PASTA, RICE, OR CEREAL.    7. ONLY EAT WILD CAUGHT SEAFOOD, GRASS FED BEEF OR CHICKEN, PORK FROM PASTURE RAISE PIGS, OR EGGS FROM PASTURE RAISED CHICKENS.    8. PRACTICE CHAIR YOGA FOR 15-30 MINS 3 OR 4 TIMES A WEEK AND PROGRESS TO HATHA YOGA OVER NEXT 6 MOS.    9. START TAKING A MULTIVITAMIN, VITAMIN B12, AND VITAMIN D3 2000 IU DAILY.   FOR THREE MONTHS, TAKE THESES ADDITIONAL SUPPLEMENTS TO DECREASE CRAVING AND SUPPRESS YOUR APPETITE:    1. CINNAMON 500 MG EVERY AM PRIOR TO FIRST MEAL.   **STABILIZES BLOOD GLUCOSE/REDUCES CRAVINGS**    2. CHROMIUM 400-500 MG WITH MEALS TWICE DAILY.    **FAT BURNER**    3. GREEN TEA EXTRACT ONE DAILY.   **FAT BURNER/SUPPRESSES YOUR APPETITE**    4. ALPHA LIPOIC ACID TWICE DAILY.   **NATURAL ANTI-INFLAMMATORY SUPPLEMENT THAT IS AN ALTERNATIVE TO NSAIDs**   START OMEPRAZOLE DAILY FOR 3 MOS THEN USE AS NEEDED.  AN ALTERNATIVE TO ZISPOR INCLUDES TUMERIC AND APLHA LIPOIC ACID.  WHEN RUQ OCCURS, AS AN ALTERNATIVE TO TUMS, WHICH CAN CAUSE CONSTIPATION,  YOU CAN:    1. USE PEPCID OIR TAGAMET UP TO TWICE  DAILY OR    2.  USE VISCOUS LIDOCAINE 2 TSP EVERY 4 HOURS WHEN NEEDED FOR FLARES OF HEARTBURN, CHEST PAIN, OR UPPER ABDOMINAL PAIN. USE A SYRINGE TO INJECT INTO THE BACK OF YOUR THROAT. USE NO MORE THAN 8 DOSES A DAY. IT WILL MAKE YOUR MOUTH, ESOPHAGUS, AND STOMACH NUMB.   COMPLETE CT SCAN WITHIN ONE WEEK.  PLEASE CALL IN 2-3 WEEKS IF YOU'RE STILL HAVING PROBLEMS WITH RIGHT SIDE PAIN.  FOLLOW UP IN 2 MOS.

## 2019-06-05 NOTE — Addendum Note (Signed)
Addended by: Danie Binder on: 06/05/2019 12:15 PM   Modules accepted: Orders

## 2019-06-06 ENCOUNTER — Other Ambulatory Visit: Payer: Self-pay

## 2019-06-06 ENCOUNTER — Telehealth: Payer: Self-pay

## 2019-06-06 DIAGNOSIS — R1011 Right upper quadrant pain: Secondary | ICD-10-CM

## 2019-06-06 NOTE — Telephone Encounter (Signed)
MyChart message sent to pt

## 2019-06-06 NOTE — Telephone Encounter (Signed)
PA for CT abd/pelvis w/contrast submitted via [facility] website. [employer] requires pt to have CT at [place]. Case approved. PA# [id number], valid 06/06/19-07/21/19.  Order for CT entered for GI. Called and informed receptionist of ASAP order.  Tried to call pt inform her she will need to have CT at GI, no answer, voicemail box is full.

## 2019-06-09 ENCOUNTER — Other Ambulatory Visit: Payer: Self-pay

## 2019-06-09 ENCOUNTER — Ambulatory Visit (HOSPITAL_COMMUNITY)
Admission: RE | Admit: 2019-06-09 | Discharge: 2019-06-09 | Disposition: A | Discharge status: Home / Self Care | Payer: 59 | Source: Ambulatory Visit | Attending: "Endocrinology | Admitting: "Endocrinology

## 2019-06-09 DIAGNOSIS — E042 Nontoxic multinodular goiter: Secondary | ICD-10-CM | POA: Insufficient documentation

## 2019-06-11 ENCOUNTER — Encounter: Payer: Self-pay | Admitting: Obstetrics and Gynecology

## 2019-06-11 ENCOUNTER — Ambulatory Visit: Payer: 59 | Admitting: "Endocrinology

## 2019-06-11 NOTE — Telephone Encounter (Signed)
Tried to call pt to see if she has heard from GI, no answer, voicemail box is full. Pt hasn't read previous MyChart message.

## 2019-06-11 NOTE — Telephone Encounter (Signed)
Pt called office, gave her phone number for GI to schedule CT.

## 2019-06-12 ENCOUNTER — Encounter: Payer: Self-pay | Admitting: "Endocrinology

## 2019-06-12 ENCOUNTER — Ambulatory Visit (INDEPENDENT_AMBULATORY_CARE_PROVIDER_SITE_OTHER): Payer: 59 | Admitting: "Endocrinology

## 2019-06-12 ENCOUNTER — Other Ambulatory Visit: Payer: Self-pay

## 2019-06-12 ENCOUNTER — Ambulatory Visit: Payer: Self-pay | Admitting: Obstetrics and Gynecology

## 2019-06-12 DIAGNOSIS — R7303 Prediabetes: Secondary | ICD-10-CM

## 2019-06-12 DIAGNOSIS — E039 Hypothyroidism, unspecified: Secondary | ICD-10-CM | POA: Diagnosis not present

## 2019-06-12 DIAGNOSIS — E559 Vitamin D deficiency, unspecified: Secondary | ICD-10-CM

## 2019-06-12 MED ORDER — VITAMIN D-3 125 MCG (5000 UT) PO TABS: 1.0000 | ORAL_TABLET | Freq: Every day | ORAL | 0 refills | Status: DC

## 2019-06-12 MED ORDER — LEVOTHYROXINE SODIUM 137 MCG PO TABS: 137.0000 ug | ORAL_TABLET | Freq: Every day | ORAL | 1 refills | Status: DC

## 2019-06-12 NOTE — Progress Notes (Signed)
06/12/2019                                Endocrinology Telehealth Visit Follow up Note -During COVID -19 Pandemic  I connected with Stacy [name] on 06/12/2019   by telephone and verified that I am speaking with the correct person using two identifiers. Stacy [name], 12/27/1960. she has verbally consented to this visit. All issues noted in this document were discussed and addressed. The format was not optimal for physical exam.   Subjective:    Patient ID: Stacy [name], female    DOB: [birth year],  PCP Fayrene Helper, MD   Past Medical History:  Diagnosis Date  . Abnormal uterine bleeding   . Chronic leukopenia 08/17/2016   WBC = 3.5  . DDD (degenerative disc disease), cervical   . Dyslipidemia   . Elevated serum creatinine 08/17/2016  . GERD (gastroesophageal reflux disease)   . History of esophageal dilatation    2013  . History of ovarian cyst   . Hypothyroidism   . Lactose intolerance   . OSA on CPAP    study done 04-08-2013  . Pre-diabetes   . Right thyroid nodule   . SUI (stress urinary incontinence, female)   . Tonsillar exudate 08/13/2014  . Wears glasses    Past Surgical History:  Procedure Laterality Date  . CESAREAN SECTION  1997   Twins-1 twin del.by c/s, 1 del.vaginally  . COLONOSCOPY  04/04/2011   Internal hemorrhoids/Nl colonoscopy-SLIGHTLY TORTUOUS COLON  . DILATATION & CURETTAGE/HYSTEROSCOPY WITH MYOSURE N/A 08/24/2016   Procedure: DILATATION & CURETTAGE/HYSTEROSCOPY WITH MYOSURE Possible Myosure ;  Surgeon: Nunzio Cobbs, MD;  Location: [facility];  Service: Gynecology;  Laterality: N/A;  . DILATION AND CURETTAGE OF UTERUS  1998   Seconday to miscarriage  . ESOPHAGOGASTRODUODENOSCOPY  09/22/2011   Mild gastritis/Hiatal hernia/DYSPHAGIA DUE TO PEPTIC STRICTURE/UNCONTROLLED GERD  . KNEE ARTHROSCOPY Right 2003  . MALONEY DILATION  09/22/2011   Procedure: MALONEY DILATION;  Surgeon: Danie Binder, MD;  [facility];   Service: Endoscopy;  Laterality: N/A;  . SAVORY DILATION  09/22/2011   Procedure: SAVORY DILATION;  Surgeon: Danie Binder, MD;  [facility];  Service: Endoscopy;  Laterality: N/A;   Social History   Socioeconomic History  . Marital status: Married    Spouse name: Not on file  . Number of children: Not on file  . Years of education: Not on file  . Highest education level: Not on file  Occupational History  . Occupation: Full time: [employer]  Tobacco Use  . Smoking status: Never Smoker  . Smokeless tobacco: Never Used  Substance and Sexual Activity  . Alcohol use: No  . Drug use: No  . Sexual activity: Yes    Partners: Male    Birth control/protection: Other-see comments    Comment: husband with vasectomy  Other Topics Concern  . Not on file  Social History Narrative   Married to [name], neurology   Regular exercise: No   Social Determinants of Health   Financial Resource Strain:   . Difficulty of Paying Living Expenses: Not on file  Food Insecurity:   . Worried About Charity fundraiser in the Last Year: Not on file  . Ran Out of Food in the Last Year: Not on file  Transportation Needs:   . Lack of Transportation (Medical): Not on file  .  Lack of Transportation (Non-Medical): Not on file  Physical Activity:   . Days of Exercise per Week: Not on file  . Minutes of Exercise per Session: Not on file  Stress:   . Feeling of Stress : Not on file  Social Connections:   . Frequency of Communication with Friends and Family: Not on file  . Frequency of Social Gatherings with Friends and Family: Not on file  . Attends Religious Services: Not on file  . Active Member of Clubs or Organizations: Not on file  . Attends Archivist Meetings: Not on file  . Marital Status: Not on file   Outpatient Encounter Medications as of 06/12/2019  Medication Sig  . Cholecalciferol (VITAMIN D-3) 125 MCG (5000 UT) TABS Take 1 capsule by mouth daily.  . famotidine  (PEPCID) 20 MG tablet Take 1 tablet (20 mg total) by mouth 2 (two) times daily.  . fluconazole (DIFLUCAN) 150 MG tablet Take one tablet by mouth once daily as needed, for vaginitis  . ibuprofen (ADVIL) 600 MG tablet TAKE 1 TABLET BY MOUTH THREE TIMES DAILY FOR 1 WEEK (Patient not taking: Reported on 06/05/2019)  . levothyroxine (SYNTHROID) 137 MCG tablet Take 1 tablet (137 mcg total) by mouth daily before breakfast.  . lidocaine (XYLOCAINE) 2 % solution Using a syringe, 10 mL PO q4h or 30 MINS PRIOR TO MEALS AND PRN FOR ABDOMINAL/CHEST PAIN. NO MORE> 8 DOSES/DAY.  . Multiple Vitamin (MULTIVITAMIN) tablet Take 1 tablet by mouth daily.  Marland Kitchen omeprazole (PRILOSEC) 40 MG capsule 1 PO 30 MINS PRIOR TO FIRST MEAL DAILY  . penicillin v potassium (VEETID) 500 MG tablet Take 1 tablet (500 mg total) by mouth 3 (three) times daily.  . predniSONE (DELTASONE) 10 MG tablet Take one tablet by mouth 3 times daily for 2 days, then take one tablet two times daily for 2 days, then take one tablet once daily for 2 days, then stop (Patient not taking: Reported on 06/05/2019)  . vitamin C (ASCORBIC ACID) 500 MG tablet Take 500 mg by mouth daily.  . [DISCONTINUED] Cholecalciferol (VITAMIN D-3) 125 MCG (5000 UT) TABS Take by mouth daily.  . [DISCONTINUED] levothyroxine (SYNTHROID) 137 MCG tablet Take 1 tablet (137 mcg total) by mouth daily before breakfast.   No facility-administered encounter medications on file as of 06/12/2019.   ALLERGIES: Allergies  Allergen Reactions  . Lactose Intolerance (Gi)     Vomiting, diarrhea, bloating   VACCINATION STATUS: Immunization History  Administered Date(s) Administered  . Influenza Split 02/10/2014  . Influenza,inj,Quad PF,6+ Mos 01/30/2013, 01/12/2015, 01/30/2017, 04/22/2018, 01/29/2019  . Tdap 05/09/2012  . Zoster Recombinat (Shingrix) 05/21/2018    HPI  59 yr old female with hypothyroidism from Hashimoto's thyroiditis. She is currently on levothyroxine 137 mcg p.o. daily  in the morning. -She admits to some inconsistency in taking this hormonal therapy.  She has gained 7 pounds since last visit.  She denies tremors, heat intolerance.  Her previsit thyroid function tests are consistent with under replacement. she denies any family hx of thyroid dysfunction. She has a stable  1.0 cm right thyroid lobe nodule which has remained stable for 7 years.   -She is a mother of  4 grown children, with hx of HEELP syndrome during the first pregnancy. -She did not tolerate metformin which was prescribed due to prediabetes, most recent A1c was 6.1%.    -She is on ongoing vitamin D supplement.       Review of Systems Limited as above.  Objective:    LMP 09/20/2016 (Approximate)   Wt Readings from Last 3 Encounters:  06/05/19 192 lb 3.2 oz (87.2 kg)  05/29/19 189 lb 1.9 oz (85.8 kg)  05/19/19 190 lb (86.2 kg)      Results for orders placed or performed in visit on 05/19/19  POCT urinalysis dipstick  Result Value Ref Range   Color, UA yellow    Clarity, UA clear    Glucose, UA Negative Negative   Bilirubin, UA n    Ketones, UA n    Spec Grav, UA     Blood, UA n    pH, UA 8.0 5.0 - 8.0   Protein, UA Negative Negative   Urobilinogen, UA 0.2 0.2 or 1.0 E.U./dL   Nitrite, UA n    Leukocytes, UA Negative Negative   Appearance     Odor     Chemistry (most recent): Lab Results  Component Value Date   NA 144 05/29/2019   K 3.7 05/29/2019   CL 106 05/29/2019   CO2 29 05/29/2019   BUN 12 05/29/2019   CREATININE 1.04 05/29/2019   Diabetic Labs (most recent): Lab Results  Component Value Date   HGBA1C 6.1 (H) 05/29/2019   HGBA1C 5.8 (H) 11/05/2018   HGBA1C 6.0 (H) 04/22/2018   Lipid Panel     Component Value Date/Time   CHOL 197 05/29/2019 1412   TRIG 162 (H) 05/29/2019 1412   HDL 45 (L) 05/29/2019 1412   CHOLHDL 4.4 05/29/2019 1412   VLDL 19 03/09/2015 1509   LDLCALC 124 (H) 05/29/2019 1412     Assessment & Plan:   1) hypothyroidism from  Hashimoto's thyroiditis:   - Her thyroid function test are consistent with inadequate replacement, however this is mainly due to her missed doses.  She promises to do better and take her hormone more consistently.  She is advised to continue  levothyroxine 150 mcg p.o. daily before breakfast.  - - We discussed about the correct intake of her thyroid hormone, on empty stomach at fasting, with water, separated by at least 30 minutes from breakfast and other medications,  and separated by more than 4 hours from calcium, iron, multivitamins, acid reflux medications (PPIs). -Patient is made aware of the fact that thyroid hormone replacement is needed for life, dose to be adjusted by periodic monitoring of thyroid function tests.   2) multinodular goiter  -Her previsit thyroid ultrasound shows stable 1 cm nodule on the right lobe, stable finding for 7 years.  She will not need any intervention or further follow-up imaging.    3) prediabetes:  -Her most recent A1c is 6.1%, increasing from 5.8%.  She did not tolerate Metformin.   - she  admits there is a room for improvement in her diet and drink choices. -  Suggestion is made for her to avoid simple carbohydrates  from her diet including Cakes, Sweet Desserts / Pastries, Ice Cream, Soda (diet and regular), Sweet Tea, Candies, Chips, Cookies, Sweet Pastries,  Store Bought Juices, Alcohol in Excess of  1-2 drinks a day, Artificial Sweeteners, Coffee Creamer, and "Sugar-free" Products. This will help patient to have stable blood glucose profile and potentially avoid unintended weight gain.   - I advised her to be consistent with her omega-3 fatty acids 1000 mg total daily.  She is advised to maintain close follow-up with her PMD Dr. [name].     - Time spent on this patient care encounter:  25 minutes of which 50% was  spent in  counseling and the rest reviewing  her current and  previous labs / studies and medications  doses and developing a  plan for long term care. Stacy [name]  participated in the discussions, expressed understanding, and voiced agreement with the above plans.  All questions were answered to her satisfaction. she is encouraged to contact clinic should she have any questions or concerns prior to her return visit.  Follow up plan: Return in about 6 months (around 12/10/2019) for Next Visit A1c in Office, Follow up with Pre-visit Labs.  Glade Lloyd, MD Phone: [phone]  Fax: [phone]  -  This note was partially dictated with voice recognition software. Similar sounding words can be transcribed inadequately or may not  be corrected upon review.  06/12/2019, 10:58 AM

## 2019-06-13 ENCOUNTER — Other Ambulatory Visit: Payer: Self-pay | Admitting: Family Medicine

## 2019-07-03 NOTE — Telephone Encounter (Signed)
Called pt, she hasn't scheduled CT. Informed her insurance Josem Kaufmann is valid until 07/21/19. States she will call GI today.

## 2019-07-30 ENCOUNTER — Ambulatory Visit: Payer: 59 | Admitting: Family Medicine

## 2019-08-13 ENCOUNTER — Encounter: Payer: Self-pay | Admitting: Gastroenterology

## 2019-08-13 ENCOUNTER — Ambulatory Visit: Payer: 59 | Admitting: Gastroenterology

## 2019-08-24 ENCOUNTER — Other Ambulatory Visit: Payer: Self-pay | Admitting: "Endocrinology

## 2019-08-26 ENCOUNTER — Other Ambulatory Visit: Payer: Self-pay | Admitting: "Endocrinology

## 2019-08-26 MED ORDER — LEVOTHYROXINE SODIUM 150 MCG PO TABS: 150.0000 ug | ORAL_TABLET | Freq: Every day | ORAL | 1 refills | Status: DC

## 2019-08-26 NOTE — Telephone Encounter (Signed)
She is on 150 . I will take care of it.

## 2019-08-26 NOTE — Telephone Encounter (Signed)
Is pt on 137mg  or 150mcg

## 2019-10-29 ENCOUNTER — Other Ambulatory Visit: Payer: Self-pay

## 2019-10-29 ENCOUNTER — Ambulatory Visit
Admission: EM | Admit: 2019-10-29 | Discharge: 2019-10-29 | Disposition: A | Discharge status: Home / Self Care | Payer: 59 | Attending: Emergency Medicine | Admitting: Emergency Medicine

## 2019-10-29 ENCOUNTER — Encounter: Payer: Self-pay | Admitting: Emergency Medicine

## 2019-10-29 DIAGNOSIS — J029 Acute pharyngitis, unspecified: Secondary | ICD-10-CM | POA: Diagnosis present

## 2019-10-29 LAB — POCT RAPID STREP A (OFFICE): Rapid Strep A Screen: NEGATIVE

## 2019-10-29 MED ORDER — AMOXICILLIN-POT CLAVULANATE 875-125 MG PO TABS: 1.0000 | ORAL_TABLET | Freq: Two times a day (BID) | ORAL | 0 refills | Status: DC

## 2019-10-29 NOTE — ED Provider Notes (Signed)
[place]   [id number] 10/29/19 Arrival Time: 27  Chief Complaint  Patient presents with  . Sore Throat     SUBJECTIVE: History from: patient.  Stacy [name] is a 59 y.o. female who presented to the urgent care with a complaint of sore throat for the past 4 days.  Denies sick exposure to strep, flu or mono, or precipitating event.  Has tried OTC medication without relief.  Symptoms are made worse with swallowing, but tolerating liquids and own secretions without difficulty.  Denies previous symptoms in the past.  Denies fever, chills, fatigue, ear pain, sinus pain, rhinorrhea, nasal congestion, cough, SOB, wheezing, chest pain, nausea, rash, changes in bowel or bladder habits.     ROS: As per HPI.  All other pertinent ROS negative.      Past Medical History:  Diagnosis Date  . Abnormal uterine bleeding   . Chronic leukopenia 08/17/2016   WBC = 3.5  . DDD (degenerative disc disease), cervical   . Dyslipidemia   . Elevated serum creatinine 08/17/2016  . GERD (gastroesophageal reflux disease)   . History of esophageal dilatation    2013  . History of ovarian cyst   . Hypothyroidism   . Lactose intolerance   . OSA on CPAP    study done 04-08-2013  . Pre-diabetes   . Right thyroid nodule   . SUI (stress urinary incontinence, female)   . Tonsillar exudate 08/13/2014  . Wears glasses    Past Surgical History:  Procedure Laterality Date  . CESAREAN SECTION  1997   Twins-1 twin del.by c/s, 1 del.vaginally  . COLONOSCOPY  04/04/2011   Internal hemorrhoids/Nl colonoscopy-SLIGHTLY TORTUOUS COLON  . DILATATION & CURETTAGE/HYSTEROSCOPY WITH MYOSURE N/A 08/24/2016   Procedure: DILATATION & CURETTAGE/HYSTEROSCOPY WITH MYOSURE Possible Myosure ;  Surgeon: Nunzio Cobbs, MD;  Location: [facility];  Service: Gynecology;  Laterality: N/A;  . DILATION AND CURETTAGE OF UTERUS  1998   Seconday to miscarriage  . ESOPHAGOGASTRODUODENOSCOPY  09/22/2011   Mild  gastritis/Hiatal hernia/DYSPHAGIA DUE TO PEPTIC STRICTURE/UNCONTROLLED GERD  . KNEE ARTHROSCOPY Right 2003  . MALONEY DILATION  09/22/2011   Procedure: MALONEY DILATION;  Surgeon: Danie Binder, MD;  Location: [facility];  Service: Endoscopy;  Laterality: N/A;  . SAVORY DILATION  09/22/2011   Procedure: SAVORY DILATION;  Surgeon: Danie Binder, MD;  Location: [facility];  Service: Endoscopy;  Laterality: N/A;   Allergies  Allergen Reactions  . Lactose Intolerance (Gi)     Vomiting, diarrhea, bloating   No current facility-administered medications on file prior to encounter.   Current Outpatient Medications on File Prior to Encounter  Medication Sig Dispense Refill  . Cholecalciferol (VITAMIN D-3) 125 MCG (5000 UT) TABS Take 1 capsule by mouth daily. 60 tablet 0  . famotidine (PEPCID) 20 MG tablet Take 1 tablet (20 mg total) by mouth 2 (two) times daily. 30 tablet 3  . fluconazole (DIFLUCAN) 150 MG tablet Take one tablet by mouth once daily as needed, for vaginitis 2 tablet 0  . ibuprofen (ADVIL) 600 MG tablet TAKE 1 TABLET BY MOUTH THREE TIMES DAILY FOR 1 WEEK (Patient not taking: Reported on 06/05/2019) 21 tablet 0  . levothyroxine (SYNTHROID) 150 MCG tablet Take 1 tablet (150 mcg total) by mouth daily before breakfast. 90 tablet 1  . lidocaine (XYLOCAINE) 2 % solution Using a syringe, 10 mL PO q4h or 30 MINS PRIOR TO MEALS AND PRN FOR ABDOMINAL/CHEST PAIN. NO MORE> 8 DOSES/DAY.  300 mL 5  . Multiple Vitamin (MULTIVITAMIN) tablet Take 1 tablet by mouth daily.    Marland Kitchen omeprazole (PRILOSEC) 40 MG capsule 1 PO 30 MINS PRIOR TO FIRST MEAL DAILY (Patient taking differently: 1 PO 30 MINS PRIOR TO FIRST MEAL DAILY- not taking) 30 capsule 11  . penicillin v potassium (VEETID) 500 MG tablet Take 1 tablet (500 mg total) by mouth 3 (three) times daily. 30 tablet 0  . predniSONE (DELTASONE) 10 MG tablet Take one tablet by mouth 3 times daily for 2 days, then take one tablet two times daily for 2 days,  then take one tablet once daily for 2 days, then stop (Patient not taking: Reported on 06/05/2019) 12 tablet 0  . vitamin C (ASCORBIC ACID) 500 MG tablet Take 500 mg by mouth daily.     Social History   Socioeconomic History  . Marital status: Married    Spouse name: Not on file  . Number of children: Not on file  . Years of education: Not on file  . Highest education level: Not on file  Occupational History  . Occupation: Full time: [employer]  Tobacco Use  . Smoking status: Never Smoker  . Smokeless tobacco: Never Used  Vaping Use  . Vaping Use: Never used  Substance and Sexual Activity  . Alcohol use: No  . Drug use: No  . Sexual activity: Yes    Partners: Male    Birth control/protection: Other-see comments    Comment: husband with vasectomy  Other Topics Concern  . Not on file  Social History Narrative   Married to [name], neurology   Regular exercise: No   Social Determinants of Health   Financial Resource Strain:   . Difficulty of Paying Living Expenses:   Food Insecurity:   . Worried About Charity fundraiser in the Last Year:   . Arboriculturist in the Last Year:   Transportation Needs:   . Film/video editor (Medical):   Marland Kitchen Lack of Transportation (Non-Medical):   Physical Activity:   . Days of Exercise per Week:   . Minutes of Exercise per Session:   Stress:   . Feeling of Stress :   Social Connections:   . Frequency of Communication with Friends and Family:   . Frequency of Social Gatherings with Friends and Family:   . Attends Religious Services:   . Active Member of Clubs or Organizations:   . Attends Archivist Meetings:   Marland Kitchen Marital Status:   Intimate Partner Violence:   . Fear of Current or Ex-Partner:   . Emotionally Abused:   Marland Kitchen Physically Abused:   . Sexually Abused:    Family History  Problem Relation Age of Onset  . Hypertension Mother   . Angina Mother        MVP. h/o intestinal polyps possibly  . Hyperlipidemia Mother    . Transient ischemic attack Mother   . Hypertension Father   . Hyperlipidemia Father        glucose intolerant  . Diabetes Father   . GER disease Brother   . Diabetes Brother   . Diabetes Maternal Grandfather   . Hyperlipidemia Paternal Grandmother   . Thyroid disease Paternal Grandmother   . Hyperlipidemia Paternal Grandfather   . Thyroid disease Brother        hyperthyroid  . Colon cancer Neg Hx   . Breast cancer Neg Hx   . Ovarian cancer Neg Hx   . Anesthesia problems  Neg Hx     OBJECTIVE:  Vitals:   10/29/19 1750 10/29/19 1751  BP: 134/66   Resp: 18   Temp: 98.3 F (36.8 C)   TempSrc: Oral   SpO2: 98%   Weight:  187 lb (84.8 kg)     General appearance: alert; appears fatigued, but nontoxic, speaking in full sentences and managing own secretions HEENT: NCAT; Ears: EACs clear, TMs pearly gray with visible cone of light, without erythema; Eyes: PERRL, EOMI grossly; Nose: no obvious rhinorrhea; Throat: oropharynx clear, tonsils 2+ and mildly erythematous without white tonsillar exudates, uvula midline Neck: supple without LAD Lungs: CTA bilaterally without adventitious breath sounds; cough absent Heart: regular rate and rhythm.  Radial pulses 2+ symmetrical bilaterally Skin: warm and dry Psychological: alert and cooperative; normal mood and affect  LABS: Results for orders placed or performed during the hospital encounter of 10/29/19 (from the past 24 hour(s))  POCT rapid strep A     Status: None   Collection Time: 10/29/19  6:00 PM  Result Value Ref Range   Rapid Strep A Screen Negative Negative     ASSESSMENT & PLAN:  1. Sore throat     Meds ordered this encounter  Medications  . amoxicillin-clavulanate (AUGMENTIN) 875-125 MG tablet    Sig: Take 1 tablet by mouth every 12 (twelve) hours.    Dispense:  14 tablet    Refill:  0   Patient is stable at discharge.  Strep test was negative however we will prescribe Augmentin for possible tonsillitis.  Will  await strep test culture.  Discharge instructions  Strep test negative, will send out for culture and we will call you with results Get plenty of rest and push fluids Continue viscous lidocaine for symptomatic relief.  This is an oral solution you can swish, and gargle as needed for symptomatic relief of sore throat.  Do not exceed 8 doses in a 24 hour period.  Do not use prior to eating, as this will numb your entire mouth.   Drink warm or cool liquids, use throat lozenges, or popsicles to help alleviate symptoms Take OTC ibuprofen or tylenol as needed for pain Follow up with PCP if symptoms persists Return or go to ER if patient has any new or worsening symptoms such as fever, chills, nausea, vomiting, worsening sore throat, cough, abdominal pain, chest pain, changes in bowel or bladder habits, etc...  Reviewed expectations re: course of current medical issues. Questions answered. Outlined signs and symptoms indicating need for more acute intervention. Patient verbalized understanding. After Visit Summary given.     Note: This document was prepared using Dragon voice recognition software and may include unintentional dictation errors.    Emerson Monte, [place] 10/29/19 1814

## 2019-10-29 NOTE — ED Triage Notes (Signed)
Sore throat x4 days.  No fevers.

## 2019-10-29 NOTE — Discharge Instructions (Addendum)
Strep test negative, will send out for culture and we will call you with results Get plenty of rest and push fluids Continue viscous lidocaine for symptomatic relief.  This is an oral solution you can swish, and gargle as needed for symptomatic relief of sore throat.  Do not exceed 8 doses in a 24 hour period.  Do not use prior to eating, as this will numb your entire mouth.   Drink warm or cool liquids, use throat lozenges, or popsicles to help alleviate symptoms Take OTC ibuprofen or tylenol as needed for pain Follow up with PCP if symptoms persists Return or go to ER if patient has any new or worsening symptoms such as fever, chills, nausea, vomiting, worsening sore throat, cough, abdominal pain, chest pain, changes in bowel or bladder habits, etc..Marland Kitchen

## 2019-11-01 LAB — CULTURE, GROUP A STREP (THRC)

## 2019-11-25 ENCOUNTER — Telehealth: Payer: Self-pay | Admitting: Family Medicine

## 2019-11-25 DIAGNOSIS — E559 Vitamin D deficiency, unspecified: Secondary | ICD-10-CM

## 2019-11-25 DIAGNOSIS — E038 Other specified hypothyroidism: Secondary | ICD-10-CM

## 2019-11-25 DIAGNOSIS — E785 Hyperlipidemia, unspecified: Secondary | ICD-10-CM

## 2019-11-25 DIAGNOSIS — E8881 Metabolic syndrome: Secondary | ICD-10-CM

## 2019-11-25 NOTE — Telephone Encounter (Signed)
Left msg that fasting labs ordered and to go to [facility] to have done

## 2019-11-25 NOTE — Telephone Encounter (Signed)
What labs does patient need before her 8/5 visit?

## 2019-11-25 NOTE — Telephone Encounter (Signed)
Patient wants to know if she needs to get bloodwork done before her appt Thursday 8/5. Please call back to verify.

## 2019-11-25 NOTE — Telephone Encounter (Signed)
Fasting cBC, lipid, cmp and EGFR, and vit D, has soon appt with Dr nida and should take his lab order at same time

## 2019-11-27 ENCOUNTER — Encounter: Payer: Self-pay | Admitting: Family Medicine

## 2019-11-27 ENCOUNTER — Other Ambulatory Visit: Payer: Self-pay

## 2019-11-27 ENCOUNTER — Ambulatory Visit (INDEPENDENT_AMBULATORY_CARE_PROVIDER_SITE_OTHER): Payer: 59 | Admitting: Family Medicine

## 2019-11-27 VITALS — BP 128/64 | HR 68 | Resp 16 | Ht 67.0 in | Wt 189.0 lb

## 2019-11-27 DIAGNOSIS — E038 Other specified hypothyroidism: Secondary | ICD-10-CM | POA: Diagnosis not present

## 2019-11-27 DIAGNOSIS — R7303 Prediabetes: Secondary | ICD-10-CM

## 2019-11-27 DIAGNOSIS — M5090 Cervical disc disorder, unspecified, unspecified cervical region: Secondary | ICD-10-CM

## 2019-11-27 DIAGNOSIS — E663 Overweight: Secondary | ICD-10-CM | POA: Diagnosis not present

## 2019-11-27 DIAGNOSIS — E785 Hyperlipidemia, unspecified: Secondary | ICD-10-CM

## 2019-11-27 DIAGNOSIS — G479 Sleep disorder, unspecified: Secondary | ICD-10-CM

## 2019-11-27 NOTE — Patient Instructions (Signed)
Follow-up with MD in 6 months call if you need me sooner.  You are being referred for diabetic and obesity education to the educator.  Please start calorie counting and limit Total Caloric intake for 24 hours to 1500 cal.  Please start carb counting and limit carbohydrates per meal to 30 to 45 g.  Please commit to 64 ounces of water daily.  Please aim to eat 3 meals daily later has been by 7 in the evening.  Snacks are allowed but these need to be vegetables for example carrots tomatoes cucumbers.  Please commit to exercising between 20 to 40 minutes 5 days/week.  Please commit to using your CPAP machine on a daily basis to protect your heart lungs and brain.  Labs that are ordered fasting are complete blood count kidney and liver function thyroid function vitamin D and your glycohemoglobin will be done in the office with Dr. Dorris Fetch when you see  Thanks for choosing [facility], we consider it a privelige to serve you.

## 2019-11-28 ENCOUNTER — Encounter: Payer: Self-pay | Admitting: Family Medicine

## 2019-11-28 NOTE — Progress Notes (Signed)
Stacy [name]     MRN: [id number]      DOB: 12-12-1960   HPI Stacy [name] is here for follow up and re-evaluation of chronic medical conditions, medication management and review of any available recent lab and radiology data.  Preventive health is updated, specifically  Cancer screening and Immunization.   Questions or concerns regarding consultations or procedures which the PT has had in the interim are  addressed. The PT denies any adverse reactions to current medications since the last visit.  C/o intermittent right inner thigh pain in last several weeks No regular exercise and uses CPAP inconsistently  ROS Denies recent fever or chills. Denies sinus pressure, nasal congestion, ear pain or sore throat. Denies chest congestion, productive cough or wheezing. Denies chest pains, palpitations and leg swelling Denies abdominal pain, nausea, vomiting,diarrhea or constipation.   Denies dysuria, frequency, hesitancy or incontinence. C/o intermittent  joint pain, denies swelling and limitation in mobility. Denies headaches, seizures, numbness, or tingling. Denies depression, anxiety or insomnia. Denies skin break down or rash.   PE  BP 128/64   Pulse 68   Resp 16   Ht 5\' 7"  (1.702 m)   Wt 189 lb (85.7 kg)   LMP 09/20/2016 (Approximate)   SpO2 98%   BMI 29.60 kg/m   Patient alert and oriented and in no cardiopulmonary distress.  HEENT: No facial asymmetry, EOMI,     Neck supple .  Chest: Clear to auscultation bilaterally.  CVS: S1, S2 no murmurs, no S3.Regular rate.  ABD: Soft non tender.   Ext: No edema  MS: Adequate ROM spine, shoulders, hips and knees.  Skin: Intact, no ulcerations or rash noted.  Psych: Good eye contact, normal affect. Memory intact not anxious or depressed appearing.  CNS: CN 2-12 intact, power,  normal throughout.no focal deficits noted.   Assessment & Plan  Dyslipidemia Hyperlipidemia:Low fat diet discussed and encouraged.   Lipid  Panel  Lab Results  Component Value Date   CHOL 197 05/29/2019   HDL 45 (L) 05/29/2019   LDLCALC 124 (H) 05/29/2019   TRIG 162 (H) 05/29/2019   CHOLHDL 4.4 05/29/2019   Updated lab needed at/ before next visit.     Overweight (BMI 25.0-29.9)  Patient re-educated about  the importance of commitment to a  minimum of 150 minutes of exercise per week as able.  The importance of healthy food choices with portion control discussed, as well as eating regularly and within a 12 hour window most days. The need to choose "clean , green" food 50 to 75% of the time is discussed, as well as to make water the primary drink and set a goal of 64 ounces water daily.    Weight /BMI 11/27/2019 10/29/2019 06/05/2019  WEIGHT 189 lb 187 lb 192 lb 3.2 oz  HEIGHT 5\' 7"  - 5\' 7"   BMI 29.6 kg/m2 29.29 kg/m2 30.1 kg/m2      Hypothyroidism Endo manages and has upcoming lab  Cervical back pain with evidence of disc disease Has recently completed course of PT with fairly food result  Prediabetes Patient educated about the importance of limiting  Carbohydrate intake , the need to commit to daily physical activity for a minimum of 30 minutes , and to commit weight loss. The fact that changes in all these areas will reduce or eliminate all together the development of diabetes is stressed.  Followed by Endo Updated lab needed .   Diabetic Labs Latest Ref Rng & Units 05/29/2019 11/05/2018  04/22/2018 04/03/2017 08/17/2016  HbA1c <5.7 % of total Hgb 6.1(H) 5.8(H) 6.0(H) 5.9(H) 5.8(H)  Chol <200 mg/dL 197 201(H) - 209(H) -  HDL > OR = 50 mg/dL 45(L) 48(L) - 49(L) -  Calc LDL mg/dL (calc) 124(H) 128(H) - 131(H) -  Triglycerides <150 mg/dL 162(H) 133 - 156(H) -  Creatinine 0.50 - 1.05 mg/dL 1.04 1.11(H) - - 1.03(H)   BP/Weight 11/27/2019 10/29/2019 06/05/2019 05/29/2019 05/19/2019 01/29/2019 42/09/8339  Systolic BP 962 229 798 921 194 174 081  Diastolic BP 64 66 60 78 84 76 78  Wt. (Lbs) 189 187 192.2 189.12 190 - 187   BMI 29.6 29.29 30.1 29.62 30.67 - 29.29   Foot/eye exam completion dates Latest Ref Rng & Units 12/09/2013  Eye Exam No Retinopathy No Retinopathy  Foot Form Completion - -      Sleep disorder regularl use of CPAP encouraged

## 2019-11-28 NOTE — Assessment & Plan Note (Signed)
Hyperlipidemia:Low fat diet discussed and encouraged.   Lipid Panel  Lab Results  Component Value Date   CHOL 197 05/29/2019   HDL 45 (L) 05/29/2019   LDLCALC 124 (H) 05/29/2019   TRIG 162 (H) 05/29/2019   CHOLHDL 4.4 05/29/2019   Updated lab needed at/ before next visit.

## 2019-11-28 NOTE — Assessment & Plan Note (Signed)
Has recently completed course of PT with fairly food result

## 2019-11-28 NOTE — Assessment & Plan Note (Signed)
Endo manages and has upcoming lab

## 2019-11-28 NOTE — Assessment & Plan Note (Signed)
  Patient re-educated about  the importance of commitment to a  minimum of 150 minutes of exercise per week as able.  The importance of healthy food choices with portion control discussed, as well as eating regularly and within a 12 hour window most days. The need to choose "clean , green" food 50 to 75% of the time is discussed, as well as to make water the primary drink and set a goal of 64 ounces water daily.    Weight /BMI 11/27/2019 10/29/2019 06/05/2019  WEIGHT 189 lb 187 lb 192 lb 3.2 oz  HEIGHT 5\' 7"  - 5\' 7"   BMI 29.6 kg/m2 29.29 kg/m2 30.1 kg/m2

## 2019-11-28 NOTE — Assessment & Plan Note (Signed)
regularl use of CPAP encouraged

## 2019-11-28 NOTE — Assessment & Plan Note (Signed)
Patient educated about the importance of limiting  Carbohydrate intake , the need to commit to daily physical activity for a minimum of 30 minutes , and to commit weight loss. The fact that changes in all these areas will reduce or eliminate all together the development of diabetes is stressed.  Followed by Endo Updated lab needed .   Diabetic Labs Latest Ref Rng & Units 05/29/2019 11/05/2018 04/22/2018 04/03/2017 08/17/2016  HbA1c <5.7 % of total Hgb 6.1(H) 5.8(H) 6.0(H) 5.9(H) 5.8(H)  Chol <200 mg/dL 197 201(H) - 209(H) -  HDL > OR = 50 mg/dL 45(L) 48(L) - 49(L) -  Calc LDL mg/dL (calc) 124(H) 128(H) - 131(H) -  Triglycerides <150 mg/dL 162(H) 133 - 156(H) -  Creatinine 0.50 - 1.05 mg/dL 1.04 1.11(H) - - 1.03(H)   BP/Weight 11/27/2019 10/29/2019 06/05/2019 05/29/2019 05/19/2019 01/29/2019 75/10/9726  Systolic BP 206 015 615 379 432 761 470  Diastolic BP 64 66 60 78 84 76 78  Wt. (Lbs) 189 187 192.2 189.12 190 - 187  BMI 29.6 29.29 30.1 29.62 30.67 - 29.29   Foot/eye exam completion dates Latest Ref Rng & Units 12/09/2013  Eye Exam No Retinopathy No Retinopathy  Foot Form Completion - -

## 2019-12-04 ENCOUNTER — Other Ambulatory Visit: Payer: Self-pay

## 2019-12-04 DIAGNOSIS — R7303 Prediabetes: Secondary | ICD-10-CM

## 2019-12-04 NOTE — Progress Notes (Signed)
a1c

## 2019-12-10 ENCOUNTER — Other Ambulatory Visit: Payer: Self-pay

## 2019-12-10 DIAGNOSIS — E042 Nontoxic multinodular goiter: Secondary | ICD-10-CM

## 2019-12-10 DIAGNOSIS — E039 Hypothyroidism, unspecified: Secondary | ICD-10-CM

## 2019-12-10 NOTE — Progress Notes (Signed)
tsh

## 2019-12-11 ENCOUNTER — Ambulatory Visit: Payer: 59 | Admitting: "Endocrinology

## 2019-12-12 LAB — VITAMIN D 25 HYDROXY (VIT D DEFICIENCY, FRACTURES): Vit D, 25-Hydroxy: 46.9 ng/mL (ref 30.0–100.0)

## 2019-12-12 LAB — CMP14+EGFR
ALT: 30 IU/L (ref 0–32)
AST: 25 IU/L (ref 0–40)
Albumin/Globulin Ratio: 1.4 (ref 1.2–2.2)
Albumin: 4.4 g/dL (ref 3.8–4.9)
Alkaline Phosphatase: 85 IU/L (ref 48–121)
BUN/Creatinine Ratio: 13 (ref 9–23)
BUN: 15 mg/dL (ref 6–24)
Bilirubin Total: 0.5 mg/dL (ref 0.0–1.2)
CO2: 27 mmol/L (ref 20–29)
Calcium: 9.1 mg/dL (ref 8.7–10.2)
Chloride: 103 mmol/L (ref 96–106)
Creatinine, Ser: 1.15 mg/dL — ABNORMAL HIGH (ref 0.57–1.00)
GFR calc Af Amer: 60 mL/min/{1.73_m2} (ref >59)
GFR calc non Af Amer: 52 mL/min/{1.73_m2} — ABNORMAL LOW (ref >59)
Globulin, Total: 3.1 g/dL (ref 1.5–4.5)
Glucose: 82 mg/dL (ref 65–99)
Potassium: 4.1 mmol/L (ref 3.5–5.2)
Sodium: 143 mmol/L (ref 134–144)
Total Protein: 7.5 g/dL (ref 6.0–8.5)

## 2019-12-12 LAB — CBC
Hematocrit: 37.5 % (ref 34.0–46.6)
Hemoglobin: 12.5 g/dL (ref 11.1–15.9)
MCH: 29.2 pg (ref 26.6–33.0)
MCHC: 33.3 g/dL (ref 31.5–35.7)
MCV: 88 fL (ref 79–97)
Platelets: 169 10*3/uL (ref 150–450)
RBC: 4.28 x10E6/uL (ref 3.77–5.28)
RDW: 13 % (ref 11.7–15.4)
WBC: 3.8 10*3/uL (ref 3.4–10.8)

## 2019-12-12 LAB — LIPID PANEL
Chol/HDL Ratio: 4.3 ratio (ref 0.0–4.4)
Cholesterol, Total: 204 mg/dL — ABNORMAL HIGH (ref 100–199)
HDL: 48 mg/dL (ref >39)
LDL Chol Calc (NIH): 132 mg/dL — ABNORMAL HIGH (ref 0–99)
Triglycerides: 132 mg/dL (ref 0–149)
VLDL Cholesterol Cal: 24 mg/dL (ref 5–40)

## 2019-12-12 LAB — TSH: TSH: 0.589 u[IU]/mL (ref 0.450–4.500)

## 2019-12-12 LAB — T4, FREE: Free T4: 1.43 ng/dL (ref 0.82–1.77)

## 2019-12-18 ENCOUNTER — Other Ambulatory Visit: Payer: Self-pay

## 2019-12-18 ENCOUNTER — Encounter: Payer: Self-pay | Admitting: "Endocrinology

## 2019-12-18 ENCOUNTER — Ambulatory Visit (INDEPENDENT_AMBULATORY_CARE_PROVIDER_SITE_OTHER): Payer: 59 | Admitting: "Endocrinology

## 2019-12-18 VITALS — BP 120/77 | HR 60 | Ht 67.0 in | Wt 185.4 lb

## 2019-12-18 DIAGNOSIS — E042 Nontoxic multinodular goiter: Secondary | ICD-10-CM | POA: Diagnosis not present

## 2019-12-18 DIAGNOSIS — E782 Mixed hyperlipidemia: Secondary | ICD-10-CM | POA: Diagnosis not present

## 2019-12-18 DIAGNOSIS — E039 Hypothyroidism, unspecified: Secondary | ICD-10-CM | POA: Diagnosis not present

## 2019-12-18 DIAGNOSIS — R7303 Prediabetes: Secondary | ICD-10-CM

## 2019-12-18 LAB — POCT GLYCOSYLATED HEMOGLOBIN (HGB A1C): Hemoglobin A1C: 5.7 % — AB (ref 4.0–5.6)

## 2019-12-18 MED ORDER — ROSUVASTATIN CALCIUM 5 MG PO TABS: 5.0000 mg | ORAL_TABLET | Freq: Every day | ORAL | 1 refills | Status: DC

## 2019-12-18 NOTE — Progress Notes (Signed)
12/18/2019      Endocrinology follow-up note   Subjective:    Patient ID: Stacy [name], female    DOB: 25-Aug-1960,  PCP Stacy Helper, MD   Past Medical History:  Diagnosis Date  . Abnormal uterine bleeding   . Chronic leukopenia 08/17/2016   WBC = 3.5  . DDD (degenerative disc disease), cervical   . Dyslipidemia   . Elevated serum creatinine 08/17/2016  . GERD (gastroesophageal reflux disease)   . History of esophageal dilatation    2013  . History of ovarian cyst   . Hypothyroidism   . Lactose intolerance   . OSA on CPAP    study done 04-08-2013  . Pre-diabetes   . Right thyroid nodule   . SUI (stress urinary incontinence, female)   . Tonsillar exudate 08/13/2014  . Wears glasses    Past Surgical History:  Procedure Laterality Date  . CESAREAN SECTION  1997   Twins-1 twin del.by c/s, 1 del.vaginally  . COLONOSCOPY  04/04/2011   Internal hemorrhoids/Nl colonoscopy-SLIGHTLY TORTUOUS COLON  . DILATATION & CURETTAGE/HYSTEROSCOPY WITH MYOSURE N/A 08/24/2016   Procedure: DILATATION & CURETTAGE/HYSTEROSCOPY WITH MYOSURE Possible Myosure ;  Surgeon: Nunzio Cobbs, MD;  Location: [facility];  Service: Gynecology;  Laterality: N/A;  . DILATION AND CURETTAGE OF UTERUS  1998   Seconday to miscarriage  . ESOPHAGOGASTRODUODENOSCOPY  09/22/2011   Mild gastritis/Hiatal hernia/DYSPHAGIA DUE TO PEPTIC STRICTURE/UNCONTROLLED GERD  . KNEE ARTHROSCOPY Right 2003  . MALONEY DILATION  09/22/2011   Procedure: MALONEY DILATION;  Surgeon: Danie Binder, MD;  [facility];  Service: Endoscopy;  Laterality: N/A;  . SAVORY DILATION  09/22/2011   Procedure: SAVORY DILATION;  Surgeon: Danie Binder, MD;  Location: [facility];  Service: Endoscopy;  Laterality: N/A;   Social History   Socioeconomic History  . Marital status: Married    Spouse name: Not on file  . Number of children: Not on file  . Years of education: Not on file  . Highest education level: Not  on file  Occupational History  . Occupation: Full time: [employer]  Tobacco Use  . Smoking status: Never Smoker  . Smokeless tobacco: Never Used  Vaping Use  . Vaping Use: Never used  Substance and Sexual Activity  . Alcohol use: No  . Drug use: No  . Sexual activity: Yes    Partners: Male    Birth control/protection: Other-see comments    Comment: husband with vasectomy  Other Topics Concern  . Not on file  Social History Narrative   Married to [name], neurology   Regular exercise: No   Social Determinants of Health   Financial Resource Strain:   . Difficulty of Paying Living Expenses: Not on file  Food Insecurity:   . Worried About Charity fundraiser in the Last Year: Not on file  . Ran Out of Food in the Last Year: Not on file  Transportation Needs:   . Lack of Transportation (Medical): Not on file  . Lack of Transportation (Non-Medical): Not on file  Physical Activity:   . Days of Exercise per Week: Not on file  . Minutes of Exercise per Session: Not on file  Stress:   . Feeling of Stress : Not on file  Social Connections:   . Frequency of Communication with Friends and Family: Not on file  . Frequency of Social Gatherings with Friends and Family: Not on file  . Attends Religious Services: Not  on file  . Active Member of Clubs or Organizations: Not on file  . Attends Archivist Meetings: Not on file  . Marital Status: Not on file   Outpatient Encounter Medications as of 12/18/2019  Medication Sig  . Cholecalciferol (VITAMIN D-3) 125 MCG (5000 UT) TABS Take 1 capsule by mouth daily.  Marland Kitchen levothyroxine (SYNTHROID) 150 MCG tablet Take 1 tablet (150 mcg total) by mouth daily before breakfast.  . Multiple Vitamin (MULTIVITAMIN) tablet Take 1 tablet by mouth daily.  Marland Kitchen omeprazole (PRILOSEC) 40 MG capsule Take 40 mg by mouth daily.  . rosuvastatin (CRESTOR) 5 MG tablet Take 1 tablet (5 mg total) by mouth daily.   No facility-administered encounter medications  on file as of 12/18/2019.   ALLERGIES: Allergies  Allergen Reactions  . Lactose Intolerance (Gi)     Vomiting, diarrhea, bloating   VACCINATION STATUS: Immunization History  Administered Date(s) Administered  . Influenza Split 02/10/2014  . Influenza,inj,Quad PF,6+ Mos 01/30/2013, 01/12/2015, 01/30/2017, 04/22/2018, 01/29/2019  . Moderna SARS-COVID-2 Vaccination 07/09/2019, 07/29/2019  . Tdap 05/09/2012  . Zoster Recombinat (Shingrix) 05/21/2018    HPI  59 yr old female with hypothyroidism from Hashimoto's thyroiditis. She is currently on levothyroxine 150 mcg daily before breakfast.  She reports better consistency taking her medication.  She has no new complaints today.  She has lost 7 pounds since last visit.  She has engaged in regular exercise.    -  She denies tremors, heat intolerance.  Her previsit thyroid function tests are consistent with under replacement. she denies any family hx of thyroid dysfunction. She has a stable  1.0 cm right thyroid lobe nodule which has remained stable for 7 years.   -She is a mother of  4 grown children, with hx of HEELP syndrome during the first pregnancy. -She did not tolerate metformin which was prescribed due to prediabetes, most recent A1c was 5.7% improving from 6.1%.  She has dyslipidemia on no treatment.  -She is on ongoing vitamin D supplement.       Review of Systems Limited as above.  Objective:    BP 120/77   Pulse 60   Ht 5\' 7"  (1.702 m)   Wt 185 lb 6.4 oz (84.1 kg)   LMP 09/20/2016 (Approximate)   BMI 29.04 kg/m   Wt Readings from Last 3 Encounters:  12/18/19 185 lb 6.4 oz (84.1 kg)  11/27/19 189 lb (85.7 kg)  10/29/19 187 lb (84.8 kg)      Results for orders placed or performed in visit on 12/18/19  HgB A1c  Result Value Ref Range   Hemoglobin A1C 5.7 (A) 4.0 - 5.6 %   HbA1c POC (<> result, manual entry)     HbA1c, POC (prediabetic range)     HbA1c, POC (controlled diabetic range)     Chemistry (most  recent): Lab Results  Component Value Date   NA 143 12/11/2019   K 4.1 12/11/2019   CL 103 12/11/2019   CO2 27 12/11/2019   BUN 15 12/11/2019   CREATININE 1.15 (H) 12/11/2019   Diabetic Labs (most recent): Lab Results  Component Value Date   HGBA1C 5.7 (A) 12/18/2019   HGBA1C 6.1 (H) 05/29/2019   HGBA1C 5.8 (H) 11/05/2018   Lipid Panel     Component Value Date/Time   CHOL 204 (H) 12/11/2019 1150   TRIG 132 12/11/2019 1150   HDL 48 12/11/2019 1150   CHOLHDL 4.3 12/11/2019 1150   CHOLHDL 4.4 05/29/2019 1412   VLDL  19 03/09/2015 1509   LDLCALC 132 (H) 12/11/2019 1150   LDLCALC 124 (H) 05/29/2019 1412     Assessment & Plan:   1) hypothyroidism from Hashimoto's thyroiditis:   - Her thyroid function test are consistent with appropriate replacement.  She is advised to continue  levothyroxine 150 mcg p.o. daily before breakfast.   - We discussed about the correct intake of her thyroid hormone, on empty stomach at fasting, with water, separated by at least 30 minutes from breakfast and other medications,  and separated by more than 4 hours from calcium, iron, multivitamins, acid reflux medications (PPIs). -Patient is made aware of the fact that thyroid hormone replacement is needed for life, dose to be adjusted by periodic monitoring of thyroid function tests.   2) multinodular goiter  -Her previsit thyroid ultrasound shows stable 1 cm nodule on the right lobe, stable finding for 7 years.   she will not need any intervention or further follow-up imaging.    3) prediabetes:  -Her most recent A1c is 5.7% improving from 6.1%.  She did not tolerate Metformin.   - she  admits there is a room for improvement in her diet and drink choices. -  Suggestion is made for her to avoid simple carbohydrates  from her diet including Cakes, Sweet Desserts / Pastries, Ice Cream, Soda (diet and regular), Sweet Tea, Candies, Chips, Cookies, Sweet Pastries,  Store Bought Juices, Alcohol in Excess  of  1-2 drinks a day, Artificial Sweeteners, Coffee Creamer, and "Sugar-free" Products. This will help patient to have stable blood glucose profile and potentially avoid unintended weight gain.  4) mixed hyperlipidemia -She did have persistent elevation of LDL currently at 132.  She is approached for statin intervention.  She agrees to initiate Crestor 5 mg p.o. nightly.  Side effects and precautions discussed with her. - I advised her to be consistent with her omega-3 fatty acids 1000 mg total daily.   5) vitamin D deficiency: She is currently on vitamin D replacement at 5000 units daily.  She is advised to maintain close follow-up with her PMD Dr. [name].     - Time spent on this patient care encounter:  30 minutes of which 50% was spent in  counseling and the rest reviewing  her current and  previous labs / studies and medications  doses and developing a plan for long term care. Carlynn Herald  participated in the discussions, expressed understanding, and voiced agreement with the above plans.  All questions were answered to her satisfaction. she is encouraged to contact clinic should she have any questions or concerns prior to her return visit.  Follow up plan: Return in about 6 months (around 06/19/2020) for F/U with Pre-visit Labs.  Glade Lloyd, MD Phone: [phone]  Fax: [phone]  -  This note was partially dictated with voice recognition software. Similar sounding words can be transcribed inadequately or may not  be corrected upon review.  12/18/2019, 4:44 PM

## 2020-01-01 ENCOUNTER — Other Ambulatory Visit: Payer: Self-pay | Admitting: Physical Medicine and Rehabilitation

## 2020-01-01 ENCOUNTER — Ambulatory Visit
Admission: RE | Admit: 2020-01-01 | Discharge: 2020-01-01 | Disposition: A | Discharge status: Home / Self Care | Payer: 59 | Source: Ambulatory Visit | Attending: Physical Medicine and Rehabilitation | Admitting: Physical Medicine and Rehabilitation

## 2020-01-01 ENCOUNTER — Other Ambulatory Visit: Payer: Self-pay

## 2020-01-01 DIAGNOSIS — R52 Pain, unspecified: Secondary | ICD-10-CM

## 2020-01-20 ENCOUNTER — Other Ambulatory Visit: Payer: Self-pay | Admitting: Physical Medicine and Rehabilitation

## 2020-01-20 DIAGNOSIS — S83241A Other tear of medial meniscus, current injury, right knee, initial encounter: Secondary | ICD-10-CM

## 2020-01-20 DIAGNOSIS — M25561 Pain in right knee: Secondary | ICD-10-CM

## 2020-02-04 ENCOUNTER — Encounter: Payer: Self-pay | Admitting: Orthopedic Surgery

## 2020-02-04 ENCOUNTER — Other Ambulatory Visit: Payer: Self-pay

## 2020-02-04 ENCOUNTER — Ambulatory Visit (INDEPENDENT_AMBULATORY_CARE_PROVIDER_SITE_OTHER): Payer: 59 | Admitting: Orthopedic Surgery

## 2020-02-04 VITALS — BP 134/73 | HR 79 | Ht 67.0 in | Wt 180.0 lb

## 2020-02-04 DIAGNOSIS — S83241A Other tear of medial meniscus, current injury, right knee, initial encounter: Secondary | ICD-10-CM

## 2020-02-04 DIAGNOSIS — M1710 Unilateral primary osteoarthritis, unspecified knee: Secondary | ICD-10-CM

## 2020-02-04 DIAGNOSIS — M1711 Unilateral primary osteoarthritis, right knee: Secondary | ICD-10-CM

## 2020-02-04 NOTE — Progress Notes (Signed)
NEW PROBLEM//OFFICE VISIT  Chief Complaint  Patient presents with  . 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   Cardiovascular: Negative.   All other systems reviewed and are negative.    Past Medical History:  Diagnosis Date  . Abnormal uterine bleeding   . Chronic leukopenia 08/17/2016   WBC = 3.5  . DDD (degenerative disc disease), cervical   . Dyslipidemia   . Elevated serum creatinine 08/17/2016  . GERD (gastroesophageal reflux disease)   . History of esophageal dilatation    2013  . History of ovarian cyst   . Hypothyroidism   . Lactose intolerance   . OSA on CPAP    study done 04-08-2013  . Pre-diabetes   . Right thyroid nodule   . SUI (stress urinary incontinence, female)   . Tonsillar exudate 08/13/2014  . Wears glasses     Past Surgical History:  Procedure Laterality Date  . CESAREAN SECTION  1997   Twins-1 twin del.by c/s, 1 del.vaginally  . COLONOSCOPY  04/04/2011   Internal hemorrhoids/Nl colonoscopy-SLIGHTLY TORTUOUS COLON  . DILATATION & CURETTAGE/HYSTEROSCOPY WITH MYOSURE N/A 08/24/2016   Procedure:  DILATATION & CURETTAGE/HYSTEROSCOPY WITH MYOSURE Possible Myosure ;  Surgeon: Nunzio Cobbs, MD;  Location: [facility];  Service: Gynecology;  Laterality: N/A;  . DILATION AND CURETTAGE OF UTERUS  1998   Seconday to miscarriage  . ESOPHAGOGASTRODUODENOSCOPY  09/22/2011   Mild gastritis/Hiatal hernia/DYSPHAGIA DUE TO PEPTIC STRICTURE/UNCONTROLLED GERD  . KNEE ARTHROSCOPY Right 2003  . MALONEY DILATION  09/22/2011   Procedure: MALONEY DILATION;  Surgeon: Danie Binder, MD;  [facility];  Service: Endoscopy;  Laterality: N/A;  . SAVORY DILATION  09/22/2011   Procedure: SAVORY DILATION;  Surgeon: Danie Binder, MD;  Location[facility];  Service: Endoscopy;  Laterality: N/A;    Family History  Problem Relation Age of Onset  . Hypertension Mother   . Angina Mother        MVP. h/o intestinal polyps possibly  . Hyperlipidemia Mother   . Transient ischemic attack Mother   . Hypertension Father   . Hyperlipidemia Father        glucose intolerant  . Diabetes Father   . GER disease Brother   . Diabetes Brother   . Diabetes Maternal Grandfather   . Hyperlipidemia Paternal Grandmother   . Thyroid disease Paternal Grandmother   . Hyperlipidemia Paternal Grandfather   . Thyroid disease Brother        hyperthyroid  . Colon cancer Neg Hx   . Breast cancer Neg  Hx   . Ovarian cancer Neg Hx   . Anesthesia problems Neg Hx    Social History   Tobacco Use  . Smoking status: Never Smoker  . Smokeless tobacco: Never Used  Vaping Use  . Vaping Use: Never used  Substance Use Topics  . Alcohol use: No  . Drug use: No    Allergies  Allergen Reactions  . Lactose Intolerance (Gi)     Vomiting, diarrhea, bloating    Current Meds  Medication Sig  . Cholecalciferol (VITAMIN D-3) 125 MCG (5000 UT) TABS Take 1 capsule by mouth daily.  Marland Kitchen levothyroxine (SYNTHROID) 150 MCG tablet Take 1 tablet (150 mcg total) by mouth daily before breakfast.  . Multiple Vitamin (MULTIVITAMIN)  tablet Take 1 tablet by mouth daily.  Marland Kitchen omeprazole (PRILOSEC) 40 MG capsule Take 40 mg by mouth daily.    BP 134/73   Pulse 79   Ht 5\' 7"  (1.702 m)   Wt 180 lb (81.6 kg)   LMP 09/20/2016 (Approximate)   BMI 28.19 kg/m   Physical Exam Constitutional:      General: She is not in acute distress.    Appearance: She is well-developed.  Cardiovascular:     Comments: No peripheral edema Skin:    General: Skin is warm and dry.  Neurological:     Mental Status: She is alert and oriented to person, place, and time.     Sensory: No sensory deficit.     Coordination: Coordination normal.     Gait: Gait abnormal.     Deep Tendon Reflexes: Reflexes are normal and symmetric.     Ortho Exam  Compared to the right knee the left knee appears to have an effusion looks swollen.  She has good extension with pain in the last 10 degrees flexes knee well has tenderness on the medial joint line some of the peripatellar region  Lateral compartment and collateral ligaments cruciate ligaments feel fine muscle tone and strength are normal skin is intact without erythema  She did have a positive McMurray's sign for medial pathology  MEDICAL DECISION MAKING  A.  Encounter Diagnoses  Name Primary?  . Primary localized osteoarthritis of knee right Yes  . Acute medial meniscus tear of right knee, initial encounter     B. 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    C. MANAGEMENT   Procedure aspiration right knee joint  Verbal consent  was obtained to aspirate the right knee joint   Timeout was completed to confirm the site of aspiration   An 18-gauge needle was used to aspirate the knee joint from a suprapatellar lateral approach.  Anesthesia was provided by ethyl chloride and the skin was prepped with alcohol.  After cleaning the skin with alcohol an 18-gauge needle was used to aspirate the right knee joint.  We obtained 5  cc of fluid clear  There were no complications. A sterile bandage was applied.    No orders of the defined types were placed in this encounter.  Arther Abbott, MD  02/04/2020 12:42 PM

## 2020-02-04 NOTE — Patient Instructions (Addendum)
Ice the knee   Comfort as tolerated   Consider PRP injection before surgery   Continue nsaids ibuprofen or ketoralac   Call office to let us know your decision: surgery or prp   If you consider knee arthroscopy :   Meniscus Tear Surgery A meniscus is a wedge-shaped piece of cartilage inside the knee. Surgery for a meniscus tear is a procedure to treat a torn meniscus. The procedure is done either to remove the torn part of the meniscus or to repair the meniscus with stitches (sutures). Tell a health care provider about:  Any allergies you have.  All medicines you are taking, including vitamins, herbs, eye drops, creams, and over-the-counter medicines.  Any problems you or family members have had with anesthetic medicines.  Any blood disorders you have.  Any surgeries you have had.  Any medical conditions you have or have had.  Whether you are pregnant or may be pregnant. What are the risks? Generally, this is a safe procedure. However, problems may occur, including:  Bleeding.  Infection.  Allergic reactions to medicines, including anesthetics.  Damage to nerves, blood vessels, or other structures of the knee.  Pain that does not go away, or pain or stiffness after surgery.  A blood clot that forms in a leg and travels to the lungs (pulmonary embolism). What happens before the procedure? Staying hydrated Follow instructions from your health care provider about hydration, which may include:  Up to 2 hours before the procedure - you may continue to drink clear liquids, such as water, clear fruit juice, black coffee, and plain tea. Eating and drinking Follow instructions from your health care provider about eating and drinking, which may include:  8 hours before the procedure - stop eating heavy meals or foods, such as meat, fried foods, or fatty foods.  6 hours before the procedure - stop eating light meals or foods, such as toast or cereal.  6 hours before the  procedure - stop drinking milk or drinks that contain milk.  2 hours before the procedure - stop drinking clear liquids. Medicines Ask your health care provider about:  Changing or stopping your regular medicines. This is especially important if you are taking diabetes medicines or blood thinners.  Taking medicines such as aspirin and ibuprofen. These medicines can thin your blood. Do not take these medicines unless your health care provider tells you to take them.  Taking over-the-counter medicines, vitamins, herbs, and supplements. General instructions  Do not use any products that contain nicotine or tobacco for at least 4 weeks before the procedure. These products include cigarettes, e-cigarettes, and chewing tobacco. If you need help quitting, ask your health care provider.  Plan to have someone take you home from the hospital or clinic.  If you will be going home right after the procedure, plan to have someone with you for 24 hours.  Ask your health care provider: ? How your surgery site will be marked or identified. ? What steps will be taken to help prevent infection. These may include:  Removing hair at the surgery site.  Washing skin with a germ-killing soap.  Taking antibiotic medicine. What happens during the procedure?   An IV will be inserted into one of your veins.  You will be given one or more of the following: ? A medicine to help you relax (sedative). ? A medicine to make you fall asleep (general anesthetic). ? A medicine that is injected into an area of your body to numb  everything below and slightly above the injection site (regional anesthetic).  A cuff may be placed around your upper leg to slow blood flow to your lower leg during the procedure.  Your surgeon will make small incisions around your knee.  A clear, germ-free fluid will be injected into your knee through one incision to help your surgeon see inside the joint.  A thin, flexible, tube-like  instrument with a camera on the end (arthroscope) will be inserted through another incision. The camera will send images to a screen in the operating room. Your surgeon will use these images as a guide during surgery.  Surgical instruments will be inserted through the other incisions.  The torn pieces of your meniscus will be removed, or the edges of the tear will be repaired with sutures.  The instruments and the scope will be removed.  Your knee joint will be flushed out with fluid.  The incisions will be closed using small sutures or adhesive strips.  A bandage (dressing) will be placed over your knee, and another dressing will be wrapped around your knee. The procedure may vary among health care providers and hospitals. What happens after the procedure?  Your blood pressure, heart rate, breathing rate, and blood oxygen level will be monitored until you leave the hospital or clinic.  You will be given medicine to control pain.  You may be fitted with a knee brace and given crutches or a walker.  You may be shown how to do physical therapy exercises to help you recover.  Do not drive for 24 hours if you were given a sedative during your procedure. Summary  A meniscus is a wedge-shaped piece of cartilage inside the knee. Surgery for a meniscus tear is a procedure to treat a torn meniscus.  Follow instructions from your health care provider about eating and drinking before the procedure.  Follow instructions from your health care provider about changing or stopping your regular medicines. This is especially important if you are taking diabetes medicines or blood thinners.  After the procedure a bandage (dressing) will be placed over your knee. Another dressing will be wrapped around your knee.  You will be given medicine to control pain. You may be fitted with a knee brace and given crutches or a walker. This information is not intended to replace advice given to you by your  health care provider. Make sure you discuss any questions you have with your health care provider. Document Revised: 01/07/2018 Document Reviewed: 01/07/2018 Elsevier Patient Education  [place].

## 2020-02-11 IMAGING — MG DIGITAL SCREENING BILAT W/ TOMO W/ CAD
8 series · 8 of 24 positions shown · non-contrast
Comparison: Previous exam(s).

CLINICAL DATA: Screening.

EXAM:
DIGITAL SCREENING BILATERAL MAMMOGRAM WITH TOMO AND CAD

[L MLO synth-2D]
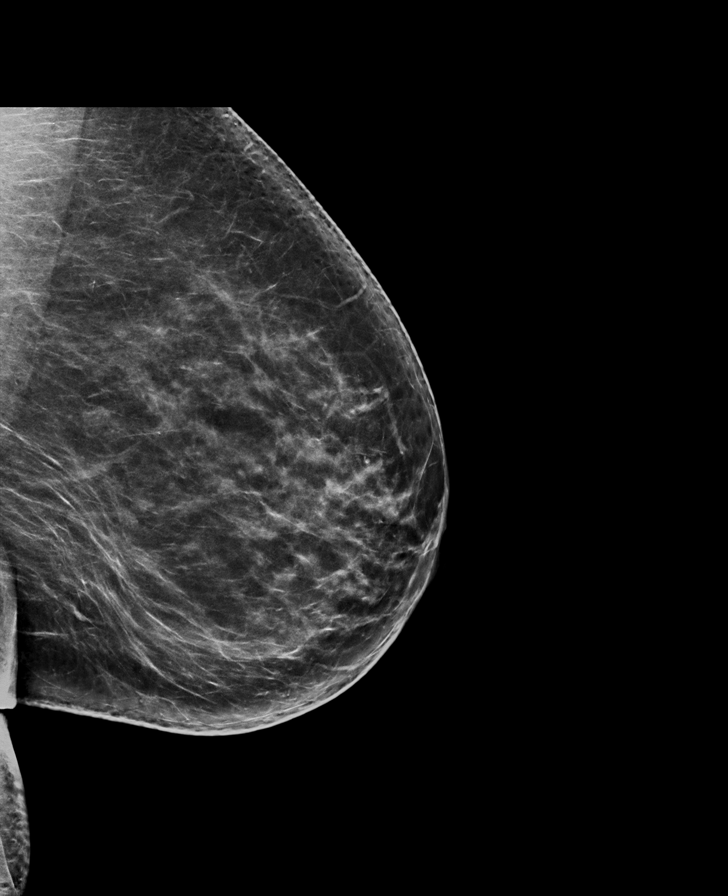

[L CC synth-2D]
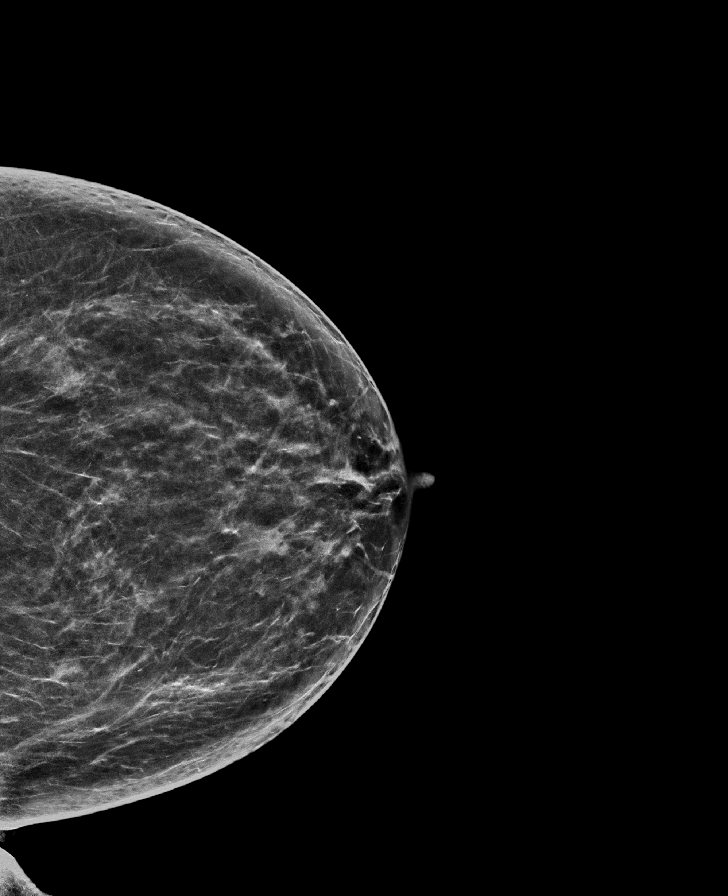

[R MLO synth-2D]
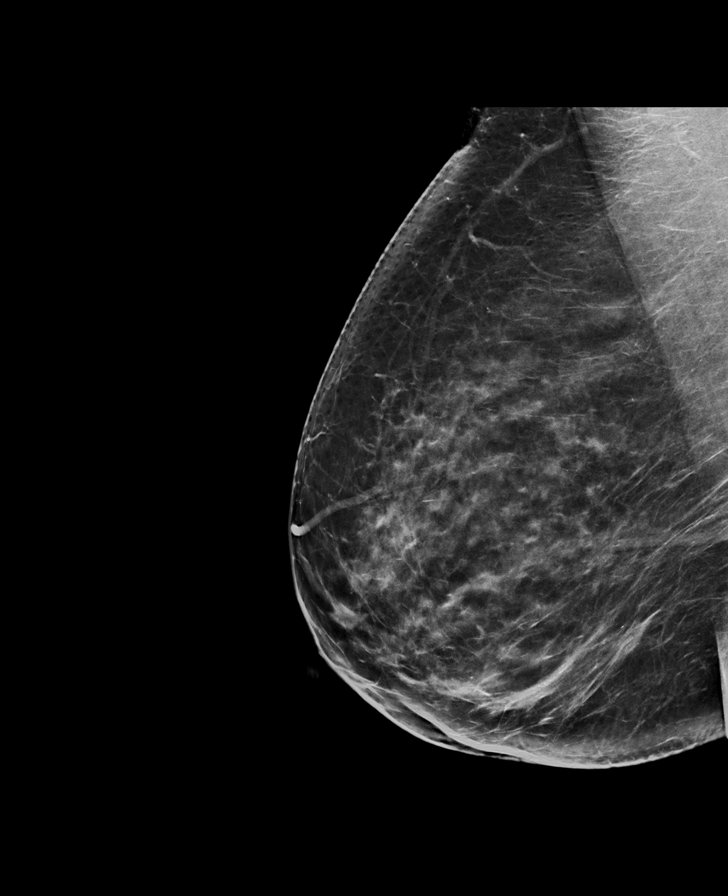

[R CC synth-2D]
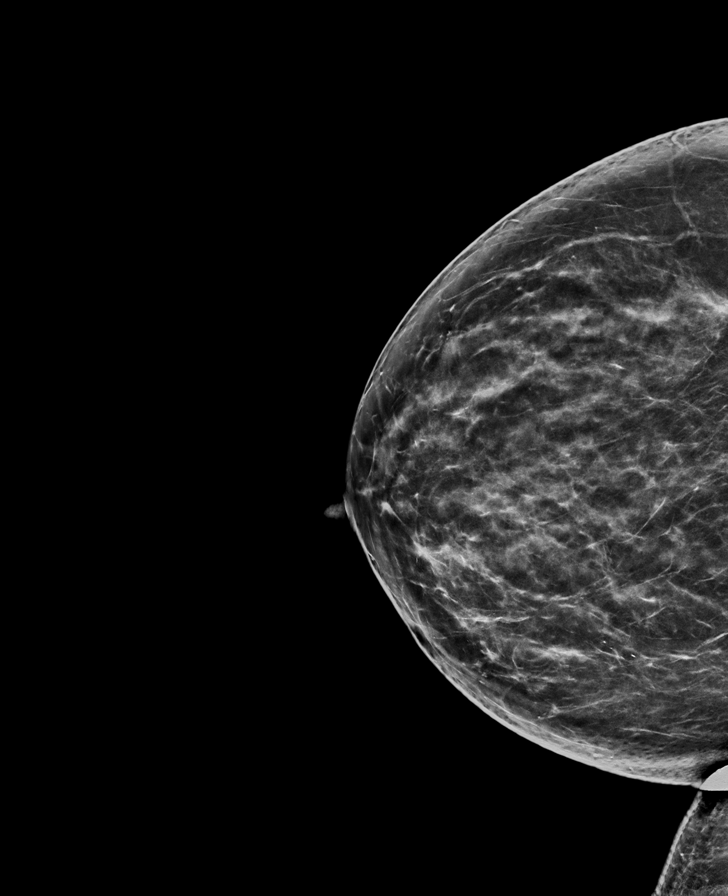

[R CC tomo · tomo slice 38/75.0]
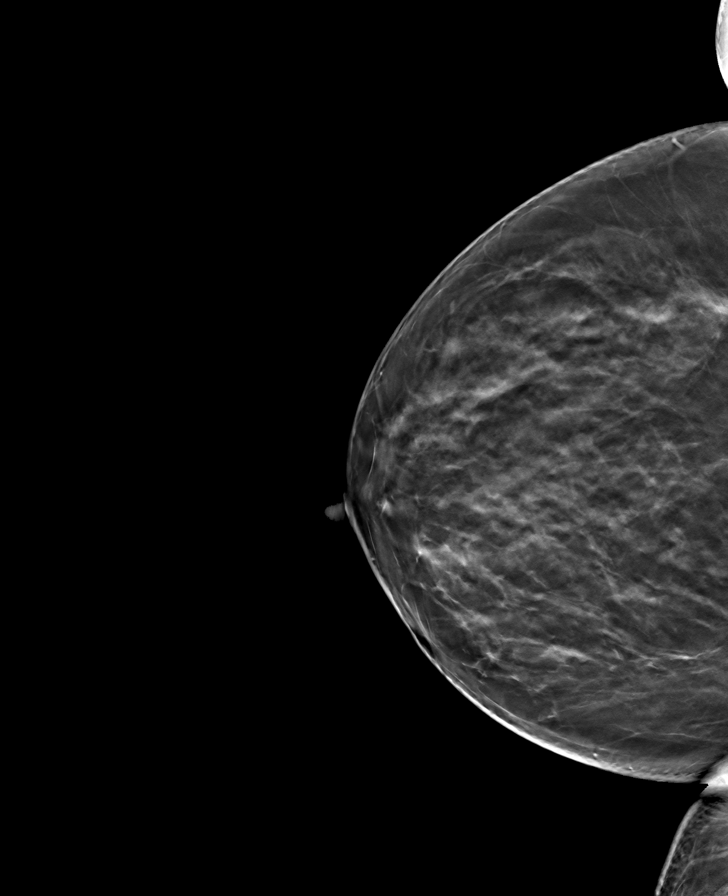

[L MLO tomo · tomo slice 41/81.0]
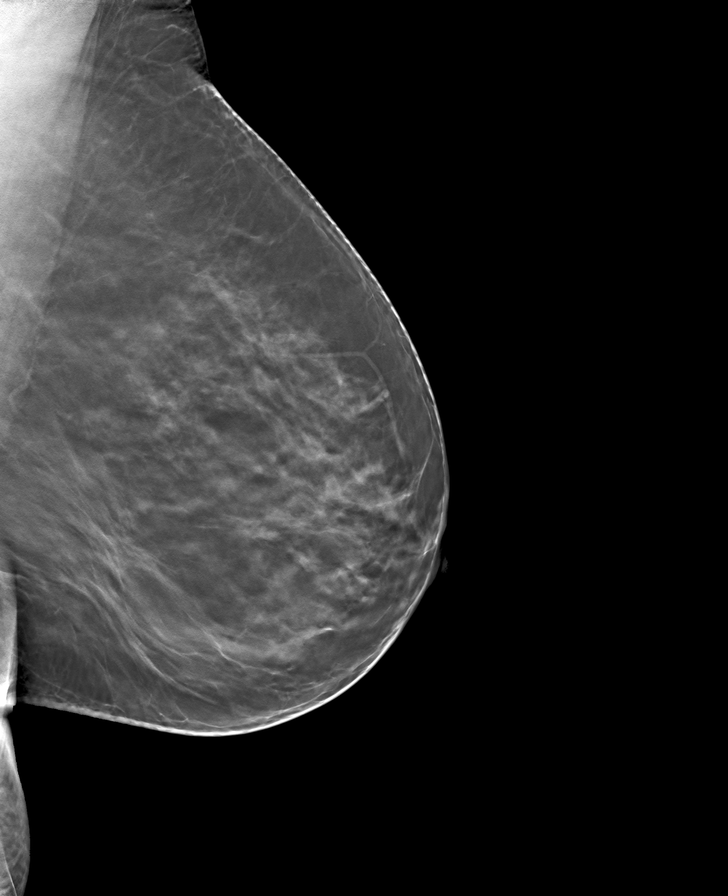

[R MLO tomo · tomo slice 42/83.0]
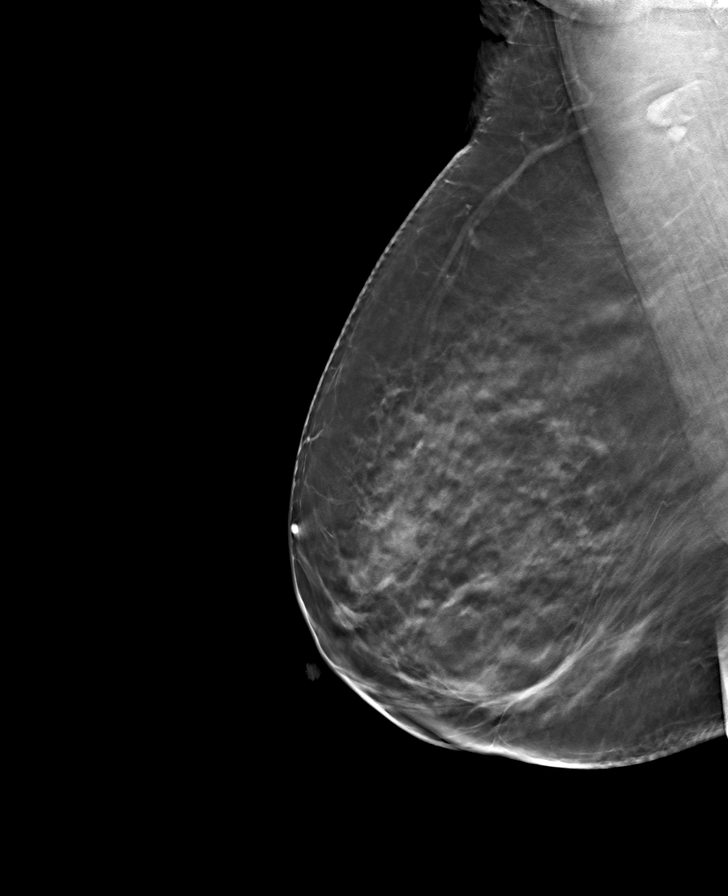

[L CC tomo · tomo slice 38/75.0]
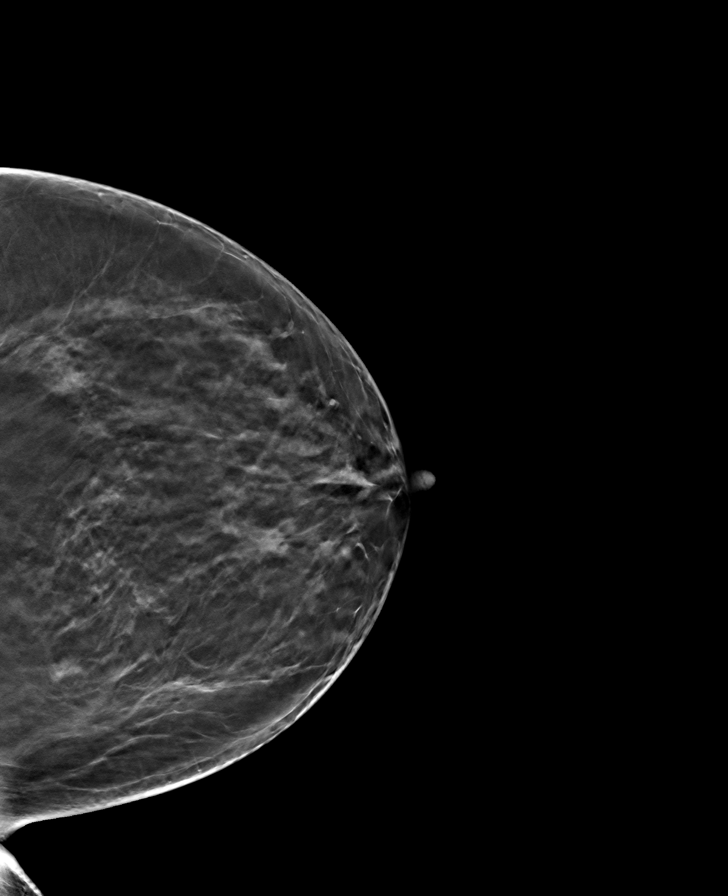

[8 of 24 positions shown; findings below may reference images not displayed]

ACR Breast Density Category c: The breast tissue is heterogeneously
dense, which may obscure small masses.
FINDINGS: There are no findings suspicious for malignancy. Images were
processed with CAD.
IMPRESSION: No mammographic evidence of malignancy. A result letter of this
screening mammogram will be mailed directly to the patient.

RECOMMENDATION:
Screening mammogram in one year. (Code:[id number]-[id number])

BI-RADS CATEGORY  1: Negative.

## 2020-02-14 ENCOUNTER — Other Ambulatory Visit: Payer: 59

## 2020-02-16 IMAGING — US US THYROID
1 series · 13 of 25 positions shown · non-contrast
Comparison: 09/30/2015

CLINICAL DATA: Multinodular goiter.

EXAM:
THYROID ULTRASOUND
TECHNIQUE: Ultrasound examination of the thyroid gland and adjacent soft
tissues was performed.

[Series 1: us thyroid · 0.06mm/px · 13 of 40 slices shown]
[im 1/40]
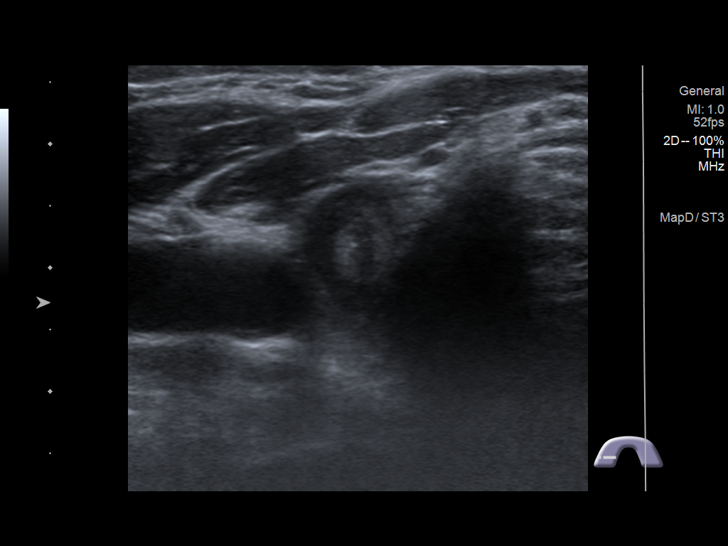
[im 4/40]
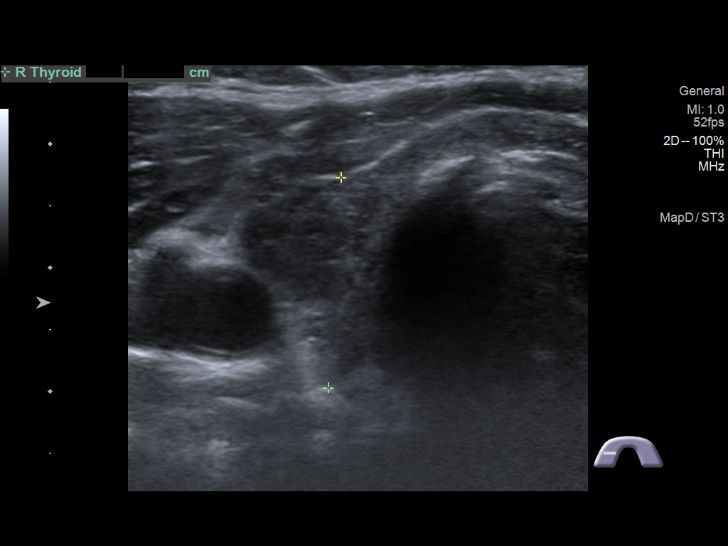
[im 7/40]
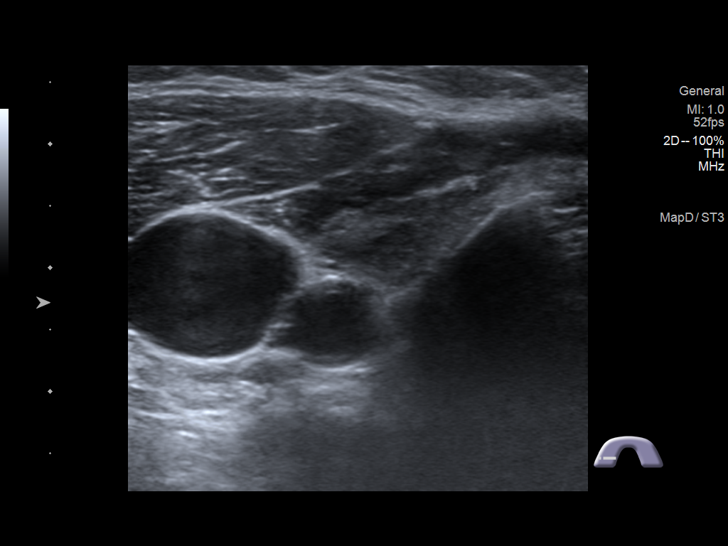
[im 10/40]
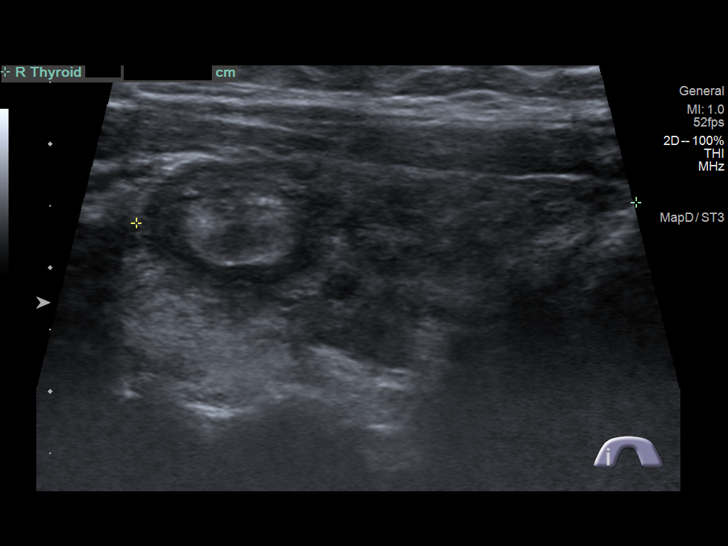
[im 14/40]
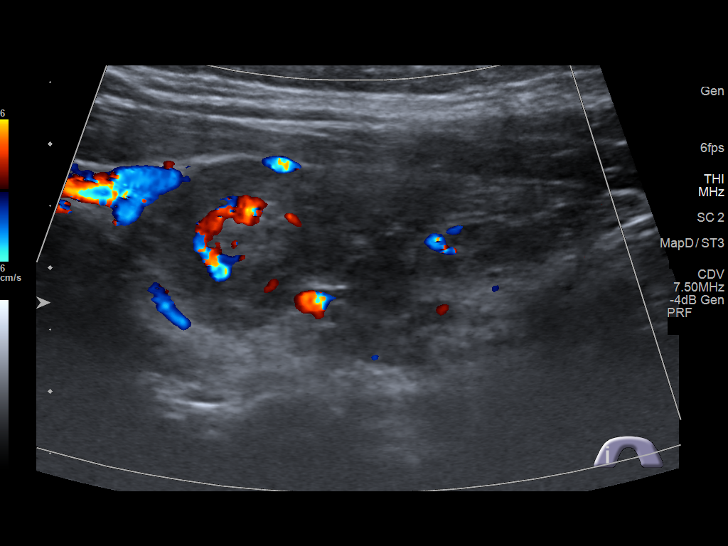
[im 17/40]
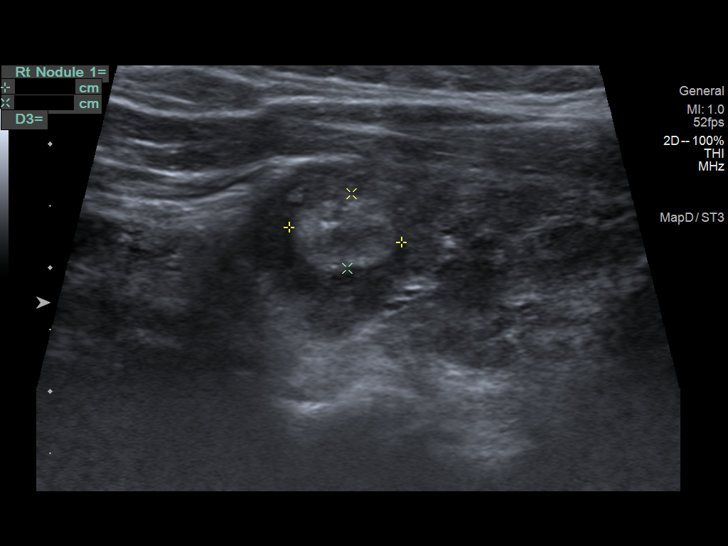
[im 20/40]
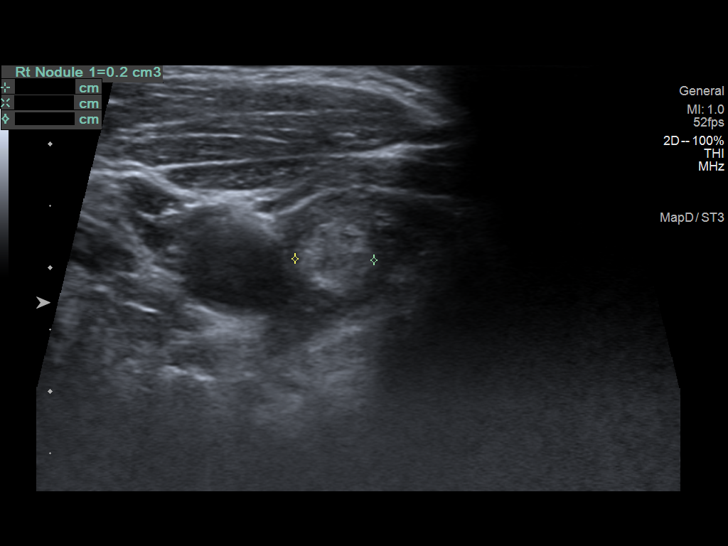
[im 23/40]
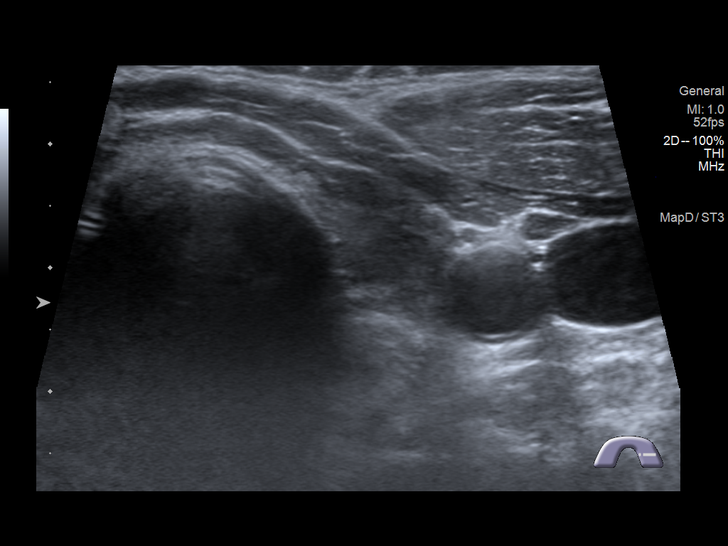
[im 27/40]
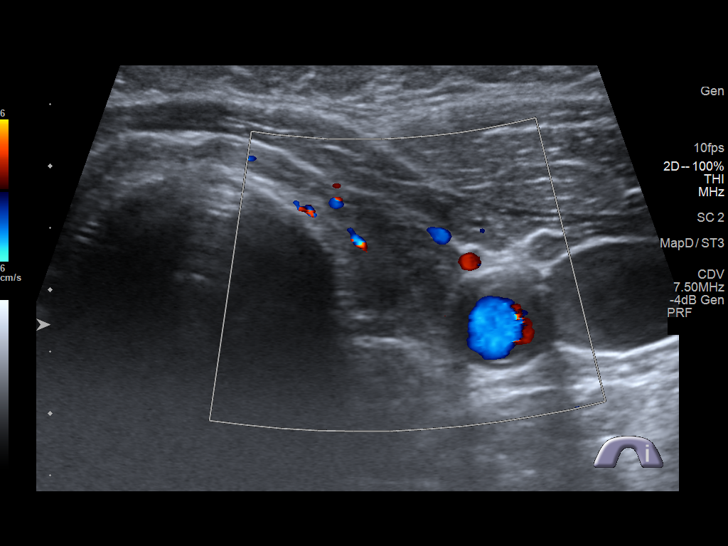
[im 30/40]
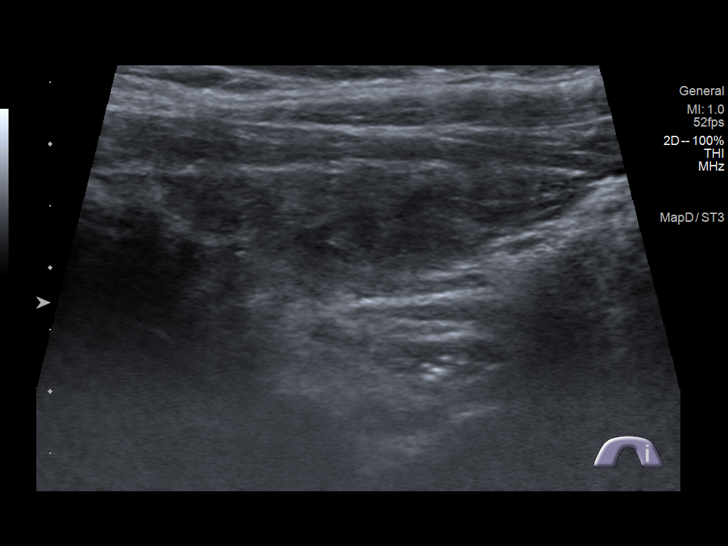
[im 33/40]
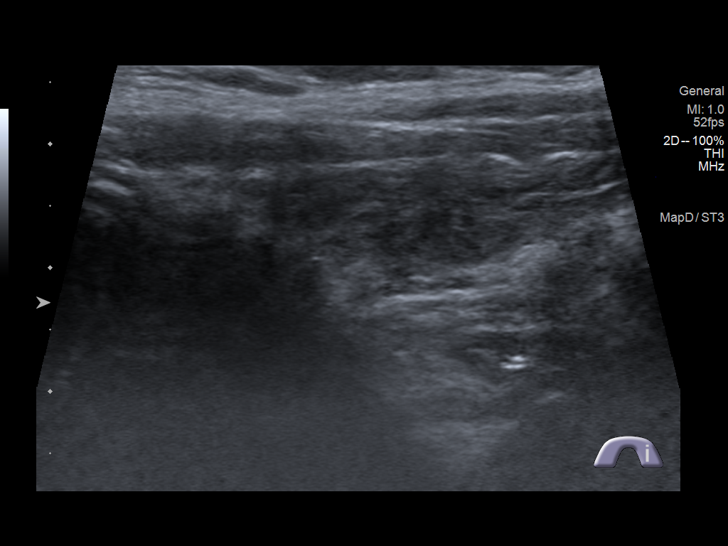
[im 36/40]
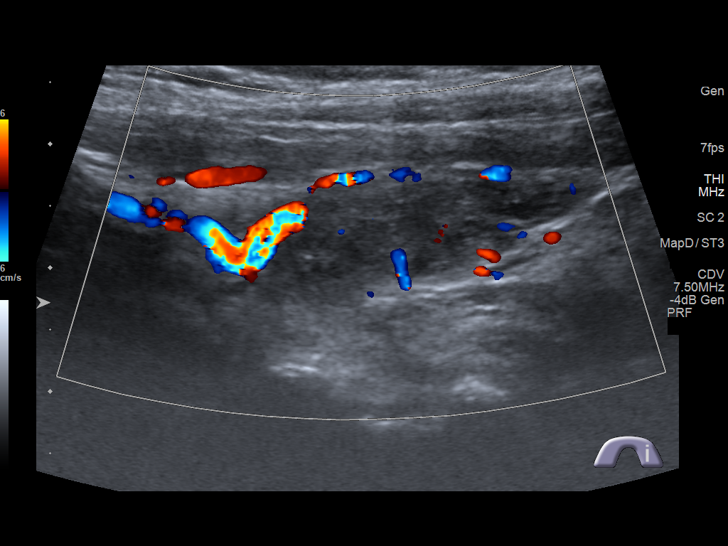
[im 40/40]
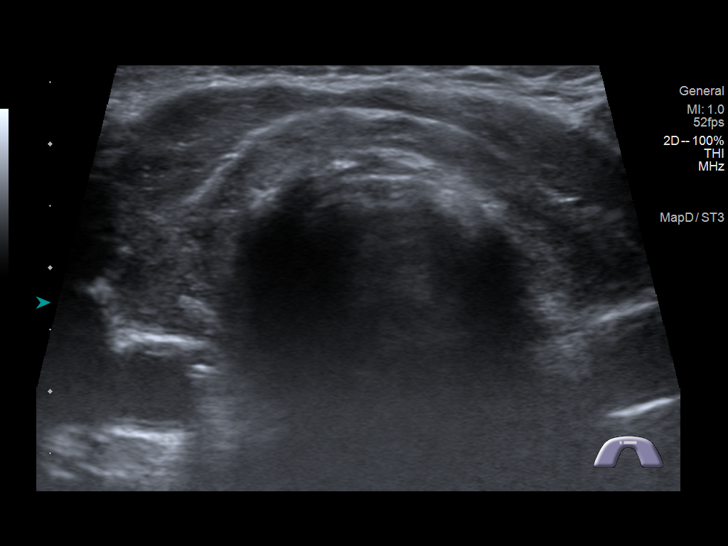

[13 of 25 positions shown; findings below may reference images not displayed]

FINDINGS: Parenchymal Echotexture: Markedly heterogenous

Isthmus: 0.3 cm

Right lobe: 4 x 1.4 x 1.7 cm

Left lobe: 3.6 x 1.2 x 1.4 cm

_________________________________________________________

Estimated total number of nodules >/= 1 cm: 1

Number of spongiform nodules >/=  2 cm not described below (TR1): 0

Number of mixed cystic and solid nodules >/= 1.5 cm not described
below (TR2): 0

_________________________________________________________

Nodule # 1:

Prior biopsy: No

Location: Right; Superior

Maximum size: 1 cm; Other 2 dimensions: 0.6 x 0.6 cm, previously,
1.1 x 0.8 x 1 cm

Composition: solid/almost completely solid (2)

Echogenicity: hyperechoic (1)

Shape: not taller-than-wide (0)

Margins: smooth (0)

Echogenic foci: none (0)

ACR TI-RADS total points: 3.

ACR TI-RADS risk category:  TR3 (3 points).

Significant change in size (>/= 20% in two dimensions and minimal
increase of 2 mm): No

Change in features: No

Change in ACR TI-RADS risk category: No

ACR TI-RADS recommendations:

Given size (<1.4 cm) and appearance, this nodule does NOT meet
TI-RADS criteria for biopsy or dedicated follow-up.

_________________________________________________________

There are no pathologically enlarged lymph nodes.
IMPRESSION: 1. Markedly heterogeneous, normal sized thyroid gland.
2. Stable TR 3 thyroid nodule in the right superior thyroid gland.
This thyroid nodule does not meet criteria for future follow-up or
FNA. This thyroid nodule is essentially stable since 8804 consistent
with a benign process.

The above is in keeping with the ACR TI-RADS recommendations - [HOSPITAL] 7969;[DATE].

## 2020-02-22 ENCOUNTER — Other Ambulatory Visit: Payer: Self-pay | Admitting: "Endocrinology

## 2020-04-07 ENCOUNTER — Other Ambulatory Visit: Payer: Self-pay

## 2020-04-07 ENCOUNTER — Ambulatory Visit
Admission: RE | Admit: 2020-04-07 | Discharge: 2020-04-07 | Disposition: A | Discharge status: Home / Self Care | Payer: 59 | Source: Ambulatory Visit | Attending: Physical Medicine and Rehabilitation | Admitting: Physical Medicine and Rehabilitation

## 2020-04-07 DIAGNOSIS — S83241A Other tear of medial meniscus, current injury, right knee, initial encounter: Secondary | ICD-10-CM

## 2020-04-07 DIAGNOSIS — M25561 Pain in right knee: Secondary | ICD-10-CM

## 2020-04-24 HISTORY — PX: REPLACEMENT TOTAL KNEE: SUR1224

## 2020-05-03 ENCOUNTER — Other Ambulatory Visit: Payer: 59

## 2020-05-14 ENCOUNTER — Encounter: Payer: Self-pay | Admitting: Family Medicine

## 2020-05-19 ENCOUNTER — Other Ambulatory Visit: Payer: Self-pay

## 2020-05-19 ENCOUNTER — Encounter: Payer: Self-pay | Admitting: Internal Medicine

## 2020-05-19 ENCOUNTER — Ambulatory Visit (INDEPENDENT_AMBULATORY_CARE_PROVIDER_SITE_OTHER): Payer: 59 | Admitting: Internal Medicine

## 2020-05-19 VITALS — BP 138/75 | HR 72 | Temp 98.2°F | Resp 18 | Ht 67.0 in | Wt 175.0 lb

## 2020-05-19 DIAGNOSIS — R7303 Prediabetes: Secondary | ICD-10-CM

## 2020-05-19 DIAGNOSIS — M25571 Pain in right ankle and joints of right foot: Secondary | ICD-10-CM | POA: Diagnosis not present

## 2020-05-19 DIAGNOSIS — Z01818 Encounter for other preprocedural examination: Secondary | ICD-10-CM | POA: Diagnosis not present

## 2020-05-19 NOTE — Progress Notes (Signed)
Established Patient Office Visit  Subjective:  Patient ID: Stacy [name], female    DOB: 09-22-1960  Age: 60 y.o. MRN: [id number]  CC:  Chief Complaint  Patient presents with  . Pre-op Exam    R knee surgery - pending medical clearance.     HPI Stacy [name] is a 60 year old female with PMH of hypothyroidism and GERD who presents for preoperative evaluation for knee arthroplasty.  She has been in good health overall. She denies any active complaints - headache, dizziness, chest pain, dyspnea or palpitations.  She has h/o right knee arthritis and has tried intraarticular injections. She is planning to get right TKA soon.  Past Medical History:  Diagnosis Date  . Abnormal uterine bleeding   . Chronic leukopenia 08/17/2016   WBC = 3.5  . DDD (degenerative disc disease), cervical   . Dyslipidemia   . Elevated serum creatinine 08/17/2016  . GERD (gastroesophageal reflux disease)   . History of esophageal dilatation    2013  . History of ovarian cyst   . Hypothyroidism   . Lactose intolerance   . OSA on CPAP    study done 04-08-2013  . Pre-diabetes   . Right thyroid nodule   . SUI (stress urinary incontinence, female)   . Tonsillar exudate 08/13/2014  . Wears glasses     Past Surgical History:  Procedure Laterality Date  . CESAREAN SECTION  1997   Twins-1 twin del.by c/s, 1 del.vaginally  . COLONOSCOPY  04/04/2011   Internal hemorrhoids/Nl colonoscopy-SLIGHTLY TORTUOUS COLON  . DILATATION & CURETTAGE/HYSTEROSCOPY WITH MYOSURE N/A 08/24/2016   Procedure: DILATATION & CURETTAGE/HYSTEROSCOPY WITH MYOSURE Possible Myosure ;  Surgeon: Nunzio Cobbs, MD;  Location: [facility];  Service: Gynecology;  Laterality: N/A;  . DILATION AND CURETTAGE OF UTERUS  1998   Seconday to miscarriage  . ESOPHAGOGASTRODUODENOSCOPY  09/22/2011   Mild gastritis/Hiatal hernia/DYSPHAGIA DUE TO PEPTIC STRICTURE/UNCONTROLLED GERD  . KNEE ARTHROSCOPY Right 2003  . MALONEY DILATION   09/22/2011   Procedure: MALONEY DILATION;  Surgeon: Danie Binder, MD;  Location: [facility];  Service: Endoscopy;  Laterality: N/A;  . SAVORY DILATION  09/22/2011   Procedure: SAVORY DILATION;  Surgeon: Danie Binder, MD;  Location: [facility];  Service: Endoscopy;  Laterality: N/A;    Family History  Problem Relation Age of Onset  . Hypertension Mother   . Angina Mother        MVP. h/o intestinal polyps possibly  . Hyperlipidemia Mother   . Transient ischemic attack Mother   . Hypertension Father   . Hyperlipidemia Father        glucose intolerant  . Diabetes Father   . GER disease Brother   . Diabetes Brother   . Diabetes Maternal Grandfather   . Hyperlipidemia Paternal Grandmother   . Thyroid disease Paternal Grandmother   . Hyperlipidemia Paternal Grandfather   . Thyroid disease Brother        hyperthyroid  . Colon cancer Neg Hx   . Breast cancer Neg Hx   . Ovarian cancer Neg Hx   . Anesthesia problems Neg Hx     Social History   Socioeconomic History  . Marital status: Married    Spouse name: Not on file  . Number of children: Not on file  . Years of education: Not on file  . Highest education level: Not on file  Occupational History  . Occupation: Full time: [employer]  Tobacco Use  . Smoking status:  Never Smoker  . Smokeless tobacco: Never Used  Vaping Use  . Vaping Use: Never used  Substance and Sexual Activity  . Alcohol use: No  . Drug use: No  . Sexual activity: Yes    Partners: Male    Birth control/protection: Other-see comments    Comment: husband with vasectomy  Other Topics Concern  . Not on file  Social History Narrative   Married to [name], neurology   Regular exercise: No   Social Determinants of Health   Financial Resource Strain: Not on file  Food Insecurity: Not on file  Transportation Needs: Not on file  Physical Activity: Not on file  Stress: Not on file  Social Connections: Not on file  Intimate Partner Violence: Not  on file    Outpatient Medications Prior to Visit  Medication Sig Dispense Refill  . Cholecalciferol (VITAMIN D-3) 125 MCG (5000 UT) TABS Take 1 capsule by mouth daily. 60 tablet 0  . indomethacin (INDOCIN) 50 MG capsule Take 50 mg by mouth 3 (three) times daily with meals.    Marland Kitchen levothyroxine (SYNTHROID) 150 MCG tablet TAKE 1 TABLET(150 MCG) BY MOUTH DAILY BEFORE BREAKFAST 90 tablet 1  . Multiple Vitamin (MULTIVITAMIN) tablet Take 1 tablet by mouth daily.    Marland Kitchen omeprazole (PRILOSEC) 40 MG capsule Take 40 mg by mouth daily.     No facility-administered medications prior to visit.    Allergies  Allergen Reactions  . Lactose Intolerance (Gi)     Vomiting, diarrhea, bloating    ROS Review of Systems  Constitutional: Negative for chills and fever.  HENT: Negative for congestion, sinus pressure, sinus pain and sore throat.   Eyes: Negative for pain and discharge.  Respiratory: Negative for cough and shortness of breath.   Cardiovascular: Negative for chest pain and palpitations.  Gastrointestinal: Negative for abdominal pain, constipation, diarrhea, nausea and vomiting.  Endocrine: Negative for polydipsia and polyuria.  Genitourinary: Negative for dysuria and hematuria.  Musculoskeletal: Positive for arthralgias (Right knee). Negative for neck pain and neck stiffness.  Skin: Negative for rash.  Neurological: Negative for dizziness and weakness.  Psychiatric/Behavioral: Negative for agitation and behavioral problems.      Objective:    Physical Exam Vitals reviewed.  Constitutional:      General: She is not in acute distress.    Appearance: She is not diaphoretic.  HENT:     Head: Normocephalic and atraumatic.     Nose: Nose normal. No congestion.     Mouth/Throat:     Mouth: Mucous membranes are moist.     Pharynx: No posterior oropharyngeal erythema.  Eyes:     General: No scleral icterus.    Extraocular Movements: Extraocular movements intact.     Pupils: Pupils are  equal, round, and reactive to light.  Cardiovascular:     Rate and Rhythm: Normal rate and regular rhythm.     Pulses: Normal pulses.     Heart sounds: Normal heart sounds. No murmur heard.   Pulmonary:     Breath sounds: Normal breath sounds. No wheezing or rales.  Abdominal:     Palpations: Abdomen is soft.     Tenderness: There is no abdominal tenderness.  Musculoskeletal:     Cervical back: Neck supple. No tenderness.     Right lower leg: No edema.     Left lower leg: No edema.  Skin:    General: Skin is warm.     Findings: No rash.  Neurological:  General: No focal deficit present.     Mental Status: She is alert and oriented to person, place, and time.     Sensory: No sensory deficit.     Motor: No weakness.  Psychiatric:        Mood and Affect: Mood normal.        Behavior: Behavior normal.     BP 138/75   Pulse 72   Temp 98.2 F (36.8 C)   Resp 18   Ht 5\' 7"  (1.702 m)   Wt 175 lb (79.4 kg)   LMP 09/20/2016 (Approximate)   SpO2 95%   BMI 27.41 kg/m  Wt Readings from Last 3 Encounters:  05/19/20 175 lb (79.4 kg)  02/04/20 180 lb (81.6 kg)  12/18/19 185 lb 6.4 oz (84.1 kg)     Health Maintenance Due  Topic Date Due  . PAP SMEAR-Modifier  07/15/2019  . INFLUENZA VACCINE  11/23/2019  . COVID-19 Vaccine (3 - Booster) 01/28/2020    There are no preventive care reminders to display for this patient.  Lab Results  Component Value Date   TSH 0.589 12/11/2019   Lab Results  Component Value Date   WBC 3.8 12/11/2019   HGB 12.5 12/11/2019   HCT 37.5 12/11/2019   MCV 88 12/11/2019   PLT 169 12/11/2019   Lab Results  Component Value Date   NA 143 12/11/2019   K 4.1 12/11/2019   CO2 27 12/11/2019   GLUCOSE 82 12/11/2019   BUN 15 12/11/2019   CREATININE 1.15 (H) 12/11/2019   BILITOT 0.5 12/11/2019   ALKPHOS 85 12/11/2019   AST 25 12/11/2019   ALT 30 12/11/2019   PROT 7.5 12/11/2019   ALBUMIN 4.4 12/11/2019   CALCIUM 9.1 12/11/2019    ANIONGAP 7 08/17/2016   Lab Results  Component Value Date   CHOL 204 (H) 12/11/2019   Lab Results  Component Value Date   HDL 48 12/11/2019   Lab Results  Component Value Date   LDLCALC 132 (H) 12/11/2019   Lab Results  Component Value Date   TRIG 132 12/11/2019   Lab Results  Component Value Date   CHOLHDL 4.3 12/11/2019   Lab Results  Component Value Date   HGBA1C 5.7 (A) 12/18/2019      Assessment & Plan:   Right knee arthritis Preoperative evaluation Planning to get Right TKA Awaiting preop evaluation form from Orthopedic surgery On Ibuprofen PRN BP and HR wnl EKG: Sinus rhythm. HR 58. No signs of active ischemia. Patient is medically optimized for Right TKA.  Prediabetes HbA1C: 5.8 Diet controlled    No orders of the defined types were placed in this encounter.   Follow-up: Return if symptoms worsen or fail to improve.    Lindell Spar, MD

## 2020-05-19 NOTE — Patient Instructions (Signed)
Please continue taking medications as prescribed.  Please contact your Orthopedic surgeon for preoperative evaluation form and have them fax it to [place]. We will order blood tests based on their specific requirements.

## 2020-05-31 ENCOUNTER — Ambulatory Visit (INDEPENDENT_AMBULATORY_CARE_PROVIDER_SITE_OTHER): Payer: 59 | Admitting: Orthopedic Surgery

## 2020-05-31 ENCOUNTER — Encounter: Payer: Self-pay | Admitting: Orthopedic Surgery

## 2020-05-31 ENCOUNTER — Telehealth (INDEPENDENT_AMBULATORY_CARE_PROVIDER_SITE_OTHER): Payer: 59 | Admitting: Family Medicine

## 2020-05-31 ENCOUNTER — Telehealth: Payer: Self-pay | Admitting: Radiology

## 2020-05-31 ENCOUNTER — Other Ambulatory Visit: Payer: Self-pay

## 2020-05-31 ENCOUNTER — Encounter: Payer: Self-pay | Admitting: Family Medicine

## 2020-05-31 VITALS — BP 126/60 | Ht 67.0 in | Wt 187.0 lb

## 2020-05-31 VITALS — BP 133/85 | HR 76 | Ht 67.0 in | Wt 186.4 lb

## 2020-05-31 DIAGNOSIS — E663 Overweight: Secondary | ICD-10-CM

## 2020-05-31 DIAGNOSIS — M1711 Unilateral primary osteoarthritis, right knee: Secondary | ICD-10-CM | POA: Diagnosis not present

## 2020-05-31 DIAGNOSIS — Z01818 Encounter for other preprocedural examination: Secondary | ICD-10-CM | POA: Diagnosis not present

## 2020-05-31 DIAGNOSIS — E039 Hypothyroidism, unspecified: Secondary | ICD-10-CM

## 2020-05-31 DIAGNOSIS — R7989 Other specified abnormal findings of blood chemistry: Secondary | ICD-10-CM

## 2020-05-31 DIAGNOSIS — M23321 Other meniscus derangements, posterior horn of medial meniscus, right knee: Secondary | ICD-10-CM

## 2020-05-31 DIAGNOSIS — D649 Anemia, unspecified: Secondary | ICD-10-CM

## 2020-05-31 DIAGNOSIS — M1710 Unilateral primary osteoarthritis, unspecified knee: Secondary | ICD-10-CM

## 2020-05-31 DIAGNOSIS — K219 Gastro-esophageal reflux disease without esophagitis: Secondary | ICD-10-CM

## 2020-05-31 DIAGNOSIS — N920 Excessive and frequent menstruation with regular cycle: Secondary | ICD-10-CM

## 2020-05-31 DIAGNOSIS — E038 Other specified hypothyroidism: Secondary | ICD-10-CM | POA: Diagnosis not present

## 2020-05-31 DIAGNOSIS — R7303 Prediabetes: Secondary | ICD-10-CM

## 2020-05-31 DIAGNOSIS — E559 Vitamin D deficiency, unspecified: Secondary | ICD-10-CM

## 2020-05-31 DIAGNOSIS — E782 Mixed hyperlipidemia: Secondary | ICD-10-CM

## 2020-05-31 NOTE — Patient Instructions (Signed)
Fasting CBC, lipid, cmp and EGFr, TSH, vit D this week, as soon as possible  CCUA in office for preop clearance  For right TKA by Dr Theda Sers [facility] , however I will wait on  Your word later today re your final decision as to the Doc who will operate  Please get CXR as soon as possible at the hospital, order is alreay entered  Careful not to fall  Thanks for choosing [facility], we consider it a privelige to serve you.

## 2020-05-31 NOTE — Telephone Encounter (Signed)
She has [employer]  Wants hyaluronic acid injections Will you get approval ? Whatever they will approve.

## 2020-05-31 NOTE — Patient Instructions (Signed)
We will call her

## 2020-06-01 ENCOUNTER — Encounter: Payer: Self-pay | Admitting: Family Medicine

## 2020-06-01 ENCOUNTER — Ambulatory Visit: Payer: 59 | Admitting: Family Medicine

## 2020-06-01 NOTE — Progress Notes (Signed)
Chief Complaint  Patient presents with  . Knee Pain    R/ it has been worse, but today its about a 4 on pain scale.   Encounter Diagnoses  Name Primary?  . Primary localized osteoarthritis of knee right Yes  . 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 in the past she has been followed by Dr. Ace Gins  She has had 3 opinions regarding her knee.  1 physician recommended hyaluronic acid injections indicating she was too young for total knee  A second physician recommended meniscectomy and chondroplasty and a third physician recommended knee replacement  She has had a PRP injection which she said did give her some pain relief  However, she has significant night pain which wakes her up she has been treating that with turmeric and ice with varying results  She has continued swelling at the end of her day and difficulty doing her job  She is limping at work she has trouble going up and down the stairs sequentially, it is hard for her to get out of a chair with a poor get up and go time, she has trouble getting in and out of the car  Her most recent MRI showed near full-thickness loss of articular cartilage on the medial side of the joint along with a medial meniscus tear she also has arthritis in the other compartments as well  She is wanting another opinion about what she should do  We reviewed the MRI and new images that were taken at [facility] including a flexion PA standing view which shows that she has significant narrowing of the medial compartment of the right knee along with some peripheral osteophytes  We discussed this at length including the risks benefits  of knee replacement meniscectomy and the long-term results of hyaluronic acid injection  After this discussion she has opted to try a hyaluronic acid series pending approval  Encounter Diagnoses  Name Primary?  . Primary localized osteoarthritis of knee right Yes  . Derangement of posterior horn of medial meniscus of right knee    (This evaluation was primarily to discuss the risk-benefit ratios of multiple treatment options regarding the patient's right knee.  The time spent involved discussion chart preparation and x-ray review)

## 2020-06-02 ENCOUNTER — Ambulatory Visit: Payer: 59 | Admitting: Family Medicine

## 2020-06-03 NOTE — Telephone Encounter (Signed)
Submitted online Euflexxa, pending BV. Right knee OA.

## 2020-06-03 NOTE — Telephone Encounter (Signed)
Patient's insurance covers Admin and medication at 100%.  Deductible has to be met first though.  Still owes $1974.04 on deductible.  So the cost of all this would go to her until deductible is met.  Exact amount owed not known.  I would estimate $350 per visit.  Only and estimate.  No PA needed.  Ok to buy and bill.   Please let me know how she wants to proceed?  I will need to order the medication.

## 2020-06-04 ENCOUNTER — Telehealth: Payer: Self-pay | Admitting: Orthopedic Surgery

## 2020-06-04 NOTE — Telephone Encounter (Signed)
Patient states she does want to proceed with injection.  Please proceed with ordering for her.  Thanks

## 2020-06-05 NOTE — Telephone Encounter (Signed)
Order 1 box Euflexxa.

## 2020-06-06 ENCOUNTER — Encounter: Payer: Self-pay | Admitting: Family Medicine

## 2020-06-06 NOTE — Assessment & Plan Note (Signed)
Patient educated about the importance of limiting  Carbohydrate intake , the need to commit to daily physical activity for a minimum of 30 minutes , and to commit weight loss. The fact that changes in all these areas will reduce or eliminate all together the development of diabetes is stressed.   Diabetic Labs Latest Ref Rng & Units 12/18/2019 12/11/2019 05/29/2019 11/05/2018 04/22/2018  HbA1c 4.0 - 5.6 % 5.7(A) - 6.1(H) 5.8(H) 6.0(H)  Chol 100 - 199 mg/dL - 204(H) 197 201(H) -  HDL >39 mg/dL - 48 45(L) 48(L) -  Calc LDL 0 - 99 mg/dL - 132(H) 124(H) 128(H) -  Triglycerides 0 - 149 mg/dL - 132 162(H) 133 -  Creatinine 0.57 - 1.00 mg/dL - 1.15(H) 1.04 1.11(H) -   BP/Weight 05/31/2020 05/31/2020 05/19/2020 02/04/2020 12/18/2019 09/22/4828 10/24/5428  Systolic BP 148 403 979 536 922 300 979  Diastolic BP 60 85 75 73 77 64 66  Wt. (Lbs) 187 186.38 175 180 185.4 189 187  BMI 29.29 29.19 27.41 28.19 29.04 29.6 29.29   Foot/eye exam completion dates Latest Ref Rng & Units 12/09/2013  Eye Exam No Retinopathy No Retinopathy  Foot Form Completion - -    Updated lab needed at/ before next visit.

## 2020-06-06 NOTE — Progress Notes (Signed)
Virtual Visit via Telephone Note  I connected with Stacy [name] on 06/06/20 at  3:20 PM EST by telephone and verified that I am speaking with the correct person using two identifiers.  Location: Patient: car in parking lot of Ortho office Provider: office   I discussed the limitations, risks, security and privacy concerns of performing an evaluation and management service by telephone and the availability of in person appointments. I also discussed with the patient that there may be a patient responsible charge related to this service. The patient expressed understanding and agreed to proceed.   History of Present Illness: I need medical clearance for upcoming knee surgery , once I decide on who will do it and what procedure I will have done Paperwork is at the office for completion, any X rays or additional labs needed from previous visit will need to be added. I am not sure what to do, I hurt all the time, limp and have increasing difficulty with ambulation and performing my job due to pain After I see Dr Aline Brochure today, I will decide where to go from here and will let you know Denies recent fever or chills. Denies sinus pressure, nasal congestion, ear pain or sore throat. Denies chest congestion, productive cough or wheezing. Denies chest pains, palpitations and leg swelling Denies abdominal pain, nausea, vomiting,diarrhea or constipation.   Denies dysuria, frequency, hesitancy or incontinence. Denies headaches, seizures, numbness, or tingling. Denies depression, anxiety sleep sometimes disturbed by knee pain Denies skin break down or rash.       Observations/Objective: BP 126/60   Ht 5\' 7"  (1.702 m)   Wt 187 lb (84.8 kg)   LMP 09/20/2016 (Approximate)   BMI 29.29 kg/m  Good communication with no confusion and intact memory. Alert and oriented x 3 No signs of respiratory distress during speech    Assessment and Plan: Pre-operative clearance ROS negative except for  knee pain Need CXR, CCUA and baseline labs fasting, recently had EKG in the office Will await final decision re Dr To contact  Prediabetes Patient educated about the importance of limiting  Carbohydrate intake , the need to commit to daily physical activity for a minimum of 30 minutes , and to commit weight loss. The fact that changes in all these areas will reduce or eliminate all together the development of diabetes is stressed.   Diabetic Labs Latest Ref Rng & Units 12/18/2019 12/11/2019 05/29/2019 11/05/2018 04/22/2018  HbA1c 4.0 - 5.6 % 5.7(A) - 6.1(H) 5.8(H) 6.0(H)  Chol 100 - 199 mg/dL - 204(H) 197 201(H) -  HDL >39 mg/dL - 48 45(L) 48(L) -  Calc LDL 0 - 99 mg/dL - 132(H) 124(H) 128(H) -  Triglycerides 0 - 149 mg/dL - 132 162(H) 133 -  Creatinine 0.57 - 1.00 mg/dL - 1.15(H) 1.04 1.11(H) -   BP/Weight 05/31/2020 05/31/2020 05/19/2020 02/04/2020 12/18/2019 11/25/6657 12/25/5699  Systolic BP 779 390 300 923 300 762 263  Diastolic BP 60 85 75 73 77 64 66  Wt. (Lbs) 187 186.38 175 180 185.4 189 187  BMI 29.29 29.19 27.41 28.19 29.04 29.6 29.29   Foot/eye exam completion dates Latest Ref Rng & Units 12/09/2013  Eye Exam No Retinopathy No Retinopathy  Foot Form Completion - -    Updated lab needed at/ before next visit.   Primary hypothyroidism Updated lab needed at/ before next visit.   Vitamin D deficiency Updated lab needed at/ before next visit.     Follow Up Instructions:  I discussed the assessment and treatment plan with the patient. The patient was provided an opportunity to ask questions and all were answered. The patient agreed with the plan and demonstrated an understanding of the instructions.   The patient was advised to call back or seek an in-person evaluation if the symptoms worsen or if the condition fails to improve as anticipated.  I provided 18  minutes of non-face-to-face time during this encounter.   Stacy Nakayama, MD

## 2020-06-06 NOTE — Assessment & Plan Note (Signed)
Updated lab needed at/ before next visit.   

## 2020-06-06 NOTE — Assessment & Plan Note (Signed)
ROS negative except for knee pain Need CXR, CCUA and baseline labs fasting, recently had EKG in the office Will await final decision re Dr To contact

## 2020-06-07 ENCOUNTER — Ambulatory Visit: Payer: 59 | Admitting: Family Medicine

## 2020-06-07 NOTE — Telephone Encounter (Signed)
Note Patient states she does want to proceed with injection. Abigail Butts has ordered the Euflexxa for her.

## 2020-06-08 NOTE — Telephone Encounter (Signed)
FYI first visit already on schedule.  I will get medication here for her.

## 2020-06-11 ENCOUNTER — Other Ambulatory Visit: Payer: Self-pay

## 2020-06-11 ENCOUNTER — Encounter (HOSPITAL_COMMUNITY): Payer: Self-pay

## 2020-06-11 ENCOUNTER — Ambulatory Visit (HOSPITAL_COMMUNITY)
Admission: EM | Admit: 2020-06-11 | Discharge: 2020-06-11 | Disposition: A | Discharge status: Home / Self Care | Payer: 59

## 2020-06-11 DIAGNOSIS — M25512 Pain in left shoulder: Secondary | ICD-10-CM

## 2020-06-11 MED ORDER — PREDNISONE 10 MG (21) PO TBPK: ORAL_TABLET | Freq: Every day | ORAL | 0 refills | Status: DC

## 2020-06-11 NOTE — ED Provider Notes (Signed)
[place]    CSN: [id number] Arrival date & time: 06/11/20  1216      History   Chief Complaint Chief Complaint  Patient presents with  . Arm Pain  . Shoulder Pain    HPI Stacy [name] is a 60 y.o. female.   HPI   Arm/[place] Pain: Pt c/o pain to left upper arm, left scapular/shoulder region acute onset two nights ago. No history of gout but recently has been on a collagen and seafood diet. NO history of injury or trauma to the area now or in the past. Difficulty adducting/abducting left arm 2/2 pain, reproducible on palpation. Pt reports pain has improved since taking indocin. She denies SOB, dizziness, other extremity weakness, difficulty with speech, memory impairment, n/v, skin color changes, neuro changes.   Past Medical History:  Diagnosis Date  . Abnormal uterine bleeding   . Chronic leukopenia 08/17/2016   WBC = 3.5  . DDD (degenerative disc disease), cervical   . Dyslipidemia   . Elevated serum creatinine 08/17/2016  . GERD (gastroesophageal reflux disease)   . History of esophageal dilatation    2013  . History of ovarian cyst   . Hypothyroidism   . Lactose intolerance   . OSA on CPAP    study done 04-08-2013  . Pre-diabetes   . Right thyroid nodule   . SUI (stress urinary incontinence, female)   . Tonsillar exudate 08/13/2014  . Wears glasses     Patient Active Problem List   Diagnosis Date Noted  . Pre-operative clearance 05/19/2020  . Mixed hyperlipidemia 12/18/2019  . Sacroiliitis ([place]) 05/29/2019  . Elevated serum creatinine 08/26/2018  . Dyspepsia 06/03/2018  . Vitamin D deficiency 04/05/2017  . Primary hypothyroidism 03/11/2015  . 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 Polymenorrhea 08/13/2014  . Pain in joint, ankle and foot 08/13/2014  . Metabolic syndrome X 40/81/4481  . Sleep disorder 02/01/2013  . Prediabetes 01/30/2013  . OA (osteoarthritis) of knee 10/03/2012  . Cervical back pain with evidence of disc  disease 09/24/2012  . Hypothyroidism 09/21/2011  . ABNORMAL ELECTROCARDIOGRAM 03/04/2010  . Anemia 09/15/2009  . LEUKOPENIA, CHRONIC 09/15/2009  . Overweight (BMI 25.0-29.9) 04/24/2009  . Dyslipidemia 05/23/2007  . GERD 05/23/2007    Past Surgical History:  Procedure Laterality Date  . CESAREAN SECTION  1997   Twins-1 twin del.by c/s, 1 del.vaginally  . COLONOSCOPY  04/04/2011   Internal hemorrhoids/Nl colonoscopy-SLIGHTLY TORTUOUS COLON  . DILATATION & CURETTAGE/HYSTEROSCOPY WITH MYOSURE N/A 08/24/2016   Procedure: DILATATION & CURETTAGE/HYSTEROSCOPY WITH MYOSURE Possible Myosure ;  Surgeon: Nunzio Cobbs, MD;  Location: [facility];  Service: Gynecology;  Laterality: N/A;  . DILATION AND CURETTAGE OF UTERUS  1998   Seconday to miscarriage  . ESOPHAGOGASTRODUODENOSCOPY  09/22/2011   Mild gastritis/Hiatal hernia/DYSPHAGIA DUE TO PEPTIC STRICTURE/UNCONTROLLED GERD  . KNEE ARTHROSCOPY Right 2003  . MALONEY DILATION  09/22/2011   Procedure: MALONEY DILATION;  Surgeon: Danie Binder, MD;  Location: [facility];  Service: Endoscopy;  Laterality: N/A;  . SAVORY DILATION  09/22/2011   Procedure: SAVORY DILATION;  Surgeon: Danie Binder, MD;  Location: [facility];  Service: Endoscopy;  Laterality: N/A;    OB History    Gravida  4   Para  3   Term  3   Preterm      AB  1   Living  4     SAB  1   IAB  Ectopic      Multiple  1   Live Births  4            Home Medications    Prior to Admission medications   Medication Sig Start Date End Date Taking? Authorizing Provider  indomethacin (INDOCIN) 50 MG capsule Take 50 mg by mouth 3 (three) times daily with meals.   Yes [provider]  levothyroxine (SYNTHROID) 150 MCG tablet TAKE 1 TABLET(150 MCG) BY MOUTH DAILY BEFORE BREAKFAST 02/23/20  Yes Nida, Marella Chimes, MD  omeprazole (PRILOSEC) 40 MG capsule Take 40 mg by mouth daily. 11/27/19  Yes [provider]  predniSONE (STERAPRED  UNI-PAK 21 TAB) 10 MG (21) TBPK tablet Take by mouth daily. Take 6 tabs by mouth daily  for 2 days, then 5 tabs for 2 days, then 4 tabs for 2 days, then 3 tabs for 2 days, 2 tabs for 2 days, then 1 tab by mouth daily for 2 days 06/11/20  Yes Abaigeal Moomaw M, PA-C  Cholecalciferol (VITAMIN D-3) 125 MCG (5000 UT) TABS Take 1 capsule by mouth daily. 06/12/19   Cassandria Anger, MD  Multiple Vitamin (MULTIVITAMIN) tablet Take 1 tablet by mouth daily.    [provider]  topiramate (TOPAMAX) 25 MG tablet  04/06/20   [provider]    Family History Family History  Problem Relation Age of Onset  . Hypertension Mother   . Angina Mother        MVP. h/o intestinal polyps possibly  . Hyperlipidemia Mother   . Transient ischemic attack Mother   . Hypertension Father   . Hyperlipidemia Father        glucose intolerant  . Diabetes Father   . GER disease Brother   . Diabetes Brother   . Diabetes Maternal Grandfather   . Hyperlipidemia Paternal Grandmother   . Thyroid disease Paternal Grandmother   . Hyperlipidemia Paternal Grandfather   . Thyroid disease Brother        hyperthyroid  . Colon cancer Neg Hx   . Breast cancer Neg Hx   . Ovarian cancer Neg Hx   . Anesthesia problems Neg Hx     Social History Social History   Tobacco Use  . Smoking status: Never Smoker  . Smokeless tobacco: Never Used  Vaping Use  . Vaping Use: Never used  Substance Use Topics  . Alcohol use: No  . Drug use: No     Allergies   Lactose intolerance (gi)   Review of Systems Review of Systems  As stated above in HPI  Physical Exam Triage Vital Signs ED Triage Vitals  Enc Vitals Group     BP 06/11/20 1227 (!) 147/85     Pulse Rate 06/11/20 1227 75     Resp 06/11/20 1227 18     Temp 06/11/20 1227 98.1 F (36.7 C)     Temp Source 06/11/20 1227 Oral     SpO2 06/11/20 1227 97 %     Weight --      Height --      Head Circumference --      Peak Flow --      Pain Score  06/11/20 1225 3     Pain Loc --      Pain Edu? --      Excl. in [place]? --    No data found.  Updated Vital Signs BP (!) 147/85 (BP Location: Right Arm)   Pulse 75   Temp 98.1 F (  36.7 C) (Oral)   Resp 18   LMP 09/20/2016 (Approximate)   SpO2 97%   Physical Exam Vitals and nursing note reviewed.  Constitutional:      General: She is not in acute distress.    Appearance: Normal appearance. She is not ill-appearing, toxic-appearing or diaphoretic.  Cardiovascular:     Rate and Rhythm: Normal rate and regular rhythm.     Heart sounds: Normal heart sounds.  Pulmonary:     Breath sounds: Normal breath sounds.  Musculoskeletal:     Cervical back: Normal range of motion.     Comments: Significantly reduced ROM of the left shoulder. Mild edema. Grip strength 5/5 bilaterally   Lymphadenopathy:     Cervical: No cervical adenopathy.  Skin:    General: Skin is warm and dry.     Capillary Refill: Capillary refill takes less than 2 seconds.     Coloration: Skin is not jaundiced or pale.     Findings: No bruising, erythema, lesion or rash.  Neurological:     General: No focal deficit present.     Mental Status: She is alert. Mental status is at baseline.      UC Treatments / Results  Labs (all labs ordered are listed, but only abnormal results are displayed) Labs Reviewed - No data to display  EKG   Radiology No results found.  Procedures Procedures (including critical care time)  Medications Ordered in UC Medications - No data to display  Initial Impression / Assessment and Plan / UC Course  I have reviewed the triage vital signs and the nursing notes.  Pertinent labs & imaging results that were available during my care of the patient were reviewed by me and considered in my medical decision making (see chart for details).     New. Possibly gout.  Likely she does not have any edema of arms, skin color changes or dusky hue of the arms and pulses are intact.  Without  history of injury or previous injuries to this area and x-ray is not likely to be beneficial.  Will treat with prednisone as she has tolerated this well in the past.  We reviewed how to use along with common potential side effects of precautions.  I have recommended that she follow-up with her PCP next week and attempt to reduce her high purine diet. Final Clinical Impressions(s) / UC Diagnoses   Final diagnoses:  Acute pain of left shoulder   Discharge Instructions   None    ED Prescriptions    Medication Sig Dispense Auth. Provider   predniSONE (STERAPRED UNI-PAK 21 TAB) 10 MG (21) TBPK tablet Take by mouth daily. Take 6 tabs by mouth daily  for 2 days, then 5 tabs for 2 days, then 4 tabs for 2 days, then 3 tabs for 2 days, 2 tabs for 2 days, then 1 tab by mouth daily for 2 days 42 tablet Rael Yo, Holli Humbles, [place]     PDMP not reviewed this encounter.   Hughie Closs, [place] 06/11/20 1425

## 2020-06-11 NOTE — ED Triage Notes (Signed)
Pt c/o pain to left upper arm, left scapular/shoulder region acute onset two nights ago. Difficulty adducting/abducting left arm 2/2 pain, reproducible on palpation. Pt reports pain has improved since taking indocin.  Denies CP, SOB, dizziness, other extremity weakness, difficulty with speech, memory impairment, n/v. +2 radial pulse.

## 2020-06-14 ENCOUNTER — Other Ambulatory Visit: Payer: Self-pay

## 2020-06-14 ENCOUNTER — Ambulatory Visit (INDEPENDENT_AMBULATORY_CARE_PROVIDER_SITE_OTHER): Payer: 59 | Admitting: Orthopedic Surgery

## 2020-06-14 ENCOUNTER — Ambulatory Visit: Payer: 59

## 2020-06-14 ENCOUNTER — Encounter: Payer: Self-pay | Admitting: Family Medicine

## 2020-06-14 DIAGNOSIS — M25512 Pain in left shoulder: Secondary | ICD-10-CM

## 2020-06-14 DIAGNOSIS — M1711 Unilateral primary osteoarthritis, right knee: Secondary | ICD-10-CM

## 2020-06-14 DIAGNOSIS — M7532 Calcific tendinitis of left shoulder: Secondary | ICD-10-CM | POA: Diagnosis not present

## 2020-06-14 DIAGNOSIS — E790 Hyperuricemia without signs of inflammatory arthritis and tophaceous disease: Secondary | ICD-10-CM

## 2020-06-14 NOTE — Telephone Encounter (Signed)
She made an appt

## 2020-06-14 NOTE — Patient Instructions (Addendum)
Calcific Tendinitis  Calcific tendinitis occurs when crystals of calcium are deposited in a tendon. Tendons are tough, cord-like tissues that connect muscle to bone. Tendons are an important part of joints. They make joints move, and they absorb some of the stress that a joint receives during use. When calcium is deposited in the tendon, the tendon becomes stiff and painful and may become swollen. Calcific tendinitis often occurs in a tendon in the rotator cuff. The rotator cuff is a group of muscles and tendons in the shoulder joint. What are the causes? The cause of calcific tendinitis is not known. It may be associated with:  Overusing a tendon, such as from repetitive motion.  Too much stress on the tendon.  Age-related wear and tear.  Repetitive, mild injuries. What increases the risk? This condition is more likely to develop in:  People who do activities that involve repetitive motions.  Older people. What are the signs or symptoms? Symptoms of this condition include:  Pain. This condition may or may not be painful. If there is pain, it may occur when moving the joint.  Tenderness when pressure is applied to the tendon.  A snapping or popping sound when the joint moves.  Decreased motion of the joint.  Difficulty sleeping due to pain in the joint. How is this diagnosed? This condition is diagnosed with a physical exam. You may also have tests, such as:  X-rays.  MRI.  CT scan. How is this treated? This condition generally gets better on its own. Treatment may also include:  Resting, icing, applying pressure (compression), and raising (elevating) the area above the level of your heart. This is known as RICE therapy.  Applying heat to the affected area.  Medicines to help reduce inflammation or pain.  Physical therapy to strengthen and stretch the tendon.  Following a specific exercise program to keep the joint working properly. In severe cases, treatment may  include:  Injecting steroids or pain-relieving medicines into or around the joint.  Manipulating the joint after you are given medicine to numb the area (local anesthetic).  Inflating the joint with sterile fluid to increase the flexibility of the tendons.  Having shock wave therapy, which involves focusing sound waves on the joint. If other treatments do not work, surgery may be needed to clean out the calcium deposits and repair the tendon. Most people do not need surgery. Follow these instructions at home: Managing pain, stiffness, and swelling  If directed, put ice on the affected area. To do this: ? Put ice in a plastic bag. ? Place a towel between your skin and the bag. ? Leave the ice on for 20 minutes, 2-3 times a day.  If directed, apply heat to the affected area before you exercise or as often as told by your health care provider. Use the heat source that your health care provider recommends, such as a moist heat pack or a heating pad. ? Place a towel between your skin and the heat source. ? Leave the heat on for 20-30 minutes. ? Remove the heat if your skin turns bright red. This is especially important if you are unable to feel pain, heat, or cold. You may have a greater risk of getting burned.  Move the fingers or toes of the affected limb often. This can help to reduce stiffness and swelling.  Raise (elevate) the injured area above the level of your heart while you are sitting or lying down.      Medicines  Take over-the-counter and prescription medicines only as told by your health care provider.  Ask your health care provider if the medicine prescribed to you requires you to avoid driving or using heavy machinery. General instructions  Do not use any products that contain nicotine or tobacco, such as cigarettes, e-cigarettes, and chewing tobacco. These can delay healing. If you need help quitting, ask your health care provider.  Follow recommendations from your  health care provider about activity and exercise. Ask your health care provider what activities are safe for you.  Avoid using the affected area while you are experiencing symptoms of tendinitis.  Wear an elastic bandage or compression wrap as told by your health care provider.  Keep all follow-up visits as told by your health care provider. This is important. Contact a health care provider if you:  Have pain or numbness that gets worse.  Develop new weakness.  Notice increased joint stiffness or a sensation of looseness in the joint.  Notice increasing redness, swelling, or warmth around the joint area.  Have a fever and your symptoms suddenly get worse. Summary  Calcific tendinitis occurs when crystals of calcium are deposited in a tendon.  Calcific tendinitis occurs frequently in a tendon in the rotator cuff, which is a group of muscles and tendons in the shoulder joint.  This condition may or may not be painful.  This condition generally gets better on its own.  Treatment may include rest, ice, medicines, physical therapy, and in rare cases surgery. This information is not intended to replace advice given to you by your health care provider. Make sure you discuss any questions you have with your health care provider. Document Revised: 02/20/2019 Document Reviewed: 02/20/2019 Elsevier Patient Education  [place].

## 2020-06-14 NOTE — Progress Notes (Signed)
Chief Complaint  Patient presents with  . Shoulder Pain    L/ can't lift my arm /went to Urgent Care on 06/11/20 they gave me some Prednisone but didn't do xrays. Haven't fell or hurt it in anyway that I can think of.  . Knee Pain    Here for my injections.right knee    Problem #1  Patient here for Euflexxa injection #1 right knee.  No contraindication to getting that injection in the face of her acute shoulder pain  Problem #2 Patient is dentist did completely well on Wednesday no problems woke up Thursday with acute pain and loss of motion left shoulder.  She did start some steroids which did help her knee pain but she still cannot abduct her shoulder.  There is some concern that she may have had gout in the past or did have gout in the past and she has been on a new diet  However to me it seems like either a calcific tendinitis or acute bursitis  X-ray should help confirm  Prior internal images show that there is some calcification near the supraspinatus tendon otherwise normal glenohumeral joint   Physical Exam Constitutional:      General: She is not in acute distress.    Appearance: She is well-developed.     Comments: Well developed, well nourished Normal grooming and hygiene     Cardiovascular:     Comments: No peripheral edema Musculoskeletal:     Comments: Left shoulder active motion is painful she abducts about 40 degrees  She has passive external rotation in adduction 45 degrees and I can passively abduct the shoulder to about 75 degrees but is painful throughout the arc of motion there is tenderness on the lateral side of the arm on the deltoid    Skin:    General: Skin is warm and dry.  Neurological:     Mental Status: She is alert and oriented to person, place, and time.     Sensory: No sensory deficit.     Coordination: Coordination normal.     Gait: Gait normal.     Deep Tendon Reflexes: Reflexes are normal and symmetric.  Psychiatric:        Mood and  Affect: Mood normal.        Behavior: Behavior normal.        Thought Content: Thought content normal.        Judgment: Judgment normal.     Comments: Affect normal     Procedure note for injection of hyaluronic acid   Diagnosis osteoarthritis of the knee  Verbal consent was obtained to inject the knee with HYALURONIC ACID . Timeout was completed to confirm the injection site as the RIGHT    Knee  Ethyl chloride spray was used for anesthesia Alcohol was used to prep the skin. The infrapatellar lateral portal was used as an injection site and 1 vial of hyaluronic acid  was injected into the knee  Specific Co. Preparation: Euflexxa  Procedure note the subacromial injection shoulder left   Verbal consent was obtained to inject the  Left   Shoulder   FU 1 week to inject 2/3 euflexa right knee and reck bursits left shoulder  Timeout was completed to confirm the injection site is a subacromial space of the  left  shoulder  Medication used Depo-Medrol 40 mg and lidocaine 1% 3 cc  Anesthesia was provided by ethyl chloride  The injection was performed in the left  posterior subacromial space. After  pinning the skin with alcohol and anesthetized the skin with ethyl chloride the subacromial space was injected using a 20-gauge needle. There were no complications  Sterile dressing was applied.          No complications were noted

## 2020-06-15 ENCOUNTER — Encounter: Payer: Self-pay | Admitting: Internal Medicine

## 2020-06-16 ENCOUNTER — Encounter: Payer: Self-pay | Admitting: Family Medicine

## 2020-06-16 LAB — CMP14+EGFR
ALT: 18 IU/L (ref 0–32)
AST: 16 IU/L (ref 0–40)
Albumin/Globulin Ratio: 1.5 (ref 1.2–2.2)
Albumin: 4.4 g/dL (ref 3.8–4.9)
Alkaline Phosphatase: 67 IU/L (ref 44–121)
BUN/Creatinine Ratio: 13 (ref 12–28)
BUN: 13 mg/dL (ref 8–27)
Bilirubin Total: 0.3 mg/dL (ref 0.0–1.2)
CO2: 24 mmol/L (ref 20–29)
Calcium: 9.4 mg/dL (ref 8.7–10.3)
Chloride: 103 mmol/L (ref 96–106)
Creatinine, Ser: 0.97 mg/dL (ref 0.57–1.00)
GFR calc Af Amer: 73 mL/min/{1.73_m2} (ref >59)
GFR calc non Af Amer: 64 mL/min/{1.73_m2} (ref >59)
Globulin, Total: 2.9 g/dL (ref 1.5–4.5)
Glucose: 87 mg/dL (ref 65–99)
Potassium: 4.1 mmol/L (ref 3.5–5.2)
Sodium: 141 mmol/L (ref 134–144)
Total Protein: 7.3 g/dL (ref 6.0–8.5)

## 2020-06-16 LAB — CBC WITH DIFFERENTIAL
Basophils Absolute: 0 10*3/uL (ref 0.0–0.2)
Basos: 0 %
EOS (ABSOLUTE): 0 10*3/uL (ref 0.0–0.4)
Eos: 0 %
Hematocrit: 34.5 % (ref 34.0–46.6)
Hemoglobin: 11.7 g/dL (ref 11.1–15.9)
Immature Grans (Abs): 0 10*3/uL (ref 0.0–0.1)
Immature Granulocytes: 0 %
Lymphocytes Absolute: 1.4 10*3/uL (ref 0.7–3.1)
Lymphs: 17 %
MCH: 29.5 pg (ref 26.6–33.0)
MCHC: 33.9 g/dL (ref 31.5–35.7)
MCV: 87 fL (ref 79–97)
Monocytes Absolute: 0.5 10*3/uL (ref 0.1–0.9)
Monocytes: 6 %
Neutrophils Absolute: 6.5 10*3/uL (ref 1.4–7.0)
Neutrophils: 77 %
RBC: 3.96 x10E6/uL (ref 3.77–5.28)
RDW: 12 % (ref 11.7–15.4)
WBC: 8.5 10*3/uL (ref 3.4–10.8)

## 2020-06-16 LAB — LIPID PANEL
Chol/HDL Ratio: 2.9 ratio (ref 0.0–4.4)
Cholesterol, Total: 171 mg/dL (ref 100–199)
HDL: 58 mg/dL (ref >39)
LDL Chol Calc (NIH): 98 mg/dL (ref 0–99)
Triglycerides: 81 mg/dL (ref 0–149)
VLDL Cholesterol Cal: 15 mg/dL (ref 5–40)

## 2020-06-16 LAB — VITAMIN D 25 HYDROXY (VIT D DEFICIENCY, FRACTURES): Vit D, 25-Hydroxy: 61.7 ng/mL (ref 30.0–100.0)

## 2020-06-16 LAB — URIC ACID: Uric Acid: 3.2 mg/dL (ref 3.0–7.2)

## 2020-06-16 LAB — T4, FREE: Free T4: 1.37 ng/dL (ref 0.82–1.77)

## 2020-06-16 LAB — TSH: TSH: 0.873 u[IU]/mL (ref 0.450–4.500)

## 2020-06-21 ENCOUNTER — Telehealth: Payer: 59 | Admitting: Family Medicine

## 2020-06-23 ENCOUNTER — Telehealth (INDEPENDENT_AMBULATORY_CARE_PROVIDER_SITE_OTHER): Payer: 59 | Admitting: Family Medicine

## 2020-06-23 ENCOUNTER — Other Ambulatory Visit: Payer: Self-pay

## 2020-06-23 ENCOUNTER — Encounter: Payer: Self-pay | Admitting: Family Medicine

## 2020-06-23 DIAGNOSIS — K219 Gastro-esophageal reflux disease without esophagitis: Secondary | ICD-10-CM

## 2020-06-23 DIAGNOSIS — E663 Overweight: Secondary | ICD-10-CM

## 2020-06-23 DIAGNOSIS — E038 Other specified hypothyroidism: Secondary | ICD-10-CM

## 2020-06-23 DIAGNOSIS — M1710 Unilateral primary osteoarthritis, unspecified knee: Secondary | ICD-10-CM | POA: Diagnosis not present

## 2020-06-23 NOTE — Patient Instructions (Addendum)
F/u in 7 months, call if you need me sooner  I recommend that you discontinue ALL supplements in the form that you take them  Tumeric as a spice is a good thing to continue  Continue close f/u with Ortho re management of  Your knee, and be careful not to fall Think about what you will eat, plan ahead. Choose " clean, green, fresh or frozen" over canned, processed or packaged foods which are more sugary, salty and fatty. 70 to 75% of food eaten should be vegetables and fruit. Three meals at set times with snacks allowed between meals, but they must be fruit or vegetables. Aim to eat over a 12 hour period , example 7 am to 7 pm, and STOP after  your last meal of the day. Drink water,generally about 64 ounces per day, no other drink is as healthy. Fruit juice is best enjoyed in a healthy way, by EATING the fruit. Thanks for choosing [facility], we consider it a privelige to serve you.

## 2020-06-23 NOTE — Telephone Encounter (Signed)
Please advise 

## 2020-06-24 ENCOUNTER — Ambulatory Visit: Payer: 59 | Admitting: "Endocrinology

## 2020-06-24 ENCOUNTER — Ambulatory Visit: Payer: 59 | Admitting: Orthopedic Surgery

## 2020-06-25 ENCOUNTER — Ambulatory Visit: Payer: 59 | Admitting: "Endocrinology

## 2020-06-27 ENCOUNTER — Encounter: Payer: Self-pay | Admitting: Family Medicine

## 2020-06-27 NOTE — Assessment & Plan Note (Signed)
Controlled on current medication and managed by Endo

## 2020-06-27 NOTE — Assessment & Plan Note (Signed)
  Patient re-educated about  the importance of commitment to a  minimum of 150 minutes of exercise per week as able.  The importance of healthy food choices with portion control discussed, as well as eating regularly and within a 12 hour window most days. The need to choose "clean , green" food 50 to 75% of the time is discussed, as well as to make water the primary drink and set a goal of 64 ounces water daily.    Weight /BMI 05/31/2020 05/31/2020 05/19/2020  WEIGHT 187 lb 186 lb 6 oz 175 lb  HEIGHT 5\' 7"  5\' 7"  5\' 7"   BMI 29.29 kg/m2 29.19 kg/m2 27.41 kg/m2

## 2020-06-27 NOTE — Progress Notes (Signed)
Virtual Visit via Telephone Note  I connected with Stacy [name] on 06/27/20 at  4:00 PM EST by telephone and verified that I am speaking with the correct person using two identifiers.  Location: Patient: work Provider: remote office   I discussed the limitations, risks, security and privacy concerns of performing an evaluation and management service by telephone and the availability of in person appointments. I also discussed with the patient that there may be a patient responsible charge related to this service. The patient expressed understanding and agreed to proceed.   History of Present Illness: F/u medication and lab review Labs are excellent Pt reports that she has independently discontinued a long list of supplements in the hope that this will positively influence her joint problems and overall health. List has been forwarded , and my recommendation is that all supplements be discontinued and those she can take as pure spice like tumeric which has been shown to have anti inflammatory benefit , be continued  She has had injection in knee , states was very painful and without oral prednisone she would not have made it. Appears to be leaning toward replacement although current recommendation by Dr Aline Brochure is trial of injections, ah she has upcoming appt with him Had an acute flare of shoulder pain, x ray shows calcific tendinosis, being managed by Ortho Has appt with Endo in near future to re assess prediabetic/ diabetic status and f/u thyroid disease   Observations/Objective: LMP 09/20/2016 (Approximate)  Good communication with no confusion and intact memory. Alert and oriented x 3 No signs of respiratory distress during speech    Assessment and Plan: Hypothyroidism Controlled on current medication and managed by Endo  Overweight (BMI 25.0-29.9)  Patient re-educated about  the importance of commitment to a  minimum of 150 minutes of exercise per week as able.  The  importance of healthy food choices with portion control discussed, as well as eating regularly and within a 12 hour window most days. The need to choose "clean , green" food 50 to 75% of the time is discussed, as well as to make water the primary drink and set a goal of 64 ounces water daily.    Weight /BMI 05/31/2020 05/31/2020 05/19/2020  WEIGHT 187 lb 186 lb 6 oz 175 lb  HEIGHT 5\' 7"  5\' 7"  5\' 7"   BMI 29.29 kg/m2 29.19 kg/m2 27.41 kg/m2      OA (osteoarthritis) of knee Conservative management currently directed by Dr Aline Brochure, reportedly need for replacement in the future Fall risk reduction and safety briefly addessed  GERD Controlled, no change in medication     Follow Up Instructions:    I discussed the assessment and treatment plan with the patient. The patient was provided an opportunity to ask questions and all were answered. The patient agreed with the plan and demonstrated an understanding of the instructions.   The patient was advised to call back or seek an in-person evaluation if the symptoms worsen or if the condition fails to improve as anticipated.  I provided 10  minutes of non-face-to-face time during this encounter.   [name], MD

## 2020-06-27 NOTE — Assessment & Plan Note (Signed)
Controlled, no change in medication  

## 2020-06-27 NOTE — Assessment & Plan Note (Signed)
Conservative management currently directed by Dr Aline Brochure, reportedly need for replacement in the future Fall risk reduction and safety briefly addessed

## 2020-06-28 ENCOUNTER — Ambulatory Visit (INDEPENDENT_AMBULATORY_CARE_PROVIDER_SITE_OTHER): Payer: 59 | Admitting: Orthopedic Surgery

## 2020-06-28 ENCOUNTER — Other Ambulatory Visit: Payer: Self-pay

## 2020-06-28 DIAGNOSIS — M1711 Unilateral primary osteoarthritis, right knee: Secondary | ICD-10-CM | POA: Diagnosis not present

## 2020-06-28 DIAGNOSIS — M1710 Unilateral primary osteoarthritis, unspecified knee: Secondary | ICD-10-CM

## 2020-06-28 DIAGNOSIS — M7532 Calcific tendinitis of left shoulder: Secondary | ICD-10-CM

## 2020-06-28 NOTE — Progress Notes (Signed)
Chief Complaint  Patient presents with  . Knee Pain    R/ here to get my next injection. Some pain.   Right knee   Normal temp to touch   Euflexxa injection #2 right knee no contraindications at this point injected Euflexxa No. 2 right knee sterile technique with ethyl chloride as a anesthesia agent  Follow-up in a week  Note left shoulder pain decreased range of motion improved

## 2020-06-29 ENCOUNTER — Telehealth: Payer: Self-pay | Admitting: "Endocrinology

## 2020-06-29 NOTE — Telephone Encounter (Signed)
Received medical records request, sent to scan and [facility]

## 2020-07-02 ENCOUNTER — Other Ambulatory Visit: Payer: Self-pay

## 2020-07-02 ENCOUNTER — Encounter: Payer: Self-pay | Admitting: "Endocrinology

## 2020-07-02 ENCOUNTER — Ambulatory Visit (INDEPENDENT_AMBULATORY_CARE_PROVIDER_SITE_OTHER): Payer: 59 | Admitting: "Endocrinology

## 2020-07-02 VITALS — BP 123/78 | HR 64 | Ht 67.0 in

## 2020-07-02 DIAGNOSIS — E782 Mixed hyperlipidemia: Secondary | ICD-10-CM | POA: Diagnosis not present

## 2020-07-02 DIAGNOSIS — E039 Hypothyroidism, unspecified: Secondary | ICD-10-CM

## 2020-07-02 DIAGNOSIS — R7303 Prediabetes: Secondary | ICD-10-CM

## 2020-07-02 DIAGNOSIS — E042 Nontoxic multinodular goiter: Secondary | ICD-10-CM | POA: Diagnosis not present

## 2020-07-02 MED ORDER — LEVOTHYROXINE SODIUM 150 MCG PO TABS: ORAL_TABLET | ORAL | 3 refills | Status: DC

## 2020-07-02 NOTE — Progress Notes (Signed)
07/02/2020      Endocrinology follow-up note   Subjective:    Patient ID: Stacy [name], female    DOB: [birth year],  PCP Fayrene Helper, MD   Past Medical History:  Diagnosis Date  . Abnormal uterine bleeding   . Chronic leukopenia 08/17/2016   WBC = 3.5  . DDD (degenerative disc disease), cervical   . Dyslipidemia   . Elevated serum creatinine 08/17/2016  . GERD (gastroesophageal reflux disease)   . History of esophageal dilatation    2013  . History of ovarian cyst   . Hypothyroidism   . Lactose intolerance   . OSA on CPAP    study done 04-08-2013  . Pre-diabetes   . Right thyroid nodule   . SUI (stress urinary incontinence, female)   . Tonsillar exudate 08/13/2014  . Wears glasses    Past Surgical History:  Procedure Laterality Date  . CESAREAN SECTION  1997   Twins-1 twin del.by c/s, 1 del.vaginally  . COLONOSCOPY  04/04/2011   Internal hemorrhoids/Nl colonoscopy-SLIGHTLY TORTUOUS COLON  . DILATATION & CURETTAGE/HYSTEROSCOPY WITH MYOSURE N/A 08/24/2016   Procedure: DILATATION & CURETTAGE/HYSTEROSCOPY WITH MYOSURE Possible Myosure ;  Surgeon: Nunzio Cobbs, MD;  Location: [facility];  Service: Gynecology;  Laterality: N/A;  . DILATION AND CURETTAGE OF UTERUS  1998   Seconday to miscarriage  . ESOPHAGOGASTRODUODENOSCOPY  09/22/2011   Mild gastritis/Hiatal hernia/DYSPHAGIA DUE TO PEPTIC STRICTURE/UNCONTROLLED GERD  . KNEE ARTHROSCOPY Right 2003  . MALONEY DILATION  09/22/2011   Procedure: MALONEY DILATION;  Surgeon: Danie Binder, MD;  Location: [facility];  Service: Endoscopy;  Laterality: N/A;  . SAVORY DILATION  09/22/2011   Procedure: SAVORY DILATION;  Surgeon: Danie Binder, MD;  Location: [facility];  Service: Endoscopy;  Laterality: N/A;   Social History   Socioeconomic History  . Marital status: Married    Spouse name: Not on file  . Number of children: Not on file  . Years of education: Not on file  . Highest education level: Not  on file  Occupational History  . Occupation: Full time: [employer]  Tobacco Use  . Smoking status: Never Smoker  . Smokeless tobacco: Never Used  Vaping Use  . Vaping Use: Never used  Substance and Sexual Activity  . Alcohol use: No  . Drug use: No  . Sexual activity: Yes    Partners: Male    Birth control/protection: Other-see comments    Comment: husband with vasectomy  Other Topics Concern  . Not on file  Social History Narrative   Married to [name], neurology   Regular exercise: No   Social Determinants of Health   Financial Resource Strain: Not on file  Food Insecurity: Not on file  Transportation Needs: Not on file  Physical Activity: Not on file  Stress: Not on file  Social Connections: Not on file   Outpatient Encounter Medications as of 07/02/2020  Medication Sig  . Cholecalciferol (VITAMIN D-3) 125 MCG (5000 UT) TABS Take 1 capsule by mouth daily.  . indomethacin (INDOCIN) 50 MG capsule Take 50 mg by mouth 3 (three) times daily with meals.  Marland Kitchen levothyroxine (SYNTHROID) 150 MCG tablet TAKE 1 TABLET(150 MCG) BY MOUTH DAILY BEFORE BREAKFAST  . Multiple Vitamin (MULTIVITAMIN) tablet Take 1 tablet by mouth daily.  Marland Kitchen omeprazole (PRILOSEC) 40 MG capsule Take 40 mg by mouth daily.  . [DISCONTINUED] levothyroxine (SYNTHROID) 150 MCG tablet TAKE 1 TABLET(150 MCG) BY MOUTH DAILY BEFORE BREAKFAST  . [  DISCONTINUED] topiramate (TOPAMAX) 25 MG tablet    No facility-administered encounter medications on file as of 07/02/2020.   ALLERGIES: Allergies  Allergen Reactions  . Lactose Intolerance (Gi)     Vomiting, diarrhea, bloating   VACCINATION STATUS: Immunization History  Administered Date(s) Administered  . Influenza Split 02/10/2014  . Influenza,inj,Quad PF,6+ Mos 01/30/2013, 01/12/2015, 01/30/2017, 04/22/2018, 01/29/2019  . Moderna Sars-Covid-2 Vaccination 07/09/2019, 07/29/2019, 03/20/2020  . Tdap 05/09/2012  . Zoster Recombinat (Shingrix) 05/21/2018    HPI  60  yr old female with hypothyroidism from Hashimoto's thyroiditis. She is currently on levothyroxine 150 mcg daily before breakfast.  She reports better consistency taking her medication.  She has no new complaints today.  She reports steady weight.  She has engaged in regular exercise and diet program.  She decided not to take the Crestor, her lipid profile is improving.  -  She denies tremors, heat intolerance.  Her previsit thyroid function tests are consistent with appropriate replacement.  -  she denies any family hx of thyroid dysfunction. She has a stable  1.0 cm right thyroid lobe nodule which has remained stable for 7 years.   -She is a mother of  4 grown children, with hx of HEELP syndrome during the first pregnancy. -She did not tolerate metformin which was prescribed due to prediabetes, most recent A1c was 5.7% improving from 6.1%.  She has dyslipidemia on no treatment.  -She is on ongoing vitamin D supplement.       Review of Systems Limited as above.  Objective:    BP 123/78   Pulse 64   Ht 5\' 7"  (1.702 m)   LMP 09/20/2016 (Approximate)   BMI 29.19 kg/m   Wt Readings from Last 3 Encounters:  05/31/20 186 lb 6 oz (84.5 kg)  05/31/20 187 lb (84.8 kg)  05/19/20 175 lb (79.4 kg)      Results for orders placed or performed in visit on 06/14/20  Uric acid  Result Value Ref Range   Uric Acid 3.2 3.0 - 7.2 mg/dL   Chemistry (most recent): Lab Results  Component Value Date   NA 141 06/15/2020   K 4.1 06/15/2020   CL 103 06/15/2020   CO2 24 06/15/2020   BUN 13 06/15/2020   CREATININE 0.97 06/15/2020   Diabetic Labs (most recent): Lab Results  Component Value Date   HGBA1C 5.7 (A) 12/18/2019   HGBA1C 6.1 (H) 05/29/2019   HGBA1C 5.8 (H) 11/05/2018   Lipid Panel     Component Value Date/Time   CHOL 171 06/15/2020 0903   TRIG 81 06/15/2020 0903   HDL 58 06/15/2020 0903   CHOLHDL 2.9 06/15/2020 0903   CHOLHDL 4.4 05/29/2019 1412   VLDL 19 03/09/2015 1509    LDLCALC 98 06/15/2020 0903   LDLCALC 124 (H) 05/29/2019 1412     Assessment & Plan:   1) hypothyroidism from Hashimoto's thyroiditis:   - Her thyroid function test are consistent with appropriate replacement she is advised to continue   levothyroxine 150 mcg p.o. daily before breakfast.   - We discussed about the correct intake of her thyroid hormone, on empty stomach at fasting, with water, separated by at least 30 minutes from breakfast and other medications,  and separated by more than 4 hours from calcium, iron, multivitamins, acid reflux medications (PPIs). -Patient is made aware of the fact that thyroid hormone replacement is needed for life, dose to be adjusted by periodic monitoring of thyroid function tests.    2) multinodular  goiter  -Her previsit thyroid ultrasound shows stable 1 cm nodule on the right lobe, stable finding for 7 years.  She will not need thyroid imaging this year.     3) prediabetes:  -Her most recent A1c is 5.7% improving from 6.1%.  She did not tolerate Metformin.  - she acknowledges that there is a room for improvement in her food and drink choices. - Suggestion is made for her to avoid simple carbohydrates  from her diet including Cakes, Sweet Desserts, Ice Cream, Soda (diet and regular), Sweet Tea, Candies, Chips, Cookies, Store Bought Juices, Alcohol in Excess of  1-2 drinks a day, Artificial Sweeteners,  Coffee Creamer, and "Sugar-free" Products, Lemonade. This will help patient to have more stable blood glucose profile and potentially avoid unintended weight gain.  -She will be considered for A1c point-of-care next visit.    4) mixed hyperlipidemia -She decided not to take the Crestor.  Her LDL has improved to 98 from 132 mainly due to dietary changes.  She is encouraged to continue on the diet and exercise program she has engaged in .  - I advised her to be consistent with her omega-3 fatty acids 1000 mg total daily.   5) vitamin D deficiency:  She is currently on vitamin D replacement at 5000 units daily.  She is advised to maintain close follow-up with her PMD Dr. [name].    - Time spent on this patient care encounter:  30 minutes of which 50% was spent in  counseling and the rest reviewing  her current and  previous labs / studies and medications  doses and developing a plan for long term care, and documenting this care. Carlynn Herald  participated in the discussions, expressed understanding, and voiced agreement with the above plans.  All questions were answered to her satisfaction. she is encouraged to contact clinic should she have any questions or concerns prior to her return visit.   Follow up plan: Return in about 1 year (around 07/02/2021) for F/U with Pre-visit Labs, A1c -NV.  Glade Lloyd, MD Phone: [phone]  Fax: [phone]  -  This note was partially dictated with voice recognition software. Similar sounding words can be transcribed inadequately or may not  be corrected upon review.  07/02/2020, 1:19 PM

## 2020-07-12 ENCOUNTER — Other Ambulatory Visit: Payer: Self-pay

## 2020-07-12 ENCOUNTER — Encounter: Payer: Self-pay | Admitting: Orthopedic Surgery

## 2020-07-12 ENCOUNTER — Ambulatory Visit (INDEPENDENT_AMBULATORY_CARE_PROVIDER_SITE_OTHER): Payer: 59 | Admitting: Orthopedic Surgery

## 2020-07-12 VITALS — BP 136/76 | HR 58 | Ht 67.0 in | Wt 185.2 lb

## 2020-07-12 DIAGNOSIS — M1711 Unilateral primary osteoarthritis, right knee: Secondary | ICD-10-CM

## 2020-07-12 DIAGNOSIS — M1710 Unilateral primary osteoarthritis, unspecified knee: Secondary | ICD-10-CM

## 2020-07-12 NOTE — Progress Notes (Signed)
Chief Complaint  Patient presents with  . Knee Pain    R/ its hurting and bothering me right much/it is giving out on me more/not stable.    euflexa # 3  Symptoms noted   Knee looks ok for injection   Right knee  Procedure note for injection of hyaluronic acid   Diagnosis osteoarthritis of the knee  Verbal consent was obtained to inject the knee with HYALURONIC ACID . Timeout was completed to confirm the injection site as the right    Knee  Ethyl chloride spray was used for anesthesia Alcohol was used to prep the skin. The infrapatellar lateral portal was used as an injection site and 1 vial of hyaluronic acid  was injected into the knee  Specific Co. Preparation: euflexa   No complications were noted  Encounter Diagnosis  Name Primary?  . Primary localized osteoarthritis of knee right Yes   Probably should consider TKA   Fu prn

## 2020-07-26 ENCOUNTER — Ambulatory Visit (INDEPENDENT_AMBULATORY_CARE_PROVIDER_SITE_OTHER): Payer: 59 | Admitting: Orthopedic Surgery

## 2020-07-26 ENCOUNTER — Encounter: Payer: Self-pay | Admitting: Orthopedic Surgery

## 2020-07-26 ENCOUNTER — Other Ambulatory Visit: Payer: Self-pay

## 2020-07-26 ENCOUNTER — Telehealth: Payer: Self-pay | Admitting: Radiology

## 2020-07-26 VITALS — BP 154/74 | HR 64 | Ht 67.0 in | Wt 182.0 lb

## 2020-07-26 DIAGNOSIS — M1711 Unilateral primary osteoarthritis, right knee: Secondary | ICD-10-CM | POA: Diagnosis not present

## 2020-07-26 DIAGNOSIS — M1710 Unilateral primary osteoarthritis, unspecified knee: Secondary | ICD-10-CM

## 2020-07-26 NOTE — Telephone Encounter (Signed)
Put in the referral will send for her

## 2020-07-26 NOTE — Telephone Encounter (Signed)
-----   Message from Carole Civil, MD sent at 07/26/2020  4:15 PM EDT ----- Refer to dr Alvan Dame

## 2020-07-26 NOTE — Progress Notes (Signed)
Chief Complaint  Patient presents with  . Knee Pain    Right/ injections helped arthritis some but knee is locking up (3 times today)    Encounter Diagnosis  Name Primary?  . 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  She is somewhat limited in her ability to take that because of her reflux/GERD  After some discussion and contemplation she is ready to have knee replacement surgery and would like it done in [facility]  Referral to Dr. Ihor Gully

## 2020-12-26 DIAGNOSIS — M24661 Ankylosis, right knee: Secondary | ICD-10-CM | POA: Insufficient documentation

## 2021-02-01 ENCOUNTER — Ambulatory Visit: Payer: 59 | Admitting: Family Medicine

## 2021-03-02 ENCOUNTER — Ambulatory Visit (INDEPENDENT_AMBULATORY_CARE_PROVIDER_SITE_OTHER): Payer: 59 | Admitting: Family Medicine

## 2021-03-02 ENCOUNTER — Other Ambulatory Visit: Payer: Self-pay

## 2021-03-02 ENCOUNTER — Encounter: Payer: Self-pay | Admitting: Family Medicine

## 2021-03-02 DIAGNOSIS — Z1231 Encounter for screening mammogram for malignant neoplasm of breast: Secondary | ICD-10-CM

## 2021-03-02 DIAGNOSIS — Z1211 Encounter for screening for malignant neoplasm of colon: Secondary | ICD-10-CM

## 2021-03-02 DIAGNOSIS — M1710 Unilateral primary osteoarthritis, unspecified knee: Secondary | ICD-10-CM | POA: Diagnosis not present

## 2021-03-02 DIAGNOSIS — Z01419 Encounter for gynecological examination (general) (routine) without abnormal findings: Secondary | ICD-10-CM

## 2021-03-02 DIAGNOSIS — E038 Other specified hypothyroidism: Secondary | ICD-10-CM

## 2021-03-02 DIAGNOSIS — R7303 Prediabetes: Secondary | ICD-10-CM

## 2021-03-02 NOTE — Progress Notes (Signed)
Virtual Visit via Telephone Note  I connected with Stacy [name] on 03/02/21 at  4:40 PM EST by telephone and verified that I am speaking with the correct person using two identifiers.  Location: Patient: home Provider: office   I discussed the limitations, risks, security and privacy concerns of performing an evaluation and management service by telephone and the availability of in person appointments. I also discussed with the patient that there may be a patient responsible charge related to this service. The patient expressed understanding and agreed to proceed.   History of Present Illness: C/o ongoing right knee pain and instability following recent right knee surgery, she has been in therapy thought he ortho who operated and is about to be discharged , but feels she needs additional therapy , has been told insurance will no cover so calling to see if I can facilitate this. Sill under Ortho care  so I advise her to have Ortho direct this Chronic and preventive care is behind and catch up is started   Observations/Objective: LMP 09/20/2016 (Approximate)  Good communication with no confusion and intact memory. Alert and oriented x 3 No signs of respiratory distress during speech   Assessment and Plan: Hypothyroidism Updated lab needed at/ before next visit.   OA (osteoarthritis) of knee Chronic right knee pain post op, continue management by Ortho   Follow Up Instructions:    I discussed the assessment and treatment plan with the patient. The patient was provided an opportunity to ask questions and all were answered. The patient agreed with the plan and demonstrated an understanding of the instructions.   The patient was advised to call back or seek an in-person evaluation if the symptoms worsen or if the condition fails to improve as anticipated.  I provided 13 minutes of non-face-to-face time during this encounter.   [name], MD

## 2021-03-02 NOTE — Patient Instructions (Addendum)
F/U in 4 months, call if you need me before, shingrix   Please sched mammogram morning appt Dec 3 or after  Need colonscopy past due we will refer  Please get flu vaccine, and shingrix #2   Please get covid booster, past due  You will be referred to gyne who you have most recently seen  HBA1C and TSH in t in the next 2 weeks  Thanks for choosing [facility], we consider it a privelige to serve you.

## 2021-03-06 ENCOUNTER — Encounter: Payer: Self-pay | Admitting: Family Medicine

## 2021-03-06 NOTE — Assessment & Plan Note (Signed)
Chronic right knee pain post op, continue management by Ortho

## 2021-03-06 NOTE — Assessment & Plan Note (Signed)
Updated lab needed at/ before next visit.   

## 2021-03-08 ENCOUNTER — Encounter: Payer: Self-pay | Admitting: Internal Medicine

## 2021-03-11 ENCOUNTER — Ambulatory Visit (INDEPENDENT_AMBULATORY_CARE_PROVIDER_SITE_OTHER): Payer: 59 | Admitting: *Deleted

## 2021-03-11 ENCOUNTER — Other Ambulatory Visit: Payer: Self-pay

## 2021-03-11 DIAGNOSIS — Z23 Encounter for immunization: Secondary | ICD-10-CM

## 2021-03-11 NOTE — Addendum Note (Signed)
Addended by: Glee Arvin D on: 03/11/2021 01:09 PM   Modules accepted: Orders

## 2021-03-28 ENCOUNTER — Inpatient Hospital Stay (HOSPITAL_COMMUNITY): Admission: RE | Admit: 2021-03-28 | Payer: 59 | Source: Ambulatory Visit

## 2021-03-30 ENCOUNTER — Other Ambulatory Visit: Payer: Self-pay

## 2021-03-30 ENCOUNTER — Ambulatory Visit (HOSPITAL_COMMUNITY)
Admission: RE | Admit: 2021-03-30 | Discharge: 2021-03-30 | Disposition: A | Discharge status: Home / Self Care | Payer: 59 | Source: Ambulatory Visit | Attending: Family Medicine | Admitting: Family Medicine

## 2021-03-30 DIAGNOSIS — Z1231 Encounter for screening mammogram for malignant neoplasm of breast: Secondary | ICD-10-CM | POA: Insufficient documentation

## 2021-04-21 ENCOUNTER — Ambulatory Visit: Payer: 59

## 2021-05-23 ENCOUNTER — Ambulatory Visit: Payer: 59 | Admitting: Orthopedic Surgery

## 2021-05-23 ENCOUNTER — Encounter: Payer: Self-pay | Admitting: Orthopedic Surgery

## 2021-05-23 ENCOUNTER — Other Ambulatory Visit: Payer: Self-pay

## 2021-05-23 ENCOUNTER — Ambulatory Visit (INDEPENDENT_AMBULATORY_CARE_PROVIDER_SITE_OTHER): Payer: 59

## 2021-05-23 VITALS — BP 141/96 | HR 82 | Ht 67.0 in | Wt 190.0 lb

## 2021-05-23 DIAGNOSIS — M25562 Pain in left knee: Secondary | ICD-10-CM

## 2021-05-23 DIAGNOSIS — M1712 Unilateral primary osteoarthritis, left knee: Secondary | ICD-10-CM | POA: Diagnosis not present

## 2021-05-23 MED ORDER — CELECOXIB 100 MG PO CAPS: 100.0000 mg | ORAL_CAPSULE | Freq: Two times a day (BID) | ORAL | 1 refills | Status: DC

## 2021-05-23 NOTE — Progress Notes (Signed)
Chief Complaint  Patient presents with   Knee Pain    Left knee painful for about a week    61 yo female with acute left knee pain s/p rt tka   Medial knee pain rad to shin but NOT proximal   Tylenol and indocin seems to help with the pain   Physical Exam Constitutional:      General: She is not in acute distress.    Appearance: She is well-developed.     Comments: Well developed, well nourished Normal grooming and hygiene     Cardiovascular:     Comments: No peripheral edema Musculoskeletal:     Comments: Left knee   Medial joint line tender the most   The condyle is only a little tender  The knee may have an effusion  The ROM is good     Skin:    General: Skin is warm and dry.  Neurological:     Mental Status: She is alert and oriented to person, place, and time.     Sensory: No sensory deficit.     Coordination: Coordination normal.     Gait: Gait normal.     Deep Tendon Reflexes: Reflexes are normal and symmetric.  Psychiatric:        Mood and Affect: Mood normal.        Behavior: Behavior normal.        Thought Content: Thought content normal.        Judgment: Judgment normal.     Comments: Affect normal    Xrays:   Effusion, mod ptf dx ;mild tibio-femoral dx   Procedure note left knee injection   verbal consent was obtained to inject left knee joint  Timeout was completed to confirm the site of injection  The medications used were depomedrol 40 mg and 1% lidocaine  Anesthesia was provided by ethyl chloride and the skin was prepped with alcohol.  After cleaning the skin with alcohol a 20-gauge needle was used to inject the left knee joint. There were no complications. A sterile bandage was applied.   Meds ordered this encounter  Medications   celecoxib (CELEBREX) 100 MG capsule    Sig: Take 1 capsule (100 mg total) by mouth 2 (two) times daily.    Dispense:  60 capsule    Refill:  1

## 2021-05-30 ENCOUNTER — Ambulatory Visit: Payer: 59 | Admitting: Orthopedic Surgery

## 2021-06-22 ENCOUNTER — Telehealth: Payer: Self-pay | Admitting: "Endocrinology

## 2021-06-22 DIAGNOSIS — E782 Mixed hyperlipidemia: Secondary | ICD-10-CM

## 2021-06-22 DIAGNOSIS — E039 Hypothyroidism, unspecified: Secondary | ICD-10-CM

## 2021-06-22 NOTE — Telephone Encounter (Signed)
Can you update her labs 

## 2021-06-22 NOTE — Telephone Encounter (Signed)
Lab orders updated and sent to [facility]. 

## 2021-06-29 ENCOUNTER — Ambulatory Visit: Payer: 59 | Admitting: Family Medicine

## 2021-06-30 LAB — VITAMIN D 25 HYDROXY (VIT D DEFICIENCY, FRACTURES): Vit D, 25-Hydroxy: 60.6 ng/mL (ref 30.0–100.0)

## 2021-06-30 LAB — LIPID PANEL
Chol/HDL Ratio: 3.8 ratio (ref 0.0–4.4)
Cholesterol, Total: 178 mg/dL (ref 100–199)
HDL: 47 mg/dL (ref >39)
LDL Chol Calc (NIH): 110 mg/dL — ABNORMAL HIGH (ref 0–99)
Triglycerides: 116 mg/dL (ref 0–149)
VLDL Cholesterol Cal: 21 mg/dL (ref 5–40)

## 2021-06-30 LAB — TSH
TSH: 0.551 u[IU]/mL (ref 0.450–4.500)
TSH: 0.565 u[IU]/mL (ref 0.450–4.500)

## 2021-06-30 LAB — HEMOGLOBIN A1C
Est. average glucose Bld gHb Est-mCnc: 131 mg/dL
Hgb A1c MFr Bld: 6.2 % — ABNORMAL HIGH (ref 4.8–5.6)

## 2021-06-30 LAB — T4, FREE: Free T4: 1.72 ng/dL (ref 0.82–1.77)

## 2021-07-01 ENCOUNTER — Ambulatory Visit (INDEPENDENT_AMBULATORY_CARE_PROVIDER_SITE_OTHER): Payer: 59 | Admitting: Family Medicine

## 2021-07-01 ENCOUNTER — Ambulatory Visit (INDEPENDENT_AMBULATORY_CARE_PROVIDER_SITE_OTHER): Payer: 59 | Admitting: "Endocrinology

## 2021-07-01 ENCOUNTER — Encounter: Payer: Self-pay | Admitting: Family Medicine

## 2021-07-01 ENCOUNTER — Encounter: Payer: Self-pay | Admitting: "Endocrinology

## 2021-07-01 ENCOUNTER — Other Ambulatory Visit: Payer: Self-pay

## 2021-07-01 VITALS — BP 116/74 | HR 68 | Ht 67.0 in | Wt 191.8 lb

## 2021-07-01 VITALS — BP 130/80 | HR 72 | Resp 16 | Ht 67.0 in | Wt 192.1 lb

## 2021-07-01 DIAGNOSIS — E8881 Metabolic syndrome: Secondary | ICD-10-CM

## 2021-07-01 DIAGNOSIS — M79662 Pain in left lower leg: Secondary | ICD-10-CM | POA: Insufficient documentation

## 2021-07-01 DIAGNOSIS — E782 Mixed hyperlipidemia: Secondary | ICD-10-CM

## 2021-07-01 DIAGNOSIS — G4719 Other hypersomnia: Secondary | ICD-10-CM | POA: Insufficient documentation

## 2021-07-01 DIAGNOSIS — E038 Other specified hypothyroidism: Secondary | ICD-10-CM

## 2021-07-01 DIAGNOSIS — R7303 Prediabetes: Secondary | ICD-10-CM

## 2021-07-01 DIAGNOSIS — R0683 Snoring: Secondary | ICD-10-CM | POA: Insufficient documentation

## 2021-07-01 DIAGNOSIS — E663 Overweight: Secondary | ICD-10-CM

## 2021-07-01 DIAGNOSIS — E042 Nontoxic multinodular goiter: Secondary | ICD-10-CM

## 2021-07-01 DIAGNOSIS — E039 Hypothyroidism, unspecified: Secondary | ICD-10-CM

## 2021-07-01 DIAGNOSIS — G4733 Obstructive sleep apnea (adult) (pediatric): Secondary | ICD-10-CM

## 2021-07-01 NOTE — Assessment & Plan Note (Signed)
6 week h/o left calf pain, no dyspnea o r shortness of breath, localized in medical left knee ?

## 2021-07-01 NOTE — Progress Notes (Signed)
07/01/2021      Endocrinology follow-up note   Subjective:    Patient ID: Stacy [name], female    DOB: 12-03-1960,  PCP Fayrene Helper, MD   Past Medical History:  Diagnosis Date   Abnormal uterine bleeding    Chronic leukopenia 08/17/2016   WBC = 3.5   DDD (degenerative disc disease), cervical    Dyslipidemia    Elevated serum creatinine 08/17/2016   GERD (gastroesophageal reflux disease)    History of esophageal dilatation    2013   History of ovarian cyst    Hypothyroidism    Lactose intolerance    OSA on CPAP    study done 04-08-2013   Pre-diabetes    Right thyroid nodule    SUI (stress urinary incontinence, female)    Tonsillar exudate 08/13/2014   Wears glasses    Past Surgical History:  Procedure Laterality Date   CESAREAN SECTION  1997   Twins-1 twin del.by c/s, 1 del.vaginally   COLONOSCOPY  04/04/2011   Internal hemorrhoids/Nl colonoscopy-SLIGHTLY TORTUOUS COLON   DILATATION & CURETTAGE/HYSTEROSCOPY WITH MYOSURE N/A 08/24/2016   Procedure: DILATATION & CURETTAGE/HYSTEROSCOPY WITH MYOSURE Possible Myosure ;  Surgeon: Nunzio Cobbs, MD;  Location: [facility];  Service: Gynecology;  Laterality: N/A;   DILATION AND CURETTAGE OF UTERUS  1998   Seconday to miscarriage   ESOPHAGOGASTRODUODENOSCOPY  09/22/2011   Mild gastritis/Hiatal hernia/DYSPHAGIA DUE TO PEPTIC STRICTURE/UNCONTROLLED GERD   KNEE ARTHROSCOPY Right 2003   MALONEY DILATION  09/22/2011   Procedure: MALONEY DILATION;  Surgeon: Danie Binder, MD;  Location: [facility];  Service: Endoscopy;  Laterality: N/A;   REPLACEMENT TOTAL KNEE Right 2022   SAVORY DILATION  09/22/2011   Procedure: SAVORY DILATION;  Surgeon: Danie Binder, MD;  Location: [facility];  Service: Endoscopy;  Laterality: N/A;   Social History   Socioeconomic History   Marital status: Married    Spouse name: Not on file   Number of children: Not on file   Years of education: Not on file   Highest  education level: Not on file  Occupational History   Occupation: Full time: [employer]  Tobacco Use   Smoking status: Never   Smokeless tobacco: Never  Vaping Use   Vaping Use: Never used  Substance and Sexual Activity   Alcohol use: No   Drug use: No   Sexual activity: Yes    Partners: Male    Birth control/protection: Other-see comments    Comment: husband with vasectomy  Other Topics Concern   Not on file  Social History Narrative   Married to [name], neurology   Regular exercise: No   Social Determinants of Health   Financial Resource Strain: Not on file  Food Insecurity: Not on file  Transportation Needs: Not on file  Physical Activity: Not on file  Stress: Not on file  Social Connections: Not on file   Outpatient Encounter Medications as of 07/01/2021  Medication Sig   celecoxib (CELEBREX) 100 MG capsule Take 1 capsule (100 mg total) by mouth 2 (two) times daily.   Cholecalciferol (VITAMIN D-3) 125 MCG (5000 UT) TABS Take 1 capsule by mouth daily.   indomethacin (INDOCIN) 50 MG capsule Take 50 mg by mouth 3 (three) times daily with meals. (Patient not taking: Reported on 07/01/2021)   levothyroxine (SYNTHROID) 150 MCG tablet TAKE 1 TABLET(150 MCG) BY MOUTH DAILY BEFORE BREAKFAST   Multiple Vitamin (MULTIVITAMIN) tablet Take 1 tablet by mouth daily.  omeprazole (PRILOSEC) 40 MG capsule Take 40 mg by mouth daily.   No facility-administered encounter medications on file as of 07/01/2021.   ALLERGIES: Allergies  Allergen Reactions   Lactose Intolerance (Gi)     Vomiting, diarrhea, bloating   VACCINATION STATUS: Immunization History  Administered Date(s) Administered   Influenza Split 02/10/2014   Influenza,inj,Quad PF,6+ Mos 01/30/2013, 01/12/2015, 01/30/2017, 04/22/2018, 01/29/2019, 03/11/2021   Moderna Sars-Covid-2 Vaccination 07/09/2019, 07/29/2019, 03/20/2020   Tdap 05/09/2012   Zoster Recombinat (Shingrix) 05/21/2018, 03/11/2021    HPI  61 yr old  female with hypothyroidism from Hashimoto's thyroiditis. She is currently on levothyroxine 150 mcg p.o. daily before breakfast.    She reports better consistency taking her medication.  She has no new complaints today.  She reports progressive weight gain, prediabetes with A1c of 6.2% today.  She also has hyperlipidemia, on Crestor.    -  She denies tremors, heat intolerance.  Her previsit thyroid function tests are consistent with appropriate replacement.   she denies any family hx of thyroid dysfunction. She has a stable  1.0 cm right thyroid lobe nodule which has remained stable for 7 years.   -She is a mother of  4 grown children, with hx of HEELP syndrome during the first pregnancy. -She did not tolerate metformin which was prescribed due to prediabetes. -She is on ongoing vitamin D supplement.       Review of Systems Limited as above.  Objective:    BP 116/74    Pulse 68    Ht '5\' 7"'$  (1.702 m)    Wt 191 lb 12.8 oz (87 kg)    LMP 09/20/2016 (Approximate)    BMI 30.04 kg/m   Wt Readings from Last 3 Encounters:  07/01/21 191 lb 12.8 oz (87 kg)  05/23/21 190 lb (86.2 kg)  07/26/20 182 lb (82.6 kg)      Results for orders placed or performed in visit on 06/22/21  Lipid Panel  Result Value Ref Range   Cholesterol, Total 178 100 - 199 mg/dL   Triglycerides 116 0 - 149 mg/dL   HDL 47 >39 mg/dL   VLDL Cholesterol Cal 21 5 - 40 mg/dL   LDL Chol Calc (NIH) 110 (H) 0 - 99 mg/dL   Chol/HDL Ratio 3.8 0.0 - 4.4 ratio  Vitamin D, 25-hydroxy  Result Value Ref Range   Vit D, 25-Hydroxy 60.6 30.0 - 100.0 ng/mL  T4, Free  Result Value Ref Range   Free T4 1.72 0.82 - 1.77 ng/dL  TSH  Result Value Ref Range   TSH 0.565 0.450 - 4.500 uIU/mL   Chemistry (most recent): Lab Results  Component Value Date   NA 141 06/15/2020   K 4.1 06/15/2020   CL 103 06/15/2020   CO2 24 06/15/2020   BUN 13 06/15/2020   CREATININE 0.97 06/15/2020   Diabetic Labs (most recent): Lab Results   Component Value Date   HGBA1C 6.2 (H) 06/29/2021   HGBA1C 5.7 (A) 12/18/2019   HGBA1C 6.1 (H) 05/29/2019   Lipid Panel     Component Value Date/Time   CHOL 178 06/29/2021 1309   TRIG 116 06/29/2021 1309   HDL 47 06/29/2021 1309   CHOLHDL 3.8 06/29/2021 1309   CHOLHDL 4.4 05/29/2019 1412   VLDL 19 03/09/2015 1509   LDLCALC 110 (H) 06/29/2021 1309   LDLCALC 124 (H) 05/29/2019 1412     Assessment & Plan:   1) hypothyroidism from Hashimoto's thyroiditis:   - Her thyroid function test are  consistent with appropriate replacement.  She is advised to continue levothyroxine 150 mcg p.o. daily before breakfast.     - We discussed about the correct intake of her thyroid hormone, on empty stomach at fasting, with water, separated by at least 30 minutes from breakfast and other medications,  and separated by more than 4 hours from calcium, iron, multivitamins, acid reflux medications (PPIs). -Patient is made aware of the fact that thyroid hormone replacement is needed for life, dose to be adjusted by periodic monitoring of thyroid function tests.   2) multinodular goiter  -Her previsit thyroid ultrasound shows stable 1 cm nodule on the right lobe, stable finding for 7 years.  She will not need any further thyroid imaging.      3) prediabetes:  -Her point-of-care A1c today is 6.2%, progressively increasing.  She does not tolerate metformin.  Given the fact that she has metabolic syndrome with prediabetes, hyperlipidemia, BMI of 30, she is a good candidate for lifestyle medicine   - she acknowledges that there is a room for improvement in her food and drink choices. - Suggestion is made for her to avoid simple carbohydrates  from her diet including Cakes, Sweet Desserts, Ice Cream, Soda (diet and regular), Sweet Tea, Candies, Chips, Cookies, Store Bought Juices, Alcohol , Artificial Sweeteners,  Coffee Creamer, and "Sugar-free" Products, Lemonade. This will help patient to have more stable  blood glucose profile and potentially avoid unintended weight gain.  The following Lifestyle Medicine recommendations according to [facility]) were discussed and and offered to patient and she  agrees to start the journey:  A. Whole Foods, Plant-Based Nutrition comprising of fruits and vegetables, plant-based proteins, whole-grain carbohydrates was discussed in detail with the patient.   A list for source of those nutrients were also provided to the patient.  Patient will use only water or unsweetened tea for hydration. B.  The need to stay away from risky substances including alcohol, smoking; obtaining 7 to 9 hours of restorative sleep, at least 150 minutes of moderate intensity exercise weekly, the importance of healthy social connections,  and stress management techniques were discussed. C.  A full color page of  Calorie density of various food groups per pound showing examples of each food groups was provided to the patient.    4) mixed hyperlipidemia -She has been hesitant to take her Crestor.  Her LDL is 110, has been as high as 132.  The above detailed WF PB diet will help with dyslipidemia as well.    5) vitamin D deficiency: She is currently on vitamin D replacement at 5000 units daily.  She is advised to maintain close follow-up with her PMD Dr. [name].   I spent 35 minutes in the care of the patient today including review of labs from Thyroid Function, CMP, and other relevant labs ; imaging/biopsy records (current and previous including abstractions from other facilities); face-to-face time discussing  her lab results and symptoms, medications doses, her options of short and long term treatment based on the latest standards of care / guidelines;   and documenting the encounter.  Carlynn Herald  participated in the discussions, expressed understanding, and voiced agreement with the above plans.  All questions were answered to her  satisfaction. she is encouraged to contact clinic should she have any questions or concerns prior to her return visit.   Follow up plan: Return in about 6 months (around 01/01/2022) for F/U with Pre-visit Labs,  A1c -NV.  Glade Lloyd, MD Phone: [phone]  Fax: [phone]  -  This note was partially dictated with voice recognition software. Similar sounding words can be transcribed inadequately or may not  be corrected upon review.  07/01/2021, 1:24 PM

## 2021-07-01 NOTE — Patient Instructions (Addendum)
F/U in 6 months, call if you need me before ? ?Please schedule your pap ? ?  Venous doppler next week ? ? ?Thanks for choosing [facility], we consider it a privelige to serve you. ? ?Please give appt with pulmonary at checkout , Dr Elsworth Soho for sleep apnea ? ? ?Please covid  booster ? ?Thanks for choosing [facility], we consider it a privelige to serve you. ? ?

## 2021-07-03 ENCOUNTER — Encounter: Payer: Self-pay | Admitting: Family Medicine

## 2021-07-03 NOTE — Assessment & Plan Note (Signed)
Hyperlipidemia:Low fat diet discussed and encouraged. ? ? ?Lipid Panel  ?Lab Results  ?Component Value Date  ? CHOL 178 06/29/2021  ? HDL 47 06/29/2021  ? LDLCALC 110 (H) 06/29/2021  ? TRIG 116 06/29/2021  ? CHOLHDL 3.8 06/29/2021  ? ? ? ?Dietary change ?

## 2021-07-03 NOTE — Assessment & Plan Note (Signed)
The increased risk of cardiovascular disease associated with this diagnosis, and the need to consistently work on lifestyle to change this is discussed. Following  a  heart healthy diet ,commitment to 30 minutes of exercise at least 5 days per week, as well as control of blood sugar and cholesterol , and achieving a healthy weight are all the areas to be addressed .  

## 2021-07-03 NOTE — Progress Notes (Signed)
? ?ITHA [name]     MRN: [id number]      DOB: November 20, 1960 ? ? ?HPI ?Stacy [name] is here for follow up and re-evaluation of chronic medical conditions, medication management and review of any available recent lab and radiology data.  ?Preventive health is updated, specifically  Cancer screening and Immunization.   ?The PT denies any adverse reactions to current medications since the last visit.  ?Just met with Endo and is enrolled in lifestyle university to address lipid and diabetes/ prediabetes concerns and obesity. Will return in 6 months ?C/o left knee and calf pain and swelling concerned about possibility of blood clot. Denies cough, hemoptysis or dyspnea ?Needs pap and mammogram  ? ?ROS ?Denies recent fever or chills. ?Denies sinus pressure, nasal congestion, ear pain or sore throat. ?Denies chest congestion, productive cough or wheezing. ?Denies chest pains, palpitations and leg swelling ?Denies abdominal pain, nausea, vomiting,diarrhea or constipation.   ?Denies dysuria, frequency, hesitancy or incontinence. ? ?Denies headaches, seizures, numbness, or tingling. ?Denies depression, anxiety or insomnia. ?Denies skin break down or rash. ? ? ?PE ? ?BP 130/80   Pulse 72   Resp 16   Ht $R'5\' 7"'UA$  (1.702 m)   Wt 192 lb 1.9 oz (87.1 kg)   LMP 09/20/2016 (Approximate)   SpO2 96%   BMI 30.09 kg/m?  ? ?Patient alert and oriented and in no cardiopulmonary distress. ? ?HEENT: No facial asymmetry, EOMI,     Neck supple . ? ?Chest: Clear to auscultation bilaterally. ? ?CVS: S1, S2 no murmurs, no S3.Regular rate. ? ?ABD: Soft non tender.  ? ?Ext: No edema ? ?MS: Adequate ROM spine, shoulders, hips and knees.Tender over lateral right knee also left calf tenderness ? ?Skin: Intact, no ulcerations or rash noted. ? ?Psych: Good eye contact, normal affect. Memory intact not anxious or depressed appearing. ? ?CNS: CN 2-12 intact, power,  normal throughout.no focal deficits noted. ? ? ?Assessment & Plan ? ?Pain of left  calf ?6 week h/o left calf pain, no dyspnea o r shortness of breath, localized in medical left knee ? ?Snoring ?Refer for sleep tudy and titration of cPAP ? ?Overweight (BMI 25.0-29.9) ? ?Patient re-educated about  the importance of commitment to a  minimum of 150 minutes of exercise per week as able. ? ?The importance of healthy food choices with portion control discussed, as well as eating regularly and within a 12 hour window most days. ?The need to choose "clean , green" food 50 to 75% of the time is discussed, as well as to make water the primary drink and set a goal of 64 ounces water daily. ? ?  ?Weight /BMI 07/01/2021 07/01/2021 05/23/2021  ?WEIGHT 192 lb 1.9 oz 191 lb 12.8 oz 190 lb  ?HEIGHT $Remove'5\' 7"'lnZrlSf$  $RemoveB'5\' 7"'tKkqObVD$  $RemoveBe'5\' 7"'ETLwDqFGG$   ?BMI 30.09 kg/m2 30.04 kg/m2 29.76 kg/m2  ? ? ? ? ?Hypothyroidism ?Managed by endo ? ?Mixed hyperlipidemia ?Hyperlipidemia:Low fat diet discussed and encouraged. ? ? ?Lipid Panel  ?Lab Results  ?Component Value Date  ? CHOL 178 06/29/2021  ? HDL 47 06/29/2021  ? LDLCALC 110 (H) 06/29/2021  ? TRIG 116 06/29/2021  ? CHOLHDL 3.8 06/29/2021  ? ? ? ?Dietary change ? ?Prediabetes ?Patient educated about the importance of limiting  Carbohydrate intake , the need to commit to daily physical activity for a minimum of 30 minutes , and to commit weight loss. ?The fact that changes in all these areas will reduce or eliminate all together the development of diabetes is  stressed.  ?Deteriorated, lifestyle/ dietary change ? ?Diabetic Labs Latest Ref Rng & Units 06/29/2021 06/15/2020 12/18/2019 12/11/2019 05/29/2019  ?HbA1c 4.8 - 5.6 % 6.2(H) - 5.7(A) - 6.1(H)  ?Chol 100 - 199 mg/dL 178 171 - 204(H) 197  ?HDL >39 mg/dL 47 58 - 48 45(L)  ?Calc LDL 0 - 99 mg/dL 110(H) 98 - 132(H) 124(H)  ?Triglycerides 0 - 149 mg/dL 116 81 - 132 162(H)  ?Creatinine 0.57 - 1.00 mg/dL - 0.97 - 1.15(H) 1.04  ? ?BP/Weight 07/01/2021 07/01/2021 05/23/2021 07/26/2020 07/12/2020 07/02/2020 06/11/2020  ?Systolic BP 834 758 307 460 136 123 147  ?Diastolic BP 80  74 96 74 76 78 85  ?Wt. (Lbs) 192.12 191.8 190 182 185.25 - -  ?BMI 30.09 30.04 29.76 28.51 29.01 29.19 -  ? ?Foot/eye exam completion dates Latest Ref Rng & Units 12/09/2013  ?Eye Exam No Retinopathy No Retinopathy  ?Foot Form Completion - -  ? ? ? ? ?Metabolic syndrome X ?The increased risk of cardiovascular disease associated with this diagnosis, and the need to consistently work on lifestyle to change this is discussed. ?Following  a  heart healthy diet ,commitment to 30 minutes of exercise at least 5 days per week, as well as control of blood sugar and cholesterol , and achieving a healthy weight are all the areas to be addressed . ? ? ?

## 2021-07-03 NOTE — Assessment & Plan Note (Signed)
Managed by endo

## 2021-07-03 NOTE — Assessment & Plan Note (Signed)
Refer for sleep tudy and titration of cPAP ?

## 2021-07-03 NOTE — Assessment & Plan Note (Signed)
Patient educated about the importance of limiting  Carbohydrate intake , the need to commit to daily physical activity for a minimum of 30 minutes , and to commit weight loss. ?The fact that changes in all these areas will reduce or eliminate all together the development of diabetes is stressed.  ?Deteriorated, lifestyle/ dietary change ? ?Diabetic Labs Latest Ref Rng & Units 06/29/2021 06/15/2020 12/18/2019 12/11/2019 05/29/2019  ?HbA1c 4.8 - 5.6 % 6.2(H) - 5.7(A) - 6.1(H)  ?Chol 100 - 199 mg/dL 178 171 - 204(H) 197  ?HDL >39 mg/dL 47 58 - 48 45(L)  ?Calc LDL 0 - 99 mg/dL 110(H) 98 - 132(H) 124(H)  ?Triglycerides 0 - 149 mg/dL 116 81 - 132 162(H)  ?Creatinine 0.57 - 1.00 mg/dL - 0.97 - 1.15(H) 1.04  ? ?BP/Weight 07/01/2021 07/01/2021 05/23/2021 07/26/2020 07/12/2020 07/02/2020 06/11/2020  ?Systolic BP 696 295 284 132 136 123 147  ?Diastolic BP 80 74 96 74 76 78 85  ?Wt. (Lbs) 192.12 191.8 190 182 185.25 - -  ?BMI 30.09 30.04 29.76 28.51 29.01 29.19 -  ? ?Foot/eye exam completion dates Latest Ref Rng & Units 12/09/2013  ?Eye Exam No Retinopathy No Retinopathy  ?Foot Form Completion - -  ? ? ? ?

## 2021-07-03 NOTE — Assessment & Plan Note (Signed)
?  Patient re-educated about  the importance of commitment to a  minimum of 150 minutes of exercise per week as able. ? ?The importance of healthy food choices with portion control discussed, as well as eating regularly and within a 12 hour window most days. ?The need to choose "clean , green" food 50 to 75% of the time is discussed, as well as to make water the primary drink and set a goal of 64 ounces water daily. ? ?  ?Weight /BMI 07/01/2021 07/01/2021 05/23/2021  ?WEIGHT 192 lb 1.9 oz 191 lb 12.8 oz 190 lb  ?HEIGHT '5\' 7"'$  '5\' 7"'$  '5\' 7"'$   ?BMI 30.09 kg/m2 30.04 kg/m2 29.76 kg/m2  ? ? ? ?

## 2021-07-08 ENCOUNTER — Other Ambulatory Visit: Payer: Self-pay | Admitting: "Endocrinology

## 2021-08-26 ENCOUNTER — Other Ambulatory Visit: Payer: Self-pay | Admitting: Orthopedic Surgery

## 2021-08-26 DIAGNOSIS — M1712 Unilateral primary osteoarthritis, left knee: Secondary | ICD-10-CM

## 2021-08-26 DIAGNOSIS — M25562 Pain in left knee: Secondary | ICD-10-CM

## 2021-09-16 ENCOUNTER — Institutional Professional Consult (permissible substitution): Payer: 59 | Admitting: Pulmonary Disease

## 2021-11-04 ENCOUNTER — Institutional Professional Consult (permissible substitution): Payer: 59 | Admitting: Pulmonary Disease

## 2021-12-30 ENCOUNTER — Encounter: Payer: Self-pay | Admitting: Pulmonary Disease

## 2021-12-30 ENCOUNTER — Ambulatory Visit (INDEPENDENT_AMBULATORY_CARE_PROVIDER_SITE_OTHER): Payer: 59 | Admitting: Pulmonary Disease

## 2021-12-30 VITALS — BP 128/64 | HR 60 | Temp 98.4°F | Ht 67.0 in | Wt 184.6 lb

## 2021-12-30 DIAGNOSIS — G4733 Obstructive sleep apnea (adult) (pediatric): Secondary | ICD-10-CM | POA: Diagnosis not present

## 2021-12-30 NOTE — Patient Instructions (Signed)
Will arrange for home sleep study Will call to arrange for follow up after sleep study reviewed  

## 2021-12-30 NOTE — Progress Notes (Signed)
[facility]  Chief Complaint  Patient presents with   Consult    Sleep consult ref by Dr. Moshe Cipro. Has a CPAP machine that is old and uses intermittently     Past Surgical History:  She  has a past surgical history that includes Knee arthroscopy (Right, 2003); Colonoscopy (04/04/2011); Esophagogastroduodenoscopy (09/22/2011); Savory dilation (09/22/2011); maloney dilation (09/22/2011); Cesarean section (1997); Dilation and curettage of uterus (1998); Dilatation & curettage/hysteroscopy with myosure (N/A, 08/24/2016); and Replacement total knee (Right, 2022).  Past Medical History:  Leukopenia, DDD, HLD, GERD, Hypothyroidism, Pre DM  Constitutional:  BP 128/64 (BP Location: Right Arm, Patient Position: Sitting)   Pulse 60   Temp 98.4 F (36.9 C) (Temporal)   Ht '5\' 7"'$  (1.702 m)   Wt 184 lb 9.6 oz (83.7 kg)   LMP 09/20/2016 (Approximate)   SpO2 99% Comment: ra  BMI 28.91 kg/m   Brief Summary:  Stacy [name] is a 61 y.o. female with obstructive sleep apnea.  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      Subjective:   She had a sleep study in 2014.  Showed moderate sleep apnea.  She was started on Bipap.  Did okay with this, but use faded out.  She does recall any particular difficulty with using PAP therapy before.    She was recently seen by her GI doctor for reflux and was advised that sleep apnea can make reflux worse.    She snores at night and wakes up with a cough.  She is tired all the time.  She falls asleep easily when watching TV.  She goes to sleep at 1145 pm.  She falls asleep in seconds.  She wakes up one or two times to use the bathroom.  She gets out of bed at 530 am.  She feels tired in the morning.  She has been getting headaches during the day.  She does not use anything to help her fall sleep or stay awake.  She denies sleep walking, sleep talking, bruxism, or nightmares.  There is no history of restless  legs.  She denies sleep hallucinations, sleep paralysis, or cataplexy.  The Epworth score is 14 out of 24.   Physical Exam:   Appearance - well kempt   ENMT - no sinus tenderness, no oral exudate, no LAN, Mallampati 4 airway, no stridor  Respiratory - equal breath sounds bilaterally, no wheezing or rales  CV - s1s2 regular rate and rhythm, no murmurs  Ext - no clubbing, no edema  Skin - no rashes  Psych - normal mood and affect   Sleep Tests:  PSG 03/17/13 >> AHI 19, SpO2 low 84%  Social History:  She  reports that she has never smoked. She has never used smokeless tobacco. She reports that she does not drink alcohol and does not use drugs.  Family History:  Her family history includes Angina in her mother; Diabetes in her brother, father, and maternal grandfather; GER disease in her brother; Hyperlipidemia in her father, mother, paternal grandfather, and paternal grandmother; Hypertension in her father and mother; Thyroid disease in her brother and paternal grandmother; Transient ischemic attack in her mother.    Discussion:  She has snoring, sleep disruption, apnea, and daytime sleepiness.  She has prior history of sleep apnea.  I am concerned she could still have significant obstructive sleep apnea.  Assessment/Plan:   Snoring with excessive daytime sleepiness. - will need to arrange for a home  sleep study  Obesity. - discussed how weight can impact sleep and risk for sleep disordered breathing - discussed options to assist with weight loss: combination of diet modification, cardiovascular and strength training exercises  Cardiovascular risk. - had an extensive discussion regarding the adverse health consequences related to untreated sleep disordered breathing - specifically discussed the risks for hypertension, coronary artery disease, cardiac dysrhythmias, cerebrovascular disease, and diabetes - lifestyle modification discussed  Safe driving practices. -  discussed how sleep disruption can increase risk of accidents, particularly when driving - safe driving practices were discussed  Therapies for obstructive sleep apnea. - if the sleep study shows significant sleep apnea, then various therapies for treatment were reviewed: CPAP, oral appliance, and surgical interventions    Time Spent Involved in Patient Care on Day of Examination:  36 minutes  Follow up:   Patient Instructions  Will arrange for home sleep study Will call to arrange for follow up after sleep study reviewed   Medication List:   Allergies as of 12/30/2021       Reactions   Lactose Intolerance (gi)    Vomiting, diarrhea, bloating        Medication List        Accurate as of December 30, 2021  9:59 AM. If you have any questions, ask your nurse or doctor.          celecoxib 100 MG capsule Commonly known as: CELEBREX TAKE 1 CAPSULE(100 MG) BY MOUTH TWICE DAILY   levothyroxine 150 MCG tablet Commonly known as: SYNTHROID TAKE 1 TABLET(150 MCG) BY MOUTH DAILY BEFORE AND BREAKFAST   multivitamin tablet Take 1 tablet by mouth daily.   omeprazole 40 MG capsule Commonly known as: PRILOSEC Take 40 mg by mouth daily.   Vitamin D-3 125 MCG (5000 UT) Tabs Take 1 capsule by mouth daily.        Signature:  Chesley Mires, MD [place] Pager - [phone] 12/30/2021, 9:59 AM

## 2022-01-02 ENCOUNTER — Ambulatory Visit (INDEPENDENT_AMBULATORY_CARE_PROVIDER_SITE_OTHER): Payer: 59 | Admitting: Internal Medicine

## 2022-01-02 ENCOUNTER — Ambulatory Visit: Payer: 59 | Admitting: Internal Medicine

## 2022-01-02 ENCOUNTER — Ambulatory Visit: Payer: 59 | Admitting: Orthopedic Surgery

## 2022-01-02 ENCOUNTER — Encounter: Payer: Self-pay | Admitting: Internal Medicine

## 2022-01-02 VITALS — BP 112/68 | HR 65 | Ht 67.0 in | Wt 186.6 lb

## 2022-01-02 DIAGNOSIS — M7542 Impingement syndrome of left shoulder: Secondary | ICD-10-CM

## 2022-01-02 DIAGNOSIS — Z23 Encounter for immunization: Secondary | ICD-10-CM

## 2022-01-02 DIAGNOSIS — M7532 Calcific tendinitis of left shoulder: Secondary | ICD-10-CM | POA: Insufficient documentation

## 2022-01-02 DIAGNOSIS — M67912 Unspecified disorder of synovium and tendon, left shoulder: Secondary | ICD-10-CM | POA: Diagnosis not present

## 2022-01-02 DIAGNOSIS — Z0000 Encounter for general adult medical examination without abnormal findings: Secondary | ICD-10-CM

## 2022-01-02 NOTE — Progress Notes (Signed)
Acute Office Visit  Subjective:     Patient ID: Stacy [name], female    DOB: 11/28/1960, 61 y.o.   MRN: [id number]  Chief Complaint  Patient presents with   Extremity Weakness    Not able to lift left arm above head. 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  She endorses left shoulder and neck pain.  Her symptoms began this weekend, although the neck pain has been gradually worsening over several weeks. 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  She noted limitation in her range of motion, notably abduction and internal rotation.  She is right-hand dominant and has largely been able to perform ADLs independently, but has needed her daughter's help in order to get dressed in the morning. She thought her symptoms were mostly due to her neck so she scheduled a massage yesterday. This seemed to improve her pain to some degree, but the range of motion in her left shoulder is still significantly reduced. She has been taking celebrex, motrin, and tylenol for pain relief.  She has also been performing neck exercises at home.  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 She denies numbness / tingling in her left upper extremity, pain in her left elbow / hand, no significant weakness just limited use due to pain.  Review of Systems  Musculoskeletal:        Left shoulder pain / reduced ROM  All other systems reviewed and are negative.     Objective:    BP 112/68   Pulse 65   Ht '5\' 7"'$  (1.702 m)   Wt 186 lb 9.6 oz (84.6 kg)   LMP 09/20/2016 (Approximate)   SpO2 99%   BMI 29.23 kg/m   Physical Exam Musculoskeletal:     Comments: Left shoulder exam: -No obvious deformity on inspection -There is tenderness palpation along the superior and lateral aspects of the left shoulder -Active abduction limited to 60 degrees degrees secondary to pain, I am  able to passively abduct the left shoulder to about 75 degrees -Limited IR/ER, flexion/extension due to pain -Positive empty can -Positive Hawkins -Strength reduced secondary to pain  Neck: ROM generally intact    No results found for any visits on 01/02/22.     Assessment & Plan:   Problem List Items Addressed This Visit       Musculoskeletal and Integument   Calcific tendinitis of left shoulder    Presenting today for evaluation of rather acute onset left shoulder pain.  She describes recent pain/stiffness in her neck, but her left shoulder pain began 2 days ago.  On exam her range of motion, abduction in particular is limited secondary to pain.  She can actively approach 60 degrees and I am able to passively abduct to 75 degrees before she experiences significant pain.  On chart review, it appears she was evaluated by Dr. Aline [name] with [facility] [name] for left shoulder pain of similar description.  X-rays at that time showed a possible calcification at the greater tuberosity/supraspinatus, concerning for calcific tendinitis.  She received a subacromial steroid injection at that time that relieved her pain. -We discussed various treatment options today.  I offered to refer her to formal physical therapy as this would be the mainstay of treatment.  We also discussed continuing home PT for now and if there is no improvement could consider formal  PT referral at a future date.  Lastly, I offered to refer her back to Dr. Aline [name] for subacromial steroid injection for immediate symptom relief.  Through shared decision-making, she was agreeable to formal PT.  If this does not improve her symptoms, she will call [facility] and follow-up with Dr. Aline [name] for further management. -She can continue as needed use of Celebrex and Tylenol      No orders of the defined types were placed in this encounter.  Return in about 25 days (around 01/27/2022).  Stacy Abraham, MD

## 2022-01-02 NOTE — Assessment & Plan Note (Signed)
Presenting today for evaluation of rather acute onset left shoulder pain.  She describes recent pain/stiffness in her neck, but her left shoulder pain began 2 days ago.  On exam her range of motion, abduction in particular is limited secondary to pain.  She can actively approach 60 degrees and I am able to passively abduct to 75 degrees before she experiences significant pain.  On chart review, it appears she was evaluated by Dr. Aline Brochure with [facility] [name] for left shoulder pain of similar description.  X-rays at that time showed a possible calcification at the greater tuberosity/supraspinatus, concerning for calcific tendinitis.  She received a subacromial steroid injection at that time that relieved her pain. -We discussed various treatment options today.  I offered to refer her to formal physical therapy as this would be the mainstay of treatment.  We also discussed continuing home PT for now and if there is no improvement could consider formal PT referral at a future date.  Lastly, I offered to refer her back to Dr. Aline Brochure for subacromial steroid injection for immediate symptom relief.  Through shared decision-making, she was agreeable to formal PT.  If this does not improve her symptoms, she will call [facility] and follow-up with Dr. Aline Brochure for further management. -She can continue as needed use of Celebrex and Tylenol

## 2022-01-02 NOTE — Progress Notes (Deleted)
   Acute Office Visit  Subjective:     Patient ID: Stacy [name], female    DOB: 1961-04-08, 61 y.o.   MRN: [id number]  No chief complaint on file.   HPI Patient is in today for ***  ROS      Objective:    LMP 09/20/2016 (Approximate)  {Vitals History (Optional):23777}  Physical Exam  No results found for any visits on 01/02/22.      Assessment & Plan:   Problem List Items Addressed This Visit   None   No orders of the defined types were placed in this encounter.   No follow-ups on file.  Johnette Abraham, MD

## 2022-01-02 NOTE — Patient Instructions (Signed)
It was a pleasure to see you today.  Thank you for giving [place] the opportunity to be involved in your care.  Below is a brief recap of your visit and next steps.  We will plan to see you again in 01/27/22 with Dr. Moshe Cipro  Summary Your exam seems most consistent with rotator cuff tendinopathy. I have referred you to formal physical therapy and provided additional home exercises  Next steps You have follow up with Dr Moshe Cipro next month.  Please let us know if you need anything before then

## 2022-01-03 ENCOUNTER — Ambulatory Visit (INDEPENDENT_AMBULATORY_CARE_PROVIDER_SITE_OTHER): Payer: 59

## 2022-01-03 ENCOUNTER — Ambulatory Visit (INDEPENDENT_AMBULATORY_CARE_PROVIDER_SITE_OTHER): Payer: 59 | Admitting: Orthopaedic Surgery

## 2022-01-03 ENCOUNTER — Encounter: Payer: Self-pay | Admitting: Orthopaedic Surgery

## 2022-01-03 VITALS — BP 123/72 | HR 70 | Ht 67.0 in | Wt 187.0 lb

## 2022-01-03 DIAGNOSIS — G8929 Other chronic pain: Secondary | ICD-10-CM | POA: Diagnosis not present

## 2022-01-03 DIAGNOSIS — M25512 Pain in left shoulder: Secondary | ICD-10-CM

## 2022-01-03 MED ORDER — METHYLPREDNISOLONE ACETATE 40 MG/ML IJ SUSP
40.0000 mg | Freq: Once | INTRAMUSCULAR | Status: AC
  Administered 2022-01-03: 40 mg via INTRA_ARTICULAR

## 2022-01-03 NOTE — Addendum Note (Signed)
Addended by: Obie Dredge A on: 01/03/2022 04:27 PM   Modules accepted: Orders

## 2022-01-03 NOTE — Progress Notes (Signed)
My shoulder hurts.  She awoke 12-31-21 with marked pain of the left shoulder and inability to move it much.  She denies any trauma or untoward event the day before.  She tried massage, took a Celebrex then a Motrin the next day.  She has used heat and ice.  Motion is better but it still hurts.  She is right hand dominant.  She has no numbness or swelling.  She has history of neck pain but her neck does not hurt.  ROM of the left shoulder is abduction 50, forward 60, internal 15, external 20, adduction full.  Extension is 5.  NV intact.  Grips normal.  X-rays were done of the left shoulder, reported separately.  Encounter Diagnosis  Name Primary?   Chronic left shoulder pain Yes   Her calcium deposit on the prior X-rays of 2022 have resolved.  I have shown her the X-rays.  PROCEDURE NOTE:  The patient request injection, verbal consent was obtained.  The left shoulder was prepped appropriately after time out was performed.   Sterile technique was observed and injection of 1 cc of DepoMedrol '40mg'$  with several cc's of plain xylocaine. Anesthesia was provided by ethyl chloride and a 20-gauge needle was used to inject the shoulder area. A posterior approach was used.  The injection was tolerated well.  A band aid dressing was applied.  The patient was advised to apply ice later today and tomorrow to the injection sight as needed.  Resume the Motrin.  Return in two weeks.  Exercise sheet given.  Call if any problem.  Precautions discussed.  Electronically Signed Sanjuana Kava, MD 9/12/20233:19 PM

## 2022-01-06 ENCOUNTER — Ambulatory Visit: Payer: 59 | Admitting: "Endocrinology

## 2022-01-13 ENCOUNTER — Ambulatory Visit: Payer: 59 | Admitting: Family Medicine

## 2022-01-13 ENCOUNTER — Ambulatory Visit: Payer: 59 | Admitting: "Endocrinology

## 2022-01-18 ENCOUNTER — Encounter: Payer: Self-pay | Admitting: Orthopaedic Surgery

## 2022-01-18 ENCOUNTER — Ambulatory Visit (INDEPENDENT_AMBULATORY_CARE_PROVIDER_SITE_OTHER): Payer: 59 | Admitting: Orthopaedic Surgery

## 2022-01-18 ENCOUNTER — Ambulatory Visit (HOSPITAL_COMMUNITY)
Admission: RE | Admit: 2022-01-18 | Discharge: 2022-01-18 | Disposition: A | Discharge status: Home / Self Care | Payer: 59 | Source: Ambulatory Visit | Attending: Orthopaedic Surgery | Admitting: Orthopaedic Surgery

## 2022-01-18 DIAGNOSIS — M25512 Pain in left shoulder: Secondary | ICD-10-CM | POA: Diagnosis present

## 2022-01-18 DIAGNOSIS — G8929 Other chronic pain: Secondary | ICD-10-CM | POA: Diagnosis present

## 2022-01-18 NOTE — Progress Notes (Signed)
My shoulder is hurting again.  She has had more pain in the left shoulder over the last several days.  The injection helped at first but now it is more painful.  She has pain lifting it overhead.  She has no numbness.  ROM of the left shoulder is decreased with pain past 90 abduction.  NV intact.  Grips good. ROM of neck is full.  Encounter Diagnosis  Name Primary?   Chronic left shoulder pain Yes   I will get MRI.  Return in one week.  Call if any problem.  Precautions discussed.  Electronically Signed Sanjuana Kava, MD 9/27/20238:20 AM

## 2022-01-20 ENCOUNTER — Ambulatory Visit: Payer: 59 | Admitting: Family Medicine

## 2022-01-27 ENCOUNTER — Ambulatory Visit: Payer: 59 | Admitting: Family Medicine

## 2022-01-27 ENCOUNTER — Other Ambulatory Visit: Payer: Self-pay

## 2022-01-27 DIAGNOSIS — E782 Mixed hyperlipidemia: Secondary | ICD-10-CM

## 2022-01-27 DIAGNOSIS — E559 Vitamin D deficiency, unspecified: Secondary | ICD-10-CM

## 2022-01-27 DIAGNOSIS — R7303 Prediabetes: Secondary | ICD-10-CM

## 2022-02-10 ENCOUNTER — Ambulatory Visit (HOSPITAL_COMMUNITY): Payer: 59 | Admitting: Occupational Therapy

## 2022-02-10 ENCOUNTER — Ambulatory Visit: Payer: 59 | Admitting: "Endocrinology

## 2022-02-24 ENCOUNTER — Ambulatory Visit (HOSPITAL_COMMUNITY): Payer: Self-pay | Attending: Internal Medicine | Admitting: Occupational Therapy

## 2022-02-24 ENCOUNTER — Encounter (HOSPITAL_COMMUNITY): Payer: Self-pay | Admitting: Occupational Therapy

## 2022-02-24 DIAGNOSIS — M25512 Pain in left shoulder: Secondary | ICD-10-CM

## 2022-02-24 DIAGNOSIS — R29898 Other symptoms and signs involving the musculoskeletal system: Secondary | ICD-10-CM

## 2022-02-24 DIAGNOSIS — M7542 Impingement syndrome of left shoulder: Secondary | ICD-10-CM | POA: Insufficient documentation

## 2022-02-24 DIAGNOSIS — M67912 Unspecified disorder of synovium and tendon, left shoulder: Secondary | ICD-10-CM | POA: Insufficient documentation

## 2022-02-24 DIAGNOSIS — M25612 Stiffness of left shoulder, not elsewhere classified: Secondary | ICD-10-CM

## 2022-02-24 NOTE — Patient Instructions (Signed)
Perform each exercise ____10-15____ reps. 2x day.   1) Protraction   Start by holding a wand or cane at chest height.  Next, slowly push the wand outwards in front of your body so that your elbows become fully straightened. Then, return to the original position.     2) Shoulder FLEXION   In the standing position, hold a wand/cane with both arms, palms down on both sides. Raise up the wand/cane allowing your unaffected arm to perform most of the effort. Your affected arm should be partially relaxed.      3) Internal/External ROTATION   In the standing position, hold a wand/cane with both hands keeping your elbows bent. Move your arms and wand/cane to one side.  Your affected arm should be partially relaxed while your unaffected arm performs most of the effort.       4) Shoulder ABDUCTION   While holding a wand/cane palm face up on the injured side and palm face down on the uninjured side, slowly raise up your injured arm to the side.        5) Horizontal Abduction/Adduction      Straight arms holding cane at shoulder height, bring cane to right, center, left. Repeat starting to left.   Copyright  V[employer]. All rights reserved.

## 2022-02-24 NOTE — Therapy (Signed)
OUTPATIENT OCCUPATIONAL THERAPY ORTHO EVALUATION  Patient Name: Stacy [name] MRN: [id number] DOB:Dec 11, 1960, 61 y.o., female Today's Date: 02/24/2022  PCP: Dr. [name] REFERRING PROVIDER: Dr. Marland Kitchen   OT End of Session - 02/24/22 1119     Visit Number 1    Number of Visits 4    Date for OT Re-Evaluation 03/26/22    Authorization Type Self-pay    OT Start Time 1121    OT Stop Time 1109    OT Time Calculation (min) 1428 min    Activity Tolerance Patient tolerated treatment well    Behavior During Therapy [facility] for tasks assessed/performed             Past Medical History:  Diagnosis Date   Abnormal uterine bleeding    Chronic leukopenia 08/17/2016   WBC = 3.5   DDD (degenerative disc disease), cervical    Dyslipidemia    Elevated serum creatinine 08/17/2016   GERD (gastroesophageal reflux disease)    History of esophageal dilatation    2013   History of ovarian cyst    Hypothyroidism    Lactose intolerance    OSA on CPAP    study done 04-08-2013   Pre-diabetes    Right thyroid nodule    SUI (stress urinary incontinence, female)    Tonsillar exudate 08/13/2014   Wears glasses    Past Surgical History:  Procedure Laterality Date   CESAREAN SECTION  1997   Twins-1 twin del.by c/s, 1 del.vaginally   COLONOSCOPY  04/04/2011   Internal hemorrhoids/Nl colonoscopy-SLIGHTLY TORTUOUS COLON   DILATATION & CURETTAGE/HYSTEROSCOPY WITH MYOSURE N/A 08/24/2016   Procedure: DILATATION & CURETTAGE/HYSTEROSCOPY WITH MYOSURE Possible Myosure ;  Surgeon: Nunzio Cobbs, MD;  Location: [place] [facility];  Service: Gynecology;  Laterality: N/A;   DILATION AND CURETTAGE OF UTERUS  1998   Seconday to miscarriage   ESOPHAGOGASTRODUODENOSCOPY  09/22/2011   Mild gastritis/Hiatal hernia/DYSPHAGIA DUE TO PEPTIC STRICTURE/UNCONTROLLED GERD   KNEE ARTHROSCOPY Right 2003   MALONEY DILATION  09/22/2011   Procedure: MALONEY DILATION;  Surgeon: Danie Binder, MD;  [facility];  Service: Endoscopy;  Laterality: N/A;   REPLACEMENT TOTAL KNEE Right 2022   SAVORY DILATION  09/22/2011   Procedure: SAVORY DILATION;  Surgeon: Danie Binder, MD;  Location: [facility];  Service: Endoscopy;  Laterality: N/A;   Patient Active Problem List   Diagnosis Date Noted   Calcific tendinitis of left shoulder 01/02/2022   Pain of left calf 07/01/2021   Snoring 07/01/2021   Excessive daytime sleepiness 07/01/2021   Arthrofibrosis of knee joint, right 12/26/2020   Mixed hyperlipidemia 12/18/2019   Sacroiliitis ([place]) 05/29/2019   Elevated serum creatinine 08/26/2018   Dyspepsia 06/03/2018   Vitamin D deficiency 04/05/2017   Primary hypothyroidism 03/11/2015   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   Polymenorrhea 08/13/2014   Pain in joint, ankle and foot 68/02/5725   Metabolic syndrome X 20/35/5974   Sleep disorder 02/01/2013   Prediabetes 01/30/2013   OA (osteoarthritis) of knee 10/03/2012   Cervical back pain with evidence of disc disease 09/24/2012   Hypothyroidism 09/21/2011   ABNORMAL ELECTROCARDIOGRAM 03/04/2010   Anemia 09/15/2009   LEUKOPENIA, CHRONIC 09/15/2009   Overweight (BMI 25.0-29.9) 04/24/2009   Dyslipidemia 05/23/2007   GERD 05/23/2007    ONSET DATE: 10//2023  REFERRING DIAG: left shoulder pain  THERAPY DIAG:  Acute pain of left shoulder - Plan: Ot plan of care cert/re-cert  Stiffness of left shoulder, not elsewhere classified -  Plan: Ot plan of care cert/re-cert  Other symptoms and signs involving the musculoskeletal system - Plan: Ot plan of care cert/re-cert  Rationale for Evaluation and Treatment: Rehabilitation  SUBJECTIVE:   SUBJECTIVE STATEMENT: "I've been doing exercises and it was getting better until about 4 days ago." Pt accompanied by: self  PERTINENT HISTORY: Pt is a 61 y/o female presenting with approximately a 1 month history of left shoulder and cervical neck pain. MRI showing moderate tendinosis of the  supraspinatus tendon, mild tendinosis of the infraspinatus tendon, and mild calcific tendinosis of the subscapularis tendon. Pt reports increased pain approximately 4 days ago for unknown cause, has been completing HEP from MD but has had more difficulty over the past few days. Pt was referred to occupational therapy for evaluation and treatment by Dr. Marland Kitchen.   PRECAUTIONS: None  WEIGHT BEARING RESTRICTIONS: No  PAIN:  Are you having pain? Yes: NPRS scale: 3/10 Pain location: left shoulder Pain description: sharp, radiating, aching Aggravating factors: unsure, certain movements Relieving factors: unsure  FALLS: Has patient fallen in last 6 months? No  PLOF: Independent  PATIENT GOALS: To have less pain and more movement in the left arm.    OBJECTIVE:   HAND DOMINANCE: Right  ADLs: Overall ADLs: Pt is having difficulty with fixing hair, dressing tasks, unable to reach overhead and behind the back, unable to complete any lifting tasks. 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 Pt is having difficulty with sleeping, has to sleep in the recliner.    UPPER EXTREMITY ROM:     Active ROM Left eval  Shoulder flexion 95  Shoulder abduction 62  Shoulder internal rotation 90  Shoulder external rotation 55  (Blank rows = not tested)     Passive ROM Left eval  Shoulder flexion 78  Shoulder abduction 88  Shoulder internal rotation 90  Shoulder external rotation 55  (Blank rows = not tested)   UPPER EXTREMITY MMT:     MMT Left eval  Shoulder flexion 3-/5  Shoulder abduction 3-/5  Shoulder internal rotation 5/5  Shoulder external rotation 5/5  (Blank rows = not tested)   COGNITION: Overall cognitive status: Within functional limits for tasks assessed   TODAY'S TREATMENT:                                                                                                                              02/24/22 -AA/ROM:  shoulder-protraction, flexion, horizontal abduction, abduction, 5 reps      PATIENT EDUCATION: Education details: AA/ROM Person educated: Patient Education method: Consulting civil engineer, Media planner, and Handouts Education comprehension: verbalized understanding and returned demonstration  HOME EXERCISE PROGRAM: Eval: AA/ROM  GOALS: Goals reviewed with patient? Yes  SHORT TERM GOALS: Target date: 03/24/2022    Pt will be provided with and educated on HEP to improve mobility and functional use of LUE during ADL completion.   Goal status: INITIAL  2.  Pt will decrease pain to 3/10 or less to improve ability to sleep in the bed for 2+ hours without waking due to pain.   Goal status: INITIAL  3.  Pt will increase LUE A/ROM to [facility] to improve ability to reach overhead and behind back during dressing and bathing tasks.   Goal status: INITIAL  4.  Pt will decrease fascial restrictions in LUE to min amounts to improve mobility required for reaching up and back to fix her hair.   Goal status: INITIAL  5.  Pt will increase strength in LUE to 4+/5 or greater to improve ability to perform lifting tasks at home during housework, meal preparation, etc.   Goal status: INITIAL   ASSESSMENT:  CLINICAL IMPRESSION: Patient is a 61 y.o. female who was seen today for occupational therapy evaluation for left shoulder pain. Pain has been present for approximately 1 month, had MRI indicating tendinosis throughout the rotator cuff. Pt with decreased mobility and increased pain in the LUE limiting ability to complete functional tasks at home and at work. Pt with limited tolerance to P/ROM however with AA/ROM pt achieving good ROM with less pain. Educated on HEP to begin until next session.   PERFORMANCE DEFICITS: in functional skills including ADLs, IADLs, dexterity, ROM, strength, pain, fascial restrictions, endurance, and UE functional use  IMPAIRMENTS: are limiting patient from ADLs, IADLs, rest and  sleep, work, and leisure.   COMORBIDITIES: has no other co-morbidities that affects occupational performance. Patient will benefit from skilled OT to address above impairments and improve overall function.  MODIFICATION OR ASSISTANCE TO COMPLETE EVALUATION: No modification of tasks or assist necessary to complete an evaluation.  OT OCCUPATIONAL PROFILE AND HISTORY: Problem focused assessment: Including review of records relating to presenting problem.  CLINICAL DECISION MAKING: LOW - limited treatment options, no task modification necessary  REHAB POTENTIAL: Good  EVALUATION COMPLEXITY: Low      PLAN:  OT FREQUENCY: 1x/week  OT DURATION: 4 weeks  PLANNED INTERVENTIONS: self care/ADL training, therapeutic exercise, therapeutic activity, manual therapy, passive range of motion, electrical stimulation, ultrasound, moist heat, cryotherapy, and patient/family education  CONSULTED AND AGREED WITH PLAN OF CARE: Patient  PLAN FOR NEXT SESSION: follow up on HEP, P/ROM, AA/ROM, shoulder stretches, scapular mobility and strengthening   Guadelupe Sabin, OTR/L  [phone] 02/24/2022, 11:21 AM

## 2022-03-03 ENCOUNTER — Encounter (HOSPITAL_COMMUNITY): Payer: Self-pay | Admitting: Occupational Therapy

## 2022-03-04 ENCOUNTER — Encounter: Payer: Self-pay | Admitting: Nurse Practitioner

## 2022-03-04 ENCOUNTER — Ambulatory Visit
Admission: EM | Admit: 2022-03-04 | Discharge: 2022-03-04 | Disposition: A | Discharge status: Home / Self Care | Payer: Self-pay | Attending: Nurse Practitioner | Admitting: Nurse Practitioner

## 2022-03-04 DIAGNOSIS — J069 Acute upper respiratory infection, unspecified: Secondary | ICD-10-CM

## 2022-03-04 MED ORDER — PROMETHAZINE-DM 6.25-15 MG/5ML PO SYRP: 5.0000 mL | ORAL_SOLUTION | Freq: Four times a day (QID) | ORAL | 0 refills | Status: DC | PRN

## 2022-03-04 MED ORDER — ALBUTEROL SULFATE HFA 108 (90 BASE) MCG/ACT IN AERS: 2.0000 | INHALATION_SPRAY | Freq: Four times a day (QID) | RESPIRATORY_TRACT | 0 refills | Status: DC | PRN

## 2022-03-04 MED ORDER — FLUTICASONE PROPIONATE 50 MCG/ACT NA SUSP: 2.0000 | Freq: Every day | NASAL | 0 refills | Status: DC

## 2022-03-04 MED ORDER — CETIRIZINE HCL 10 MG PO TABS: 10.0000 mg | ORAL_TABLET | Freq: Every day | ORAL | 0 refills | Status: DC

## 2022-03-04 MED ORDER — AMOXICILLIN-POT CLAVULANATE 875-125 MG PO TABS: 1.0000 | ORAL_TABLET | Freq: Two times a day (BID) | ORAL | 0 refills | Status: DC

## 2022-03-04 NOTE — ED Triage Notes (Signed)
Pt states cough and congestion for the past 5 days state she has taken 3 home Covid tests that were negative. Has been taking OTC meds with some relief.

## 2022-03-04 NOTE — Discharge Instructions (Addendum)
Take medication as prescribed.  If the antihistamine (cetirizine) causes you to have side effects, stop the medication. As discussed, an antibiotic has been sent to your preferred pharmacy for pickup on 03/09/2022 if symptoms worsen or do not improve. Increase fluids and allow for plenty of rest. Recommend using a humidifier at bedtime during sleep to help with cough and also recommend sleeping elevated on 2 pillows while cough symptoms persist. Recommend using normal saline nasal spray throughout the day to help with any nasal congestion. Follow-up with your primary care physician if symptoms do not improve with this treatment regimen. Follow-up as needed.

## 2022-03-04 NOTE — ED Provider Notes (Signed)
[facility]    CSN: [id number] Arrival date & time: 03/04/22  1034      History   Chief Complaint Chief Complaint  Patient presents with   Cough    HPI Stacy [name] is a 61 y.o. female.   The history is provided by the patient.   Presents for complaints of cough and chest congestion that been present for the last several days.  Patient states that she has not had fever, chills, sore throat, headache, ear pain, or GI symptoms.  She does admit to episodes of shortness of breath and wheezing.  She states that she has been trying over-the-counter medications and home remedies without relief.  She states that she did attend an event prior to her symptoms started and was wondering if she had contracted COVID.  She took 3 home COVID test, with the most recent this morning, all were negative.  Past Medical History:  Diagnosis Date   Abnormal uterine bleeding    Chronic leukopenia 08/17/2016   WBC = 3.5   DDD (degenerative disc disease), cervical    Dyslipidemia    Elevated serum creatinine 08/17/2016   GERD (gastroesophageal reflux disease)    History of esophageal dilatation    2013   History of ovarian cyst    Hypothyroidism    Lactose intolerance    OSA on CPAP    study done 04-08-2013   Pre-diabetes    Right thyroid nodule    SUI (stress urinary incontinence, female)    Tonsillar exudate 08/13/2014   Wears glasses     Patient Active Problem List   Diagnosis Date Noted   Calcific tendinitis of left shoulder 01/02/2022   Pain of left calf 07/01/2021   Snoring 07/01/2021   Excessive daytime sleepiness 07/01/2021   Arthrofibrosis of knee joint, right 12/26/2020   Mixed hyperlipidemia 12/18/2019   Sacroiliitis (Nellie) 05/29/2019   Elevated serum creatinine 08/26/2018   Dyspepsia 06/03/2018   Vitamin D deficiency 04/05/2017   Primary hypothyroidism 03/11/2015   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   Polymenorrhea 08/13/2014   Pain in joint, ankle  and foot 29/51/8841   Metabolic syndrome X 66/09/3014   Sleep disorder 02/01/2013   Prediabetes 01/30/2013   OA (osteoarthritis) of knee 10/03/2012   Cervical back pain with evidence of disc disease 09/24/2012   Hypothyroidism 09/21/2011   ABNORMAL ELECTROCARDIOGRAM 03/04/2010   Anemia 09/15/2009   LEUKOPENIA, CHRONIC 09/15/2009   Overweight (BMI 25.0-29.9) 04/24/2009   Dyslipidemia 05/23/2007   GERD 05/23/2007    Past Surgical History:  Procedure Laterality Date   CESAREAN SECTION  1997   Twins-1 twin del.by c/s, 1 del.vaginally   COLONOSCOPY  04/04/2011   Internal hemorrhoids/Nl colonoscopy-SLIGHTLY TORTUOUS COLON   DILATATION & CURETTAGE/HYSTEROSCOPY WITH MYOSURE N/A 08/24/2016   Procedure: DILATATION & CURETTAGE/HYSTEROSCOPY WITH MYOSURE Possible Myosure ;  Surgeon: Nunzio Cobbs, MD;  Location: [facility];  Service: Gynecology;  Laterality: N/A;   DILATION AND CURETTAGE OF UTERUS  1998   Seconday to miscarriage   ESOPHAGOGASTRODUODENOSCOPY  09/22/2011   Mild gastritis/Hiatal hernia/DYSPHAGIA DUE TO PEPTIC STRICTURE/UNCONTROLLED GERD   KNEE ARTHROSCOPY Right 2003   MALONEY DILATION  09/22/2011   Procedure: MALONEY DILATION;  Surgeon: Danie Binder, MD;  Location: [facility];  Service: Endoscopy;  Laterality: N/A;   REPLACEMENT TOTAL KNEE Right 2022   SAVORY DILATION  09/22/2011   Procedure: SAVORY DILATION;  Surgeon: Danie Binder, MD;  Location: [facility];  Service: Endoscopy;  Laterality: N/A;    OB History     Gravida  4   Para  3   Term  3   Preterm      AB  1   Living  4      SAB  1   IAB      Ectopic      Multiple  1   Live Births  4            Home Medications    Prior to Admission medications   Medication Sig Start Date End Date Taking? Authorizing Provider  albuterol (VENTOLIN HFA) 108 (90 Base) MCG/ACT inhaler Inhale 2 puffs into the lungs every 6 (six) hours as needed for wheezing or shortness of breath. 03/04/22   Yes Bora Bost-Warren, Alda Lea, NP  amoxicillin-clavulanate (AUGMENTIN) 875-125 MG tablet Take 1 tablet by mouth every 12 (twelve) hours. 03/09/22  Yes Rashad Auld-Warren, Alda Lea, NP  cetirizine (ZYRTEC) 10 MG tablet Take 1 tablet (10 mg total) by mouth daily. 03/04/22  Yes Daneli Butkiewicz-Warren, Alda Lea, NP  fluticasone (FLONASE) 50 MCG/ACT nasal spray Place 2 sprays into both nostrils daily. 03/04/22  Yes Deaveon Schoen-Warren, Alda Lea, NP  promethazine-dextromethorphan (PROMETHAZINE-DM) 6.25-15 MG/5ML syrup Take 5 mLs by mouth 4 (four) times daily as needed for cough. 03/04/22  Yes Dominie Benedick-Warren, Alda Lea, NP  celecoxib (CELEBREX) 100 MG capsule TAKE 1 CAPSULE(100 MG) BY MOUTH TWICE DAILY 08/29/21   Carole Civil, MD  Cholecalciferol (VITAMIN D-3) 125 MCG (5000 UT) TABS Take 1 capsule by mouth daily. 06/12/19   Cassandria Anger, MD  levothyroxine (SYNTHROID) 150 MCG tablet TAKE 1 TABLET(150 MCG) BY MOUTH DAILY BEFORE AND BREAKFAST 07/11/21   Cassandria Anger, MD  Multiple Vitamin (MULTIVITAMIN) tablet Take 1 tablet by mouth daily.    [provider]  omeprazole (PRILOSEC) 40 MG capsule Take 40 mg by mouth daily. 11/27/19   [provider]    Family History Family History  Problem Relation Age of Onset   Hypertension Mother    Angina Mother        MVP. h/o intestinal polyps possibly   Hyperlipidemia Mother    Transient ischemic attack Mother    Hypertension Father    Hyperlipidemia Father        glucose intolerant   Diabetes Father    GER disease Brother    Diabetes Brother    Diabetes Maternal Grandfather    Hyperlipidemia Paternal Grandmother    Thyroid disease Paternal Grandmother    Hyperlipidemia Paternal Grandfather    Thyroid disease Brother        hyperthyroid   Colon cancer Neg Hx    Breast cancer Neg Hx    Ovarian cancer Neg Hx    Anesthesia problems Neg Hx     Social History Social History   Tobacco Use   Smoking status: Never   Smokeless tobacco:  Never  Vaping Use   Vaping Use: Never used  Substance Use Topics   Alcohol use: No   Drug use: No     Allergies   Lactose intolerance (gi)   Review of Systems Review of Systems Per HPI  Physical Exam Triage Vital Signs ED Triage Vitals  Enc Vitals Group     BP 03/04/22 1056 (!) 147/78     Pulse Rate 03/04/22 1056 68     Resp 03/04/22 1056 16     Temp 03/04/22 1056 98.7 F (37.1 C)     Temp Source 03/04/22 1056 Oral  SpO2 03/04/22 1056 96 %     Weight --      Height --      Head Circumference --      Peak Flow --      Pain Score 03/04/22 1057 0     Pain Loc --      Pain Edu? --      Excl. in [place]? --    No data found.  Updated Vital Signs BP (!) 147/78   Pulse 68   Temp 98.7 F (37.1 C) (Oral)   Resp 16   LMP 09/20/2016 (Approximate)   SpO2 96%   Visual Acuity Right Eye Distance:   Left Eye Distance:   Bilateral Distance:    Right Eye Near:   Left Eye Near:    Bilateral Near:     Physical Exam Vitals and nursing note reviewed.  Constitutional:      General: She is not in acute distress.    Appearance: Normal appearance.  HENT:     Head: Normocephalic.     Right Ear: Tympanic membrane, ear canal and external ear normal.     Left Ear: Tympanic membrane, ear canal and external ear normal.     Nose: Nose normal.     Right Turbinates: Enlarged and swollen.     Left Turbinates: Enlarged and swollen.     Right Sinus: No maxillary sinus tenderness or frontal sinus tenderness.     Left Sinus: No maxillary sinus tenderness or frontal sinus tenderness.     Mouth/Throat:     Lips: Pink.     Mouth: Mucous membranes are moist.     Pharynx: Oropharynx is clear. Uvula midline. Posterior oropharyngeal erythema present. No pharyngeal swelling, oropharyngeal exudate or uvula swelling.     Comments: Cobblestoning present on posterior tongue Eyes:     Extraocular Movements: Extraocular movements intact.     Conjunctiva/sclera: Conjunctivae normal.     Pupils:  Pupils are equal, round, and reactive to light.  Cardiovascular:     Rate and Rhythm: Normal rate and regular rhythm.     Pulses: Normal pulses.     Heart sounds: Normal heart sounds.  Pulmonary:     Effort: Pulmonary effort is normal.     Breath sounds: Normal breath sounds.  Abdominal:     General: Bowel sounds are normal.     Palpations: Abdomen is soft.     Tenderness: There is no abdominal tenderness.  Musculoskeletal:     Cervical back: Normal range of motion.  Skin:    General: Skin is warm and dry.  Neurological:     General: No focal deficit present.     Mental Status: She is alert and oriented to person, place, and time.  Psychiatric:        Mood and Affect: Mood normal.        Behavior: Behavior normal.      UC Treatments / Results  Labs (all labs ordered are listed, but only abnormal results are displayed) Labs Reviewed - No data to display  EKG   Radiology No results found.  Procedures Procedures (including critical care time)  Medications Ordered in UC Medications - No data to display  Initial Impression / Assessment and Plan / UC Course  I have reviewed the triage vital signs and the nursing notes.  Pertinent labs & imaging results that were available during my care of the patient were reviewed by me and considered in my medical decision making (see chart for  details).  Presents for complaints of a 5-day history of cough and chest congestion.  On exam, her vital signs are stable, although she is mildly tachycardic, she is in no acute distress.  Differential diagnoses based on her exam include allergic rhinitis versus viral upper respiratory infection.  We will treat patient with cetirizine 10 mg and fluticasone 50 mcg for postnasal drainage.  For the cough, patient was prescribed Promethazine DM and an albuterol inhaler if she experiences shortness of breath or wheezing.  Patient was advised that we would like to approach her symptoms with a watch and  wait strategy.  If symptoms do not improve by 03/09/2022, she can pick up the prescription for Augmentin at her preferred pharmacy.  Patient was in agreement with this plan of care.  Supportive care recommendations were provided.  Patient verbalizes understanding.  All questions were answered.  Patient is stable for discharge. Final Clinical Impressions(s) / UC Diagnoses   Final diagnoses:  None     Discharge Instructions      Take medication as prescribed.  If the antihistamine (cetirizine) causes you to have side effects, stop the medication. As discussed, an antibiotic has been sent to your preferred pharmacy for pickup on 03/09/2022 if symptoms worsen or do not improve. Increase fluids and allow for plenty of rest. Recommend using a humidifier at bedtime during sleep to help with cough and also recommend sleeping elevated on 2 pillows while cough symptoms persist. Recommend using normal saline nasal spray throughout the day to help with any nasal congestion. Follow-up with your primary care physician if symptoms do not improve with this treatment regimen. Follow-up as needed.     ED Prescriptions     Medication Sig Dispense Auth. Provider   promethazine-dextromethorphan (PROMETHAZINE-DM) 6.25-15 MG/5ML syrup Take 5 mLs by mouth 4 (four) times daily as needed for cough. 140 mL Akeem Heppler-Warren, Alda Lea, NP   fluticasone (FLONASE) 50 MCG/ACT nasal spray Place 2 sprays into both nostrils daily. 16 g Sania Noy-Warren, Alda Lea, NP   cetirizine (ZYRTEC) 10 MG tablet Take 1 tablet (10 mg total) by mouth daily. 30 tablet Shantal Roan-Warren, Alda Lea, NP   albuterol (VENTOLIN HFA) 108 (90 Base) MCG/ACT inhaler Inhale 2 puffs into the lungs every 6 (six) hours as needed for wheezing or shortness of breath. 8 g Arminta Gamm-Warren, Alda Lea, NP   amoxicillin-clavulanate (AUGMENTIN) 875-125 MG tablet Take 1 tablet by mouth every 12 (twelve) hours. 14 tablet Ebony Rickel-Warren, Alda Lea, NP      PDMP not  reviewed this encounter.   Tish Men, NP 03/04/22 1140

## 2022-03-10 ENCOUNTER — Ambulatory Visit: Payer: 59 | Admitting: Family Medicine

## 2022-03-10 ENCOUNTER — Encounter (HOSPITAL_COMMUNITY): Payer: Self-pay | Admitting: Occupational Therapy

## 2022-03-24 ENCOUNTER — Encounter (HOSPITAL_COMMUNITY): Payer: Self-pay | Admitting: Occupational Therapy

## 2022-03-31 ENCOUNTER — Encounter (HOSPITAL_COMMUNITY): Payer: Self-pay | Admitting: Occupational Therapy

## 2022-06-22 ENCOUNTER — Encounter: Payer: Self-pay | Admitting: Radiology

## 2022-08-25 ENCOUNTER — Encounter: Payer: Self-pay | Admitting: Family Medicine

## 2022-09-01 ENCOUNTER — Ambulatory Visit (INDEPENDENT_AMBULATORY_CARE_PROVIDER_SITE_OTHER): Payer: PRIVATE HEALTH INSURANCE | Admitting: Family Medicine

## 2022-09-01 ENCOUNTER — Encounter: Payer: Self-pay | Admitting: Family Medicine

## 2022-09-01 VITALS — BP 130/80 | HR 72 | Ht 67.0 in | Wt 193.1 lb

## 2022-09-01 DIAGNOSIS — R1319 Other dysphagia: Secondary | ICD-10-CM | POA: Diagnosis not present

## 2022-09-01 DIAGNOSIS — Z1231 Encounter for screening mammogram for malignant neoplasm of breast: Secondary | ICD-10-CM

## 2022-09-01 DIAGNOSIS — E559 Vitamin D deficiency, unspecified: Secondary | ICD-10-CM

## 2022-09-01 DIAGNOSIS — R7303 Prediabetes: Secondary | ICD-10-CM

## 2022-09-01 DIAGNOSIS — K219 Gastro-esophageal reflux disease without esophagitis: Secondary | ICD-10-CM

## 2022-09-01 DIAGNOSIS — R1011 Right upper quadrant pain: Secondary | ICD-10-CM

## 2022-09-01 DIAGNOSIS — E66811 Obesity, class 1: Secondary | ICD-10-CM

## 2022-09-01 DIAGNOSIS — G4733 Obstructive sleep apnea (adult) (pediatric): Secondary | ICD-10-CM

## 2022-09-01 DIAGNOSIS — E782 Mixed hyperlipidemia: Secondary | ICD-10-CM

## 2022-09-01 DIAGNOSIS — E669 Obesity, unspecified: Secondary | ICD-10-CM | POA: Diagnosis not present

## 2022-09-01 DIAGNOSIS — Z0001 Encounter for general adult medical examination with abnormal findings: Secondary | ICD-10-CM

## 2022-09-01 DIAGNOSIS — R140 Abdominal distension (gaseous): Secondary | ICD-10-CM

## 2022-09-01 DIAGNOSIS — Z23 Encounter for immunization: Secondary | ICD-10-CM

## 2022-09-01 DIAGNOSIS — E038 Other specified hypothyroidism: Secondary | ICD-10-CM

## 2022-09-01 MED ORDER — OMEPRAZOLE 40 MG PO CPDR: 40.0000 mg | DELAYED_RELEASE_CAPSULE | Freq: Every day | ORAL | 2 refills | Status: DC

## 2022-09-01 NOTE — Progress Notes (Unsigned)
    Stacy [name]     MRN: [id number]      DOB: 23-Oct-1960  Chief Complaint  Patient presents with   Annual Exam    CPE, pap appt scheduled for June     Solid dysphagia, stomach pain with eating excessive bloating and belchilg, fatty  food intolerance HPI: Patient is in for annual physical exam. No other health concerns are expressed or addressed at the visit. Recent labs,  are reviewed. Immunization is reviewed , and  updated if needed.   PE: Pleasant  female, alert and oriented x 3, in no cardio-pulmonary distress. Afebrile. HEENT No facial trauma or asymetry. Sinuses non tender.  Extra occullar muscles intact.. External ears normal, . Neck: supple, no adenopathy,JVD or thyromegaly.No bruits.  Chest: Clear to ascultation bilaterally.No crackles or wheezes. Non tender to palpation  Breast: No asymetry,no masses or lumps. No tenderness. No nipple discharge or inversion. No axillary or supraclavicular adenopathy  Cardiovascular system; Heart sounds normal,  S1 and  S2 ,no S3.  No murmur, or thrill. Apical beat not displaced Peripheral pulses normal.  Abdomen: Soft, non tender, no organomegaly or masses. No bruits. Bowel sounds normal. No guarding, tenderness or rebound.   GU: External genitalia normal female genitalia , normal female distribution of hair. No lesions. Urethral meatus normal in size, no  Prolapse, no lesions visibly  Present. Bladder non tender. Vagina pink and moist , with no visible lesions , discharge present . Adequate pelvic support no  cystocele or rectocele noted Cervix pink and appears healthy, no lesions or ulcerations noted, no discharge noted from os Uterus normal size, no adnexal masses, no cervical motion or adnexal tenderness.   Musculoskeletal exam: Full ROM of spine, hips , shoulders and knees. No deformity ,swelling or crepitus noted. No muscle wasting or atrophy.   Neurologic: Cranial nerves 2 to 12 intact. Power, tone  ,sensation and reflexes normal throughout. No disturbance in gait. No tremor.  Skin: Intact, no ulceration, erythema , scaling or rash noted. Pigmentation normal throughout  Psych; Normal mood and affect. Judgement and concentration normal   Assessment & Plan:  No problem-specific Assessment & Plan notes found for this encounter.

## 2022-09-01 NOTE — Patient Instructions (Addendum)
F/U in 3 months, call if you need me sooner  TdAP today  Please schedule mammogram at checkout  RSV is recommended and is at your pharmacy  Fasting CBC, lipid,  TSH,Free T4, Free T3,  HBA1C, vit D next week asap  You are referred to Pulmonary re CPAP  You are referred to GI  PLEASE stop caffeine and resume omeprazole  You are referred for an [place] of your gall bladder, we will call with appt info  It is important that you exercise regularly at least 30 minutes 5 times a week. If you develop chest pain, have severe difficulty breathing, or feel very tired, stop exercising immediately and seek medical attention  Thanks for choosing [facility], we consider it a privelige to serve you.

## 2022-09-04 DIAGNOSIS — R1011 Right upper quadrant pain: Secondary | ICD-10-CM | POA: Insufficient documentation

## 2022-09-04 DIAGNOSIS — R1319 Other dysphagia: Secondary | ICD-10-CM | POA: Insufficient documentation

## 2022-09-04 DIAGNOSIS — G4733 Obstructive sleep apnea (adult) (pediatric): Secondary | ICD-10-CM | POA: Insufficient documentation

## 2022-09-04 DIAGNOSIS — Z0001 Encounter for general adult medical examination with abnormal findings: Secondary | ICD-10-CM | POA: Insufficient documentation

## 2022-09-04 NOTE — Assessment & Plan Note (Signed)
Refer pulmonary for re evaluation , rx and management

## 2022-09-04 NOTE — Assessment & Plan Note (Signed)
Annual exam as documented. Counseling done  re healthy lifestyle involving commitment to 150 minutes exercise per week, heart healthy diet, and attaining healthy weight.The importance of adequate sleep also discussed. Changes in health habits are decided on by the patient with goals and time frames  set for achieving them. Immunization and cancer screening needs are specifically addressed at this visit. 

## 2022-09-04 NOTE — Assessment & Plan Note (Signed)
Re eval gall bladder

## 2022-09-04 NOTE — Assessment & Plan Note (Signed)
Updated lab needed at/ before next visit. Needs to return to Endo for management and will schedule appt

## 2022-09-04 NOTE — Assessment & Plan Note (Addendum)
  Patient re-educated about  the importance of commitment to a  minimum of 150 minutes of exercise per week as able.  The importance of healthy food choices with portion control discussed, as well as eating regularly and within a 12 hour window most days. The need to choose "clean , green" food 50 to 75% of the time is discussed, as well as to make water the primary drink and set a goal of 64 ounces water daily.       09/01/2022    2:47 PM 01/03/2022    2:48 PM 01/02/2022    1:31 PM  Weight /BMI  Weight 193 lb 1.3 oz 187 lb 186 lb 9.6 oz  Height 5\' 7"  (1.702 m) 5\' 7"  (1.702 m) 5\' 7"  (1.702 m)  BMI 30.24 kg/m2 29.29 kg/m2 29.23 kg/m2    Deteriorated , she is to start phentermine once TSH is obtained and is within normal

## 2022-09-04 NOTE — Assessment & Plan Note (Signed)
Stop caffeine, weight loss , resume PPI and GI eval

## 2022-09-20 ENCOUNTER — Ambulatory Visit (HOSPITAL_COMMUNITY): Payer: PRIVATE HEALTH INSURANCE

## 2022-09-25 ENCOUNTER — Ambulatory Visit (HOSPITAL_COMMUNITY)
Admission: RE | Admit: 2022-09-25 | Discharge: 2022-09-25 | Disposition: A | Discharge status: Home / Self Care | Payer: PRIVATE HEALTH INSURANCE | Source: Ambulatory Visit | Attending: Family Medicine | Admitting: Family Medicine

## 2022-09-25 DIAGNOSIS — Z1231 Encounter for screening mammogram for malignant neoplasm of breast: Secondary | ICD-10-CM | POA: Insufficient documentation

## 2022-09-26 ENCOUNTER — Other Ambulatory Visit: Payer: Self-pay | Admitting: "Endocrinology

## 2022-09-27 LAB — VITAMIN D 25 HYDROXY (VIT D DEFICIENCY, FRACTURES): Vit D, 25-Hydroxy: 40.9 ng/mL (ref 30.0–100.0)

## 2022-09-27 LAB — CBC
Hematocrit: 35 % (ref 34.0–46.6)
Hemoglobin: 11.8 g/dL (ref 11.1–15.9)
MCH: 29 pg (ref 26.6–33.0)
MCHC: 33.7 g/dL (ref 31.5–35.7)
MCV: 86 fL (ref 79–97)
Platelets: 176 10*3/uL (ref 150–450)
RBC: 4.07 x10E6/uL (ref 3.77–5.28)
RDW: 12.8 % (ref 11.7–15.4)
WBC: 4 10*3/uL (ref 3.4–10.8)

## 2022-09-27 LAB — TSH: TSH: 0.291 u[IU]/mL — ABNORMAL LOW (ref 0.450–4.500)

## 2022-09-27 LAB — LIPID PANEL
Chol/HDL Ratio: 4 ratio (ref 0.0–4.4)
Cholesterol, Total: 183 mg/dL (ref 100–199)
HDL: 46 mg/dL (ref >39)
LDL Chol Calc (NIH): 109 mg/dL — ABNORMAL HIGH (ref 0–99)
Triglycerides: 158 mg/dL — ABNORMAL HIGH (ref 0–149)
VLDL Cholesterol Cal: 28 mg/dL (ref 5–40)

## 2022-09-27 LAB — T4, FREE: Free T4: 1.69 ng/dL (ref 0.82–1.77)

## 2022-09-27 LAB — HEMOGLOBIN A1C
Est. average glucose Bld gHb Est-mCnc: 140 mg/dL
Hgb A1c MFr Bld: 6.5 % — ABNORMAL HIGH (ref 4.8–5.6)

## 2022-09-27 LAB — T3, FREE: T3, Free: 3.3 pg/mL (ref 2.0–4.4)

## 2022-09-29 ENCOUNTER — Encounter: Payer: Self-pay | Admitting: Primary Care

## 2022-09-29 ENCOUNTER — Ambulatory Visit (INDEPENDENT_AMBULATORY_CARE_PROVIDER_SITE_OTHER): Payer: PRIVATE HEALTH INSURANCE | Admitting: Primary Care

## 2022-09-29 VITALS — BP 122/82 | HR 61 | Temp 97.4°F | Ht 67.0 in | Wt 192.0 lb

## 2022-09-29 DIAGNOSIS — G4733 Obstructive sleep apnea (adult) (pediatric): Secondary | ICD-10-CM | POA: Diagnosis not present

## 2022-09-29 NOTE — Patient Instructions (Signed)
We will get home sleep study to re-evaluate OSA and then can send an order in for new CPAP Will be contacted by either our office or [facility] to schedule home sleep study, this can take approximately 4 to 6 weeks.  Please let us know if you have not heard regarding scheduling home sleep study  Follow-up: 3-4 months with [facility] NP   Sleep Apnea Sleep apnea is a condition in which breathing pauses or becomes shallow during sleep. People with sleep apnea usually snore loudly. They may have times when they gasp and stop breathing for 10 seconds or more during sleep. This may happen many times during the night. Sleep apnea disrupts your sleep and keeps your body from getting the rest that it needs. This condition can increase your risk of certain health problems, including: Heart attack. Stroke. Obesity. Type 2 diabetes. Heart failure. Irregular heartbeat. High blood pressure. The goal of treatment is to help you breathe normally again. What are the causes?  The most common cause of sleep apnea is a collapsed or blocked airway. There are three kinds of sleep apnea: Obstructive sleep apnea. This kind is caused by a blocked or collapsed airway. Central sleep apnea. This kind happens when the part of the brain that controls breathing does not send the correct signals to the muscles that control breathing. Mixed sleep apnea. This is a combination of obstructive and central sleep apnea. What increases the risk? You are more likely to develop this condition if you: Are overweight. Smoke. Have a smaller than normal airway. Are older. Are female. Drink alcohol. Take sedatives or tranquilizers. Have a family history of sleep apnea. Have a tongue or tonsils that are larger than normal. What are the signs or symptoms? Symptoms of this condition include: Trouble staying asleep. Loud snoring. Morning headaches. Waking up gasping. Dry mouth or sore throat in the morning. Daytime sleepiness  and tiredness. If you have daytime fatigue because of sleep apnea, you may be more likely to have: Trouble concentrating. Forgetfulness. Irritability or mood swings. Personality changes. Feelings of depression. Sexual dysfunction. This may include loss of interest if you are female, or erectile dysfunction if you are female. How is this diagnosed? This condition may be diagnosed with: A medical history. A physical exam. A series of tests that are done while you are sleeping (sleep study). These tests are usually done in a sleep lab, but they may also be done at home. How is this treated? Treatment for this condition aims to restore normal breathing and to ease symptoms during sleep. It may involve managing health issues that can affect breathing, such as high blood pressure or obesity. Treatment may include: Sleeping on your side. Using a decongestant if you have nasal congestion. Avoiding the use of depressants, including alcohol, sedatives, and narcotics. Losing weight if you are overweight. Making changes to your diet. Quitting smoking. Using a device to open your airway while you sleep, such as: An oral appliance. This is a custom-made mouthpiece that shifts your lower jaw forward. A continuous positive airway pressure (CPAP) device. This device blows air through a mask when you breathe out (exhale). A nasal expiratory positive airway pressure (EPAP) device. This device has valves that you put into each nostril. A bi-level positive airway pressure (BIPAP) device. This device blows air through a mask when you breathe in (inhale) and breathe out (exhale). Having surgery if other treatments do not work. During surgery, excess tissue is removed to create a wider airway.  Follow these instructions at home: Lifestyle Make any lifestyle changes that your health care provider recommends. Eat a healthy, well-balanced diet. Take steps to lose weight if you are overweight. Avoid using  depressants, including alcohol, sedatives, and narcotics. Do not use any products that contain nicotine or tobacco. These products include cigarettes, chewing tobacco, and vaping devices, such as e-cigarettes. If you need help quitting, ask your health care provider. General instructions Take over-the-counter and prescription medicines only as told by your health care provider. If you were given a device to open your airway while you sleep, use it only as told by your health care provider. If you are having surgery, make sure to tell your health care provider you have sleep apnea. You may need to bring your device with you. Keep all follow-up visits. This is important. Contact a health care provider if: The device that you received to open your airway during sleep is uncomfortable or does not seem to be working. Your symptoms do not improve. Your symptoms get worse. Get help right away if: You develop: Chest pain. Shortness of breath. Discomfort in your back, arms, or stomach. You have: Trouble speaking. Weakness on one side of your body. Drooping in your face. These symptoms may represent a serious problem that is an emergency. Do not wait to see if the symptoms will go away. Get medical help right away. Call your local emergency services (911 in the [place] Do not drive yourself to the hospital. Summary Sleep apnea is a condition in which breathing pauses or becomes shallow during sleep. The most common cause is a collapsed or blocked airway. The goal of treatment is to restore normal breathing and to ease symptoms during sleep. This information is not intended to replace advice given to you by your health care provider. Make sure you discuss any questions you have with your health care provider. Document Revised: 11/17/2020 Document Reviewed: 03/19/2020 Elsevier Patient Education  2024 [employer].

## 2022-09-29 NOTE — Assessment & Plan Note (Addendum)
-   Hx moderate sleep apnea, AHI 19/hour. Original sleep study was 10 years ago. Current CPAP machine is out of date, not wearing consistently but reports benefit from use. Needs home sleep study to re-evaluate OSA prior to placing an order for her to receive new CPAP unit if needed.  We reviewed risks of untreated sleep apnea and treatment options.  Encourage weight loss and side sleeping position.  Advised against driving if experiencing excessive daytime sleepiness fatigue.  Needed follow-up 31 to 90 days after receiving new CPAP unit for compliance check.

## 2022-09-29 NOTE — Progress Notes (Signed)
@Patient  ID: Stacy [name], female    DOB: 1960-05-25, 62 y.o.   MRN: [id number]  Chief Complaint  Patient presents with   Consult    Epworth 18     Referring provider: Kerri Perches, MD  HPI: 61 year old female, never smoked.  Past medical history significant for OSA, GERD, esophageal dysphagia, hypothyroidism, multinodular goiter, next hyperlipidemia, metabolic syndrome, prediabetes, vitamin D deficiency, obesity.   09/29/2022 Patient presents today for sleep consult.  History of sleep apnea. She had NSPG on 03/17/2013 that shoed moderate OSA, AHI 19 with SpO2 low 84%. CPAP machine is old and not consistently wearing.  Reports benefit from CPAP therapy. She has snoring symptoms and excessive daytime sleepiness. Typical bedtime is between 11-11:45 PM.  It does not take her long to fall asleep.  She usually wakes up once to use the restroom.  She starts her day at 5:30 AM.  Epworth score 18.  No concern for narcolepsy, cataplexy or sleepwalking.  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  Lives with her daughter.  Works as a Education officer, community.  Allergies  Allergen Reactions   Lactose Intolerance (Gi)     Vomiting, diarrhea, bloating    Immunization History  Administered Date(s) Administered   Influenza Split 02/10/2014   Influenza,inj,Quad PF,6+ Mos 01/30/2013, 01/12/2015, 01/30/2017, 04/22/2018, 01/29/2019, 03/11/2021, 01/02/2022   Moderna Sars-Covid-2 Vaccination 07/09/2019, 07/29/2019, 03/20/2020   Tdap 05/09/2012, 09/01/2022   Zoster Recombinat (Shingrix)  05/21/2018, 03/11/2021    Past Medical History:  Diagnosis Date   Abnormal uterine bleeding    Chronic leukopenia 08/17/2016   WBC = 3.5   DDD (degenerative disc disease), cervical    Dyslipidemia    Elevated serum creatinine 08/17/2016   GERD (gastroesophageal reflux disease)    History of esophageal dilatation    2013   History of ovarian cyst    Hypothyroidism    Lactose intolerance    OSA on CPAP    study done 04-08-2013   Pre-diabetes    Right thyroid nodule    SUI (stress urinary incontinence, female)    Tonsillar exudate 08/13/2014   Wears glasses     Tobacco History: Social History   Tobacco Use  Smoking Status Never  Smokeless Tobacco Never   Counseling given: Not Answered   Outpatient Medications Prior to Visit  Medication Sig Dispense Refill   Cholecalciferol (VITAMIN D) 10 MCG/ML LIQD Vitamin D     Fish Oil-Cholecalciferol (FISH OIL + D3) 1200-1000 MG-UNIT CAPS Take by mouth.     levothyroxine (SYNTHROID) 150 MCG tablet TAKE 1 TABLET(150 MCG) BY MOUTH DAILY BEFORE AND BREAKFAST 90 tablet 3   Multiple Vitamin (MULTI VITAMIN) TABS 1 tablet Orally Once a day for 30 day(s)     omeprazole (PRILOSEC) 40 MG capsule Take 1 capsule (40 mg total) by mouth daily. 30 capsule 2   No facility-administered medications prior to visit.    Review of Systems  Review of Systems  Constitutional:  Positive for fatigue.  HENT: Negative.    Respiratory: Negative.    Cardiovascular: Negative.   Psychiatric/Behavioral:  Positive for sleep disturbance.      Physical Exam  BP 122/82 (BP Location: Left Arm, Patient Position: Sitting, Cuff Size: Normal)   Pulse 61   Temp (!) 97.4 F (36.3 C) (Temporal)   Ht 5\' 7"  (1.702 m)   Wt 192 lb (87.1 kg)   LMP 09/20/2016 (Approximate)   SpO2 98%   BMI 30.07 kg/m  Physical Exam Constitutional:      Appearance: Normal appearance.  HENT:     Head: Normocephalic and atraumatic.     Mouth/Throat:     Mouth: Mucous membranes  are moist.     Pharynx: Oropharynx is clear.  Cardiovascular:     Rate and Rhythm: Normal rate and regular rhythm.  Pulmonary:     Effort: Pulmonary effort is normal.     Breath sounds: Normal breath sounds.  Neurological:     General: No focal deficit present.     Mental Status: She is alert and oriented to person, place, and time. Mental status is at baseline.  Psychiatric:        Mood and Affect: Mood normal.        Behavior: Behavior normal.        Thought Content: Thought content normal.        Judgment: Judgment normal.      Lab Results:  CBC    Component Value Date/Time   WBC 4.0 09/25/2022 1302   WBC 3.9 05/29/2019 1412   RBC 4.07 09/25/2022 1302   RBC 4.04 05/29/2019 1412   HGB 11.8 09/25/2022 1302   HCT 35.0 09/25/2022 1302   PLT 176 09/25/2022 1302   MCV 86 09/25/2022 1302   MCH 29.0 09/25/2022 1302   MCH 29.7 05/29/2019 1412   MCHC 33.7 09/25/2022 1302   MCHC 34.1 05/29/2019 1412   RDW 12.8 09/25/2022 1302   LYMPHSABS 1.4 06/15/2020 0902   MONOABS 0.4 08/01/2013 0921   EOSABS 0.0 06/15/2020 0902   BASOSABS 0.0 06/15/2020 0902    BMET    Component Value Date/Time   NA 141 06/15/2020 0902   K 4.1 06/15/2020 0902   CL 103 06/15/2020 0902   CO2 24 06/15/2020 0902   GLUCOSE 87 06/15/2020 0902   GLUCOSE 80 05/29/2019 1412   BUN 13 06/15/2020 0902   CREATININE 0.97 06/15/2020 0902   CREATININE 1.04 05/29/2019 1412   CALCIUM 9.4 06/15/2020 0902   GFRNONAA 64 06/15/2020 0902   GFRNONAA 59 (L) 05/29/2019 1412   GFRAA 73 06/15/2020 0902   GFRAA 68 05/29/2019 1412    BNP No results found for: "BNP"  ProBNP No results found for: "PROBNP"  Imaging: MM 3D SCREENING MAMMOGRAM BILATERAL BREAST  Result Date: 09/26/2022 CLINICAL DATA:  Screening. EXAM: DIGITAL SCREENING BILATERAL MAMMOGRAM WITH TOMOSYNTHESIS AND CAD TECHNIQUE: Bilateral screening digital craniocaudal and mediolateral oblique mammograms were obtained. Bilateral screening digital breast  tomosynthesis was performed. The images were evaluated with computer-aided detection. COMPARISON:  Previous exam(s). ACR Breast Density Category b: There are scattered areas of fibroglandular density. FINDINGS: There are no findings suspicious for malignancy. IMPRESSION: No mammographic evidence of malignancy. A result letter of this screening mammogram will be mailed directly to the patient. RECOMMENDATION: Screening mammogram in one year. (Code:SM-B-01Y) BI-RADS CATEGORY  1: Negative. Electronically Signed   By: Elberta Fortis M.D.   On: 09/26/2022 16:33     Assessment & Plan:   OSA (obstructive sleep apnea) - Hx moderate sleep apnea, AHI 19/hour. Original sleep study was 10 years ago. Current CPAP machine is out of date, not wearing consistently but reports benefit from  use. Needs home sleep study to re-evaluate OSA prior to placing an order for her to receive new CPAP unit if needed.  We reviewed risks of untreated sleep apnea and treatment options.  Encourage weight loss and side sleeping position.  Advised against driving if experiencing excessive daytime sleepiness fatigue.  Needed follow-up 31 to 90 days after receiving new CPAP unit for compliance check.     Glenford Bayley, NP 09/29/2022

## 2022-10-02 NOTE — Progress Notes (Signed)
Reviewed and agree with assessment/plan.   Coralyn Helling, MD [facility]/Critical [facility] 10/02/2022, 7:08 AM Pager:  [phone]

## 2022-10-06 ENCOUNTER — Ambulatory Visit (HOSPITAL_COMMUNITY): Payer: PRIVATE HEALTH INSURANCE

## 2022-10-16 ENCOUNTER — Encounter (HOSPITAL_COMMUNITY): Payer: Self-pay

## 2022-10-16 ENCOUNTER — Ambulatory Visit (HOSPITAL_COMMUNITY)
Admission: RE | Admit: 2022-10-16 | Discharge: 2022-10-16 | Disposition: A | Discharge status: Home / Self Care | Payer: PRIVATE HEALTH INSURANCE | Source: Ambulatory Visit | Attending: Family Medicine | Admitting: Family Medicine

## 2022-10-16 DIAGNOSIS — R1011 Right upper quadrant pain: Secondary | ICD-10-CM

## 2022-10-16 DIAGNOSIS — R140 Abdominal distension (gaseous): Secondary | ICD-10-CM

## 2022-10-17 ENCOUNTER — Encounter: Payer: Self-pay | Admitting: "Endocrinology

## 2022-10-17 ENCOUNTER — Ambulatory Visit (INDEPENDENT_AMBULATORY_CARE_PROVIDER_SITE_OTHER): Payer: PRIVATE HEALTH INSURANCE | Admitting: "Endocrinology

## 2022-10-17 VITALS — BP 124/66 | HR 68 | Ht 67.0 in | Wt 189.6 lb

## 2022-10-17 DIAGNOSIS — E782 Mixed hyperlipidemia: Secondary | ICD-10-CM | POA: Diagnosis not present

## 2022-10-17 DIAGNOSIS — R7303 Prediabetes: Secondary | ICD-10-CM

## 2022-10-17 DIAGNOSIS — E039 Hypothyroidism, unspecified: Secondary | ICD-10-CM

## 2022-10-17 DIAGNOSIS — E042 Nontoxic multinodular goiter: Secondary | ICD-10-CM

## 2022-10-17 LAB — POCT GLYCOSYLATED HEMOGLOBIN (HGB A1C): HbA1c, POC (controlled diabetic range): 5.9 % (ref 0.0–7.0)

## 2022-10-17 MED ORDER — LEVOTHYROXINE SODIUM 137 MCG PO TABS: 137.0000 ug | ORAL_TABLET | Freq: Every day | ORAL | 1 refills | Status: DC

## 2022-10-17 NOTE — Progress Notes (Signed)
10/17/2022      Endocrinology follow-up note   Subjective:    Patient ID: Stacy [name], female    DOB: [birth year],  PCP Kerri Perches, MD   Past Medical History:  Diagnosis Date   Abnormal uterine bleeding    Chronic leukopenia 08/17/2016   WBC = 3.5   DDD (degenerative disc disease), cervical    Dyslipidemia    Elevated serum creatinine 08/17/2016   GERD (gastroesophageal reflux disease)    History of esophageal dilatation    2013   History of ovarian cyst    Hypothyroidism    Lactose intolerance    OSA on CPAP    study done 04-08-2013   Pre-diabetes    Right thyroid nodule    SUI (stress urinary incontinence, female)    Tonsillar exudate 08/13/2014   Wears glasses    Past Surgical History:  Procedure Laterality Date   CESAREAN SECTION  1997   Twins-1 twin del.by c/s, 1 del.vaginally   COLONOSCOPY  04/04/2011   Internal hemorrhoids/Nl colonoscopy-SLIGHTLY TORTUOUS COLON   DILATATION & CURETTAGE/HYSTEROSCOPY WITH MYOSURE N/A 08/24/2016   Procedure: DILATATION & CURETTAGE/HYSTEROSCOPY WITH MYOSURE Possible Myosure ;  Surgeon: Patton Salles, MD;  Location: [facility];  Service: Gynecology;  Laterality: N/A;   DILATION AND CURETTAGE OF UTERUS  1998   Seconday to miscarriage   ESOPHAGOGASTRODUODENOSCOPY  09/22/2011   Mild gastritis/Hiatal hernia/DYSPHAGIA DUE TO PEPTIC STRICTURE/UNCONTROLLED GERD   KNEE ARTHROSCOPY Right 2003   MALONEY DILATION  09/22/2011   Procedure: MALONEY DILATION;  Surgeon: West Bali, MD;  Location: [facility];  Service: Endoscopy;  Laterality: N/A;   REPLACEMENT TOTAL KNEE Right 2022   SAVORY DILATION  09/22/2011   Procedure: SAVORY DILATION;  Surgeon: West Bali, MD;  Location: [facility];  Service: Endoscopy;  Laterality: N/A;   Social History   Socioeconomic History   Marital status: Married    Spouse name: Not on file   Number of children: Not on file   Years of education: Not on file   Highest  education level: Not on file  Occupational History   Occupation: Full time: [employer]  Tobacco Use   Smoking status: Never   Smokeless tobacco: Never  Vaping Use   Vaping Use: Never used  Substance and Sexual Activity   Alcohol use: No   Drug use: No   Sexual activity: Yes    Partners: Male    Birth control/protection: Other-see comments    Comment: husband with vasectomy  Other Topics Concern   Not on file  Social History Narrative   Married to Dr. Gerilyn Pilgrim, neurology   Regular exercise: No   Social Determinants of Health   Financial Resource Strain: Not on file  Food Insecurity: Not on file  Transportation Needs: Not on file  Physical Activity: Not on file  Stress: Not on file  Social Connections: Not on file   Outpatient Encounter Medications as of 10/17/2022  Medication Sig   Cholecalciferol (VITAMIN D) 10 MCG/ML LIQD Vitamin D   Fish Oil-Cholecalciferol (FISH OIL + D3) 1200-1000 MG-UNIT CAPS Take by mouth.   levothyroxine (SYNTHROID) 137 MCG tablet Take 1 tablet (137 mcg total) by mouth daily before breakfast.   Multiple Vitamin (MULTI VITAMIN) TABS 1 tablet Orally Once a day for 30 day(s)   omeprazole (PRILOSEC) 40 MG capsule Take 1 capsule (40 mg total) by mouth daily.   [DISCONTINUED] levothyroxine (SYNTHROID) 150 MCG tablet TAKE 1 TABLET(150 MCG) BY MOUTH DAILY  BEFORE AND BREAKFAST   No facility-administered encounter medications on file as of 10/17/2022.   ALLERGIES: Allergies  Allergen Reactions   Lactose Intolerance (Gi)     Vomiting, diarrhea, bloating   VACCINATION STATUS: Immunization History  Administered Date(s) Administered   Influenza Split 02/10/2014   Influenza,inj,Quad PF,6+ Mos 01/30/2013, 01/12/2015, 01/30/2017, 04/22/2018, 01/29/2019, 03/11/2021, 01/02/2022   Moderna Sars-Covid-2 Vaccination 07/09/2019, 07/29/2019, 03/20/2020   Tdap 05/09/2012, 09/01/2022   Zoster Recombinat (Shingrix) 05/21/2018, 03/11/2021    HPI  62 yr old female with  hypothyroidism from Hashimoto's thyroiditis. She is currently on levothyroxine 150 mcg p.o. daily before breakfast.    She reports better consistency taking her medication.  She has no new complaints today.  She reports progressive weight gain, prediabetes with A1c of 5.9% improving from 6.5% today.  She also has hyperlipidemia, on Crestor.    -  She denies tremors, heat intolerance.  Her previsit thyroid function tests are consistent with slight over-replacement.     she denies any family hx of thyroid dysfunction. She has a stable  1.0 cm right thyroid lobe nodule which has remained stable for 7 years.   -She is a mother of  4 grown children, with hx of HEELP syndrome during the first pregnancy. -She did not tolerate metformin which was prescribed due to prediabetes. -She is on ongoing vitamin D supplement.       Review of Systems Limited as above.  Objective:    BP 124/66   Pulse 68   Ht 5\' 7"  (1.702 m)   Wt 189 lb 9.6 oz (86 kg)   LMP 09/20/2016 (Approximate)   BMI 29.70 kg/m   Wt Readings from Last 3 Encounters:  10/17/22 189 lb 9.6 oz (86 kg)  09/29/22 192 lb (87.1 kg)  09/01/22 193 lb 1.3 oz (87.6 kg)      Results for orders placed or performed in visit on 10/17/22  HgB A1c  Result Value Ref Range   Hemoglobin A1C     HbA1c POC (<> result, manual entry)     HbA1c, POC (prediabetic range)     HbA1c, POC (controlled diabetic range) 5.9 0.0 - 7.0 %   Chemistry (most recent): Lab Results  Component Value Date   NA 141 06/15/2020   K 4.1 06/15/2020   CL 103 06/15/2020   CO2 24 06/15/2020   BUN 13 06/15/2020   CREATININE 0.97 06/15/2020   Diabetic Labs (most recent): Lab Results  Component Value Date   HGBA1C 5.9 10/17/2022   HGBA1C 6.5 (H) 09/25/2022   HGBA1C 6.2 (H) 06/29/2021   Lipid Panel     Component Value Date/Time   CHOL 183 09/25/2022 1302   TRIG 158 (H) 09/25/2022 1302   HDL 46 09/25/2022 1302   CHOLHDL 4.0 09/25/2022 1302   CHOLHDL 4.4  05/29/2019 1412   VLDL 19 03/09/2015 1509   LDLCALC 109 (H) 09/25/2022 1302   LDLCALC 124 (H) 05/29/2019 1412     Assessment & Plan:   1) hypothyroidism from Hashimoto's thyroiditis:   - Her thyroid function test are consistent with slight over-replacement.  I discussed and lowered her levothyroxine to 137 mcg p.o. daily before breakfast.     - We discussed about the correct intake of her thyroid hormone, on empty stomach at fasting, with water, separated by at least 30 minutes from breakfast and other medications,  and separated by more than 4 hours from calcium, iron, multivitamins, acid reflux medications (PPIs). -Patient is made aware of the  fact that thyroid hormone replacement is needed for life, dose to be adjusted by periodic monitoring of thyroid function tests.    2) multinodular goiter  -Her previsit thyroid ultrasound shows stable 1 cm nodule on the right lobe, stable finding for 7 years.  She will not need any further thyroid imaging.      3) prediabetes:  -Her point-of-care A1c today is 5.9% improving from 6.5%.  She does not tolerate metformin.  Given the fact that she has metabolic syndrome with prediabetes, hyperlipidemia, she was offered lifestyle  medicine package and she is engaging progressively.    - she acknowledges that there is a room for improvement in her food and drink choices. - Suggestion is made for her to avoid simple carbohydrates  from her diet including Cakes, Sweet Desserts, Ice Cream, Soda (diet and regular), Sweet Tea, Candies, Chips, Cookies, Store Bought Juices, Alcohol , Artificial Sweeteners,  Coffee Creamer, and "Sugar-free" Products, Lemonade. This will help patient to have more stable blood glucose profile and potentially avoid unintended weight gain.  The following Lifestyle Medicine recommendations according to American College of Lifestyle Medicine  [facility]) were discussed and and offered to patient and she  agrees to start the journey:   A. Whole Foods, Plant-Based Nutrition comprising of fruits and vegetables, plant-based proteins, whole-grain carbohydrates was discussed in detail with the patient.   A list for source of those nutrients were also provided to the patient.  Patient will use only water or unsweetened tea for hydration. B.  The need to stay away from risky substances including alcohol, smoking; obtaining 7 to 9 hours of restorative sleep, at least 150 minutes of moderate intensity exercise weekly, the importance of healthy social connections,  and stress management techniques were discussed. C.  A full color page of  Calorie density of various food groups per pound showing examples of each food groups was provided to the patient.    4) mixed hyperlipidemia -She has been hesitant to take her Crestor.  Her LDL is 109 progressively improving from 132.    The above detailed WF PB diet will help with dyslipidemia as well.    5) vitamin D deficiency: She is currently on vitamin D replacement at 5000 units daily.  She is advised to maintain close follow-up with her PMD Dr. Syliva Overman.   I spent  26  minutes in the care of the patient today including review of labs from Thyroid Function, CMP, and other relevant labs ; imaging/biopsy records (current and previous including abstractions from other facilities); face-to-face time discussing  her lab results and symptoms, medications doses, her options of short and long term treatment based on the latest standards of care / guidelines;   and documenting the encounter.  Stacy [name]  participated in the discussions, expressed understanding, and voiced agreement with the above plans.  All questions were answered to her satisfaction. she is encouraged to contact clinic should she have any questions or concerns prior to her return visit.   Follow up plan: Return in about 6 months (around 04/18/2023) for Fasting Labs  in AM B4 8.  Marquis Lunch, MD Phone:  [phone]  Fax: [phone]  -  This note was partially dictated with voice recognition software. Similar sounding words can be transcribed inadequately or may not  be corrected upon review.  10/17/2022, 9:54 AM

## 2022-10-17 NOTE — Patient Instructions (Signed)

## 2022-11-03 ENCOUNTER — Ambulatory Visit: Payer: Self-pay | Admitting: "Endocrinology

## 2022-12-08 ENCOUNTER — Ambulatory Visit: Payer: PRIVATE HEALTH INSURANCE | Admitting: Family Medicine

## 2022-12-22 ENCOUNTER — Telehealth: Payer: Self-pay | Admitting: Family Medicine

## 2022-12-22 ENCOUNTER — Encounter: Payer: Self-pay | Admitting: Family Medicine

## 2022-12-22 NOTE — Telephone Encounter (Signed)
Patient called in wants orders for ultrasound faxed over [facility]   Fax # [phone]

## 2022-12-22 NOTE — Telephone Encounter (Signed)
This patient wants to be scheduled at [facility] radiology instead. Thanks

## 2023-01-01 ENCOUNTER — Ambulatory Visit (HOSPITAL_COMMUNITY): Payer: PRIVATE HEALTH INSURANCE

## 2023-01-08 ENCOUNTER — Other Ambulatory Visit: Payer: PRIVATE HEALTH INSURANCE

## 2023-01-10 ENCOUNTER — Ambulatory Visit
Admission: RE | Admit: 2023-01-10 | Discharge: 2023-01-10 | Disposition: A | Discharge status: Home / Self Care | Payer: PRIVATE HEALTH INSURANCE | Source: Ambulatory Visit | Attending: Family Medicine | Admitting: Family Medicine

## 2023-01-10 DIAGNOSIS — R1011 Right upper quadrant pain: Secondary | ICD-10-CM

## 2023-01-10 DIAGNOSIS — R140 Abdominal distension (gaseous): Secondary | ICD-10-CM

## 2023-04-23 ENCOUNTER — Ambulatory Visit: Payer: PRIVATE HEALTH INSURANCE | Admitting: "Endocrinology

## 2023-08-07 ENCOUNTER — Ambulatory Visit: Payer: Self-pay

## 2023-08-07 ENCOUNTER — Ambulatory Visit (INDEPENDENT_AMBULATORY_CARE_PROVIDER_SITE_OTHER)

## 2023-08-07 ENCOUNTER — Ambulatory Visit
Admission: EM | Admit: 2023-08-07 | Discharge: 2023-08-07 | Disposition: A | Discharge status: Home / Self Care | Attending: Nurse Practitioner | Admitting: Nurse Practitioner

## 2023-08-07 DIAGNOSIS — M19022 Primary osteoarthritis, left elbow: Secondary | ICD-10-CM | POA: Diagnosis not present

## 2023-08-07 DIAGNOSIS — S52135A Nondisplaced fracture of neck of left radius, initial encounter for closed fracture: Secondary | ICD-10-CM | POA: Diagnosis not present

## 2023-08-07 DIAGNOSIS — S59902A Unspecified injury of left elbow, initial encounter: Secondary | ICD-10-CM | POA: Diagnosis not present

## 2023-08-07 NOTE — ED Triage Notes (Addendum)
 Pt reports while talking on the phone and doing multiple things at once while on the ladder she missed a step while stepping down and fell 46ft on her left elbow this morning.  Pt cannot bend left elbow without guarding it.

## 2023-08-07 NOTE — Discharge Instructions (Signed)
 As we discussed, your left elbow bone appears to be broken.  We have placed you in a splint and given you a sling to wear to help provide support.  You can can use ibuprofen or Tylenol as needed for pain and apply ice over the splint to help with pain and swelling.  Recommend close follow-up with Ortho; contact formation has been provided.

## 2023-08-07 NOTE — ED Provider Notes (Signed)
 [facility]    CSN: [id number] Arrival date & time: 08/07/23  1239      History   Chief Complaint Chief Complaint  Patient presents with   Elbow Injury    HPI Stacy [name] is a 63 y.o. female.   Patient presents today with left elbow pain after she fell while on a step ladder earlier today.  States she was coming down the ladder when she missed the last step and thinks she fell back directly onto her elbow.  She is now having pain in her elbow, worse with movement and internal/external rotation.  No numbness or tingling/skin color changes in the left upper extremity.  She took ibuprofen for the pain which did seem to help.  Denies history of elbow surgeries.    Past Medical History:  Diagnosis Date   Abnormal uterine bleeding    Chronic leukopenia 08/17/2016   WBC = 3.5   DDD (degenerative disc disease), cervical    Dyslipidemia    Elevated serum creatinine 08/17/2016   GERD (gastroesophageal reflux disease)    History of esophageal dilatation    2013   History of ovarian cyst    Hypothyroidism    Lactose intolerance    OSA on CPAP    study done 04-08-2013   Pre-diabetes    Right thyroid nodule    SUI (stress urinary incontinence, female)    Tonsillar exudate 08/13/2014   Wears glasses     Patient Active Problem List   Diagnosis Date Noted   OSA (obstructive sleep apnea) 09/04/2022   Annual visit for general adult medical examination with abnormal findings 09/04/2022   Esophageal dysphagia 09/04/2022   RUQ pain 09/04/2022   Calcific tendinitis of left shoulder 01/02/2022   Pain of left calf 07/01/2021   Arthrofibrosis of knee joint, right 12/26/2020   Mixed hyperlipidemia 12/18/2019   Sacroiliitis (HCC) 05/29/2019   Elevated serum creatinine 08/26/2018   Dyspepsia 06/03/2018   Vitamin D deficiency 04/05/2017   Primary hypothyroidism 03/11/2015   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   Polymenorrhea 08/13/2014   Pain in joint, ankle  and foot 08/13/2014   Metabolic syndrome X 08/24/2013   Sleep disorder 02/01/2013   Prediabetes 01/30/2013   OA (osteoarthritis) of knee 10/03/2012   Cervical back pain with evidence of disc disease 09/24/2012   Hypothyroidism 09/21/2011   ABNORMAL ELECTROCARDIOGRAM 03/04/2010   Anemia 09/15/2009   Leukocytopenia 09/15/2009   Obesity (BMI 30.0-34.9) 04/24/2009   Dyslipidemia 05/23/2007   GERD 05/23/2007    Past Surgical History:  Procedure Laterality Date   CESAREAN SECTION  1997   Twins-1 twin del.by c/s, 1 del.vaginally   COLONOSCOPY  04/04/2011   Internal hemorrhoids/Nl colonoscopy-SLIGHTLY TORTUOUS COLON   DILATATION & CURETTAGE/HYSTEROSCOPY WITH MYOSURE N/A 08/24/2016   Procedure: DILATATION & CURETTAGE/HYSTEROSCOPY WITH MYOSURE Possible Myosure ;  Surgeon: Greta Leatherwood, MD;  Location: [facility];  Service: Gynecology;  Laterality: N/A;   DILATION AND CURETTAGE OF UTERUS  1998   Seconday to miscarriage   ESOPHAGOGASTRODUODENOSCOPY  09/22/2011   Mild gastritis/Hiatal hernia/DYSPHAGIA DUE TO PEPTIC STRICTURE/UNCONTROLLED GERD   KNEE ARTHROSCOPY Right 2003   MALONEY DILATION  09/22/2011   Procedure: MALONEY DILATION;  Surgeon: Alyce Jubilee, MD;  Location: [facility];  Service: Endoscopy;  Laterality: N/A;   REPLACEMENT TOTAL KNEE Right 2022   SAVORY DILATION  09/22/2011   Procedure: SAVORY DILATION;  Surgeon: Alyce Jubilee, MD;  Location: [facility];  Service: Endoscopy;  Laterality: N/A;  OB History     Gravida  4   Para  3   Term  3   Preterm      AB  1   Living  4      SAB  1   IAB      Ectopic      Multiple  1   Live Births  4            Home Medications    Prior to Admission medications   Medication Sig Start Date End Date Taking? Authorizing Provider  Cholecalciferol (VITAMIN D) 10 MCG/ML LIQD Vitamin D 09/29/22   [provider]  Fish Oil-Cholecalciferol (FISH OIL + D3) 1200-1000 MG-UNIT CAPS Take by mouth.     [provider]  levothyroxine (SYNTHROID) 137 MCG tablet Take 1 tablet (137 mcg total) by mouth daily before breakfast. 10/17/22   Nida, Gebreselassie W, MD  Multiple Vitamin (MULTI VITAMIN) TABS 1 tablet Orally Once a day for 30 day(s) 09/29/22   [provider]  omeprazole (PRILOSEC) 40 MG capsule Take 1 capsule (40 mg total) by mouth daily. 09/01/22   Towanda Fret, MD    Family History Family History  Problem Relation Age of Onset   Hypertension Mother    Angina Mother        MVP. h/o intestinal polyps possibly   Hyperlipidemia Mother    Transient ischemic attack Mother    Hypertension Father    Hyperlipidemia Father        glucose intolerant   Diabetes Father    GER disease Brother    Diabetes Brother    Diabetes Maternal Grandfather    Hyperlipidemia Paternal Grandmother    Thyroid disease Paternal Grandmother    Hyperlipidemia Paternal Grandfather    Thyroid disease Brother        hyperthyroid   Colon cancer Neg Hx    Breast cancer Neg Hx    Ovarian cancer Neg Hx    Anesthesia problems Neg Hx     Social History Social History   Tobacco Use   Smoking status: Never   Smokeless tobacco: Never  Vaping Use   Vaping status: Never Used  Substance Use Topics   Alcohol use: No   Drug use: No     Allergies   Lactose intolerance (gi)   Review of Systems Review of Systems Per HPI  Physical Exam Triage Vital Signs ED Triage Vitals [08/07/23 1248]  Encounter Vitals Group     BP 129/68     Systolic BP Percentile      Diastolic BP Percentile      Pulse Rate 74     Resp 20     Temp      Temp Source Oral     SpO2 98 %     Weight      Height      Head Circumference      Peak Flow      Pain Score 5     Pain Loc      Pain Education      Exclude from Growth Chart    No data found.  Updated Vital Signs BP 129/68 (BP Location: Right Arm)   Pulse 74   Resp 20   LMP 09/20/2016 (Approximate)   SpO2 98%   Visual Acuity Right Eye  Distance:   Left Eye Distance:   Bilateral Distance:    Right Eye Near:   Left Eye Near:    Bilateral Near:  Physical Exam Vitals and nursing note reviewed.  Constitutional:      General: She is not in acute distress.    Appearance: Normal appearance. She is not toxic-appearing.  HENT:     Mouth/Throat:     Mouth: Mucous membranes are moist.     Pharynx: Oropharynx is clear.  Pulmonary:     Effort: Pulmonary effort is normal. No respiratory distress.  Musculoskeletal:     Comments: Inspection: no swelling, bruising, obvious deformity or redness to left elbow Palpation: tender to palpation left elbow near radial head; no obvious deformities palpated ROM: Full ROM to left elbow; pain with internal/external rotation Strength: 5/5 bilateral upper extremities Neurovascular: neurovascularly intact in distal bilateral upper extremities   Skin:    General: Skin is warm and dry.     Capillary Refill: Capillary refill takes less than 2 seconds.     Coloration: Skin is not jaundiced or pale.     Findings: No erythema.  Neurological:     Mental Status: She is alert and oriented to person, place, and time.  Psychiatric:        Behavior: Behavior is cooperative.      UC Treatments / Results  Labs (all labs ordered are listed, but only abnormal results are displayed) Labs Reviewed - No data to display  EKG   Radiology DG ELBOW COMPLETE LEFT (3+VIEW) Result Date: 08/07/2023 CLINICAL DATA:  fell off of  ladder. EXAM: LEFT ELBOW - COMPLETE 3+ VIEW COMPARISON:  None Available. FINDINGS: There is subtle incomplete lucency in the region of the radial neck, which most likely represents degenerative osteophyte formation. However, correlate clinically for acute impacted fracture. No other acute fracture or dislocation. No aggressive osseous lesion. Mild degenerative arthritis of elbow joint. No radiopaque foreign bodies. Soft tissues are within normal limits. IMPRESSION: *There is subtle  incomplete lucency in the region of the radial neck, which most likely represents degenerative osteophyte formation. However, correlate clinically for acute impacted fracture. Electronically Signed   By: Beula Brunswick M.D.   On: 08/07/2023 13:05    Procedures Procedures (including critical care time)  Medications Ordered in UC Medications - No data to display  Initial Impression / Assessment and Plan / UC Course  I have reviewed the triage vital signs and the nursing notes.  Pertinent labs & imaging results that were available during my care of the patient were reviewed by me and considered in my medical decision making (see chart for details).   Patient is well-appearing, normotensive, not tachycardic, not tachypneic, oxygenating well on room air.    1. Closed nondisplaced fracture of neck of left radius, initial encounter Vitals are reassuring today in triage; no red flags Patient has tenderness to the proximal radius in the area of concern on radiographic imaging; highly suspicious for acute radial neck fracture Patient was placed in a posterior long-arm splint and was given a sling and I recommended close follow-up with orthopedic provider; contact information was provided today Supportive care discussed including Tylenol/ibuprofen for pain, elevation, ice as needed Work excuse provided  The patient was given the opportunity to ask questions.  All questions answered to their satisfaction.  The patient is in agreement to this plan.   Final Clinical Impressions(s) / UC Diagnoses   Final diagnoses:  Closed nondisplaced fracture of neck of left radius, initial encounter     Discharge Instructions      As we discussed, your left elbow bone appears to be broken.  We have placed  you in a splint and given you a sling to wear to help provide support.  You can can use ibuprofen or Tylenol as needed for pain and apply ice over the splint to help with pain and swelling.  Recommend close  follow-up with Ortho; contact formation has been provided.    ED Prescriptions   None    PDMP not reviewed this encounter.   Wilhemena Harbour, NP 08/07/23 1600

## 2023-08-08 ENCOUNTER — Telehealth: Payer: Self-pay | Admitting: "Endocrinology

## 2023-08-08 ENCOUNTER — Encounter: Payer: Self-pay | Admitting: Orthopaedic Surgery

## 2023-08-08 ENCOUNTER — Ambulatory Visit (INDEPENDENT_AMBULATORY_CARE_PROVIDER_SITE_OTHER): Admitting: Orthopaedic Surgery

## 2023-08-08 ENCOUNTER — Other Ambulatory Visit: Payer: Self-pay | Admitting: *Deleted

## 2023-08-08 ENCOUNTER — Other Ambulatory Visit: Payer: Self-pay | Admitting: "Endocrinology

## 2023-08-08 DIAGNOSIS — E039 Hypothyroidism, unspecified: Secondary | ICD-10-CM

## 2023-08-08 DIAGNOSIS — R7303 Prediabetes: Secondary | ICD-10-CM

## 2023-08-08 DIAGNOSIS — S52125A Nondisplaced fracture of head of left radius, initial encounter for closed fracture: Secondary | ICD-10-CM | POA: Diagnosis not present

## 2023-08-08 DIAGNOSIS — E042 Nontoxic multinodular goiter: Secondary | ICD-10-CM

## 2023-08-08 DIAGNOSIS — E782 Mixed hyperlipidemia: Secondary | ICD-10-CM

## 2023-08-08 MED ORDER — IBUPROFEN 800 MG PO TABS: 800.0000 mg | ORAL_TABLET | Freq: Three times a day (TID) | ORAL | 5 refills | Status: DC | PRN

## 2023-08-08 NOTE — Telephone Encounter (Signed)
Labs have been updated . 

## 2023-08-08 NOTE — Progress Notes (Signed)
 Subjective:    Patient ID: Stacy [name], female    DOB: [birth year], 63 y.o.   MRN: [id number]  HPI  She fell off ladder yesterday getting supplies.  She hurt her left elbow.  She was seen at urgent care. X-rays show subtle nondisplaced radial head fracture.  She has no other injury.  She was put in posterior splint.  I have independently reviewed and interpreted x-rays of this patient done at another site by another physician or qualified health professional.  I have reviewed the notes from Urgent care.  Review of Systems  Constitutional:  Positive for activity change.  Musculoskeletal:  Positive for arthralgias and joint swelling.  All other systems reviewed and are negative. For Review of Systems, all other systems reviewed and are negative.  The following is a summary of the past history medically, past history surgically, known current medicines, social history and family history.  This information is gathered electronically by the computer from prior information and documentation.  I review this each visit and have found including this information at this point in the chart is beneficial and informative.   Past Medical History:  Diagnosis Date   Abnormal uterine bleeding    Chronic leukopenia 08/17/2016   WBC = 3.5   DDD (degenerative disc disease), cervical    Dyslipidemia    Elevated serum creatinine 08/17/2016   GERD (gastroesophageal reflux disease)    History of esophageal dilatation    2013   History of ovarian cyst    Hypothyroidism    Lactose intolerance    OSA on CPAP    study done 04-08-2013   Pre-diabetes    Right thyroid nodule    SUI (stress urinary incontinence, female)    Tonsillar exudate 08/13/2014   Wears glasses     Past Surgical History:  Procedure Laterality Date   CESAREAN SECTION  1997   Twins-1 twin del.by c/s, 1 del.vaginally   COLONOSCOPY  04/04/2011   Internal hemorrhoids/Nl colonoscopy-SLIGHTLY TORTUOUS COLON   DILATATION &  CURETTAGE/HYSTEROSCOPY WITH MYOSURE N/A 08/24/2016   Procedure: DILATATION & CURETTAGE/HYSTEROSCOPY WITH MYOSURE Possible Myosure ;  Surgeon: Patton Salles, MD;  Location: [facility];  Service: Gynecology;  Laterality: N/A;   DILATION AND CURETTAGE OF UTERUS  1998   Seconday to miscarriage   ESOPHAGOGASTRODUODENOSCOPY  09/22/2011   Mild gastritis/Hiatal hernia/DYSPHAGIA DUE TO PEPTIC STRICTURE/UNCONTROLLED GERD   KNEE ARTHROSCOPY Right 2003   MALONEY DILATION  09/22/2011   Procedure: MALONEY DILATION;  Surgeon: West Bali, MD;  Location: [facility];  Service: Endoscopy;  Laterality: N/A;   REPLACEMENT TOTAL KNEE Right 2022   SAVORY DILATION  09/22/2011   Procedure: SAVORY DILATION;  Surgeon: West Bali, MD;  Location: [facility];  Service: Endoscopy;  Laterality: N/A;    Current Outpatient Medications on File Prior to Visit  Medication Sig Dispense Refill   Cholecalciferol (VITAMIN D) 10 MCG/ML LIQD Vitamin D     Fish Oil-Cholecalciferol (FISH OIL + D3) 1200-1000 MG-UNIT CAPS Take by mouth.     levothyroxine (SYNTHROID) 137 MCG tablet Take 1 tablet (137 mcg total) by mouth daily before breakfast. 90 tablet 1   Multiple Vitamin (MULTI VITAMIN) TABS 1 tablet Orally Once a day for 30 day(s)     omeprazole (PRILOSEC) 40 MG capsule Take 1 capsule (40 mg total) by mouth daily. 30 capsule 2   No current facility-administered medications on file prior to visit.    Social History   Socioeconomic  History   Marital status: Married    Spouse name: Not on file   Number of children: Not on file   Years of education: Not on file   Highest education level: Not on file  Occupational History   Occupation: Full time: [employer]  Tobacco Use   Smoking status: Never   Smokeless tobacco: Never  Vaping Use   Vaping status: Never Used  Substance and Sexual Activity   Alcohol use: No   Drug use: No   Sexual activity: Yes    Partners: Male    Birth control/protection: Other-see  comments    Comment: husband with vasectomy  Other Topics Concern   Not on file  Social History Narrative   Married to Dr. Joleen Navy, neurology   Regular exercise: No   Social Drivers of Health   Financial Resource Strain: Not on file  Food Insecurity: Not on file  Transportation Needs: Not on file  Physical Activity: Not on file  Stress: Not on file  Social Connections: Unknown (08/29/2021)   Received from [facility]   Social Network    Social Network: Not on file  Intimate Partner Violence: Unknown (07/29/2021)   Received from [employer], [facility]   HITS    Physically Hurt: Not on file    Insult or Talk Down To: Not on file    Threaten Physical Harm: Not on file    Scream or Curse: Not on file    Family History  Problem Relation Age of Onset   Hypertension Mother    Angina Mother        MVP. h/o intestinal polyps possibly   Hyperlipidemia Mother    Transient ischemic attack Mother    Hypertension Father    Hyperlipidemia Father        glucose intolerant   Diabetes Father    GER disease Brother    Diabetes Brother    Diabetes Maternal Grandfather    Hyperlipidemia Paternal Grandmother    Thyroid disease Paternal Grandmother    Hyperlipidemia Paternal Grandfather    Thyroid disease Brother        hyperthyroid   Colon cancer Neg Hx    Breast cancer Neg Hx    Ovarian cancer Neg Hx    Anesthesia problems Neg Hx     LMP 09/20/2016 (Approximate)   There is no height or weight on file to calculate BMI.      Objective:   Physical Exam Vitals and nursing note reviewed. Exam conducted with a chaperone present.  Constitutional:      Appearance: She is well-developed.  HENT:     Head: Normocephalic and atraumatic.  Eyes:     Conjunctiva/sclera: Conjunctivae normal.     Pupils: Pupils are equal, round, and reactive to light.  Cardiovascular:     Rate and Rhythm: Normal rate and regular rhythm.  Pulmonary:     Effort: Pulmonary effort is  normal.  Abdominal:     Palpations: Abdomen is soft.  Musculoskeletal:       Arms:     Cervical back: Normal range of motion and neck supple.  Skin:    General: Skin is warm and dry.  Neurological:     Mental Status: She is alert and oriented to person, place, and time.     Cranial Nerves: No cranial nerve deficit.     Motor: No abnormal muscle tone.     Coordination: Coordination normal.     Deep Tendon Reflexes: Reflexes are normal  and symmetric. Reflexes normal.  Psychiatric:        Behavior: Behavior normal.        Thought Content: Thought content normal.        Judgment: Judgment normal.           Assessment & Plan:   Encounter Diagnosis  Name Primary?   Closed nondisplaced fracture of head of left radius, initial encounter Yes   I will call in Motrin 800.  Return in one week.  X-rays out of cast.  I have explained the fracture and have shown her the X-rays.  Call if any problem.  Precautions discussed.  Electronically Signed Pleasant Brilliant, MD 4/16/20259:01 AM

## 2023-08-08 NOTE — Telephone Encounter (Signed)
 Pt needs labs updated

## 2023-08-15 ENCOUNTER — Encounter: Payer: Self-pay | Admitting: Orthopaedic Surgery

## 2023-08-15 ENCOUNTER — Ambulatory Visit (INDEPENDENT_AMBULATORY_CARE_PROVIDER_SITE_OTHER): Admitting: Orthopaedic Surgery

## 2023-08-15 ENCOUNTER — Other Ambulatory Visit (INDEPENDENT_AMBULATORY_CARE_PROVIDER_SITE_OTHER): Payer: Self-pay

## 2023-08-15 DIAGNOSIS — S52125A Nondisplaced fracture of head of left radius, initial encounter for closed fracture: Secondary | ICD-10-CM | POA: Diagnosis not present

## 2023-08-15 DIAGNOSIS — S52125D Nondisplaced fracture of head of left radius, subsequent encounter for closed fracture with routine healing: Secondary | ICD-10-CM

## 2023-08-15 NOTE — Progress Notes (Signed)
 My elbow is a little sore  She was removed from the posterior splint on the left arm.  She has no new trauma.  NV intact.  ROM is good but pronation and supination are painful.    X-rays were done of the left elbow, reported separately.  Encounter Diagnosis  Name Primary?   Closed nondisplaced fracture of head of left radius with routine healing, subsequent encounter Yes   Continue the sling.    I will stop the splint.  ROM exercises discussed.    Return in one week.  No X-rays then unless new trauma or increased pain.  Call if any problem.  Precautions discussed.  Electronically Signed Pleasant Brilliant, MD 4/23/20258:49 AM

## 2023-08-22 ENCOUNTER — Other Ambulatory Visit (INDEPENDENT_AMBULATORY_CARE_PROVIDER_SITE_OTHER)

## 2023-08-22 ENCOUNTER — Ambulatory Visit (INDEPENDENT_AMBULATORY_CARE_PROVIDER_SITE_OTHER): Admitting: Orthopaedic Surgery

## 2023-08-22 ENCOUNTER — Encounter: Payer: Self-pay | Admitting: Orthopaedic Surgery

## 2023-08-22 DIAGNOSIS — S52125D Nondisplaced fracture of head of left radius, subsequent encounter for closed fracture with routine healing: Secondary | ICD-10-CM

## 2023-08-22 MED ORDER — CELECOXIB 200 MG PO CAPS: 200.0000 mg | ORAL_CAPSULE | Freq: Two times a day (BID) | ORAL | 3 refills | Status: AC

## 2023-08-22 NOTE — Addendum Note (Signed)
 Addended by: [place] Snow T on: 08/22/2023 10:33 AM   Modules accepted: Orders

## 2023-08-22 NOTE — Progress Notes (Signed)
 She has more pain in the left elbow but no trauma.  She feels she may have overdone it.  ROM is limited in pronation/supination and she lacks 10 degrees of full elbow extension.  NV intact.  Encounter Diagnosis  Name Primary?   Closed nondisplaced fracture of head of left radius with routine healing, subsequent encounter Yes   I will begin OT.  I have shown her some exercises to do.  I will begin Celebrex , stop the ibuprofen .  Return in three weeks.  Call if any problem.  Precautions discussed.  Electronically Signed Pleasant Brilliant, MD 4/30/20258:57 AM

## 2023-08-22 NOTE — Patient Instructions (Signed)
 Begin OT

## 2023-08-24 ENCOUNTER — Encounter: Payer: Self-pay | Admitting: Orthopaedic Surgery

## 2023-08-24 ENCOUNTER — Telehealth: Payer: Self-pay | Admitting: Orthopaedic Surgery

## 2023-08-24 NOTE — Telephone Encounter (Signed)
 Stacy [name] faxed the order to [facility]- I called and left message on mobile letting her know it has been faxed

## 2023-08-24 NOTE — Telephone Encounter (Signed)
 Dr. Vicente Graham - pt lvm stating that she has a referral to go to PT at [facility], but their last appt time is at 3:30pm and she can't do that, she would like her referral sent to [facility] as their last appt is at 5pm.  She has an appointment there on Monday.

## 2023-09-05 ENCOUNTER — Ambulatory Visit (HOSPITAL_COMMUNITY): Attending: Orthopaedic Surgery | Admitting: Occupational Therapy

## 2023-09-05 ENCOUNTER — Other Ambulatory Visit: Payer: Self-pay

## 2023-09-05 ENCOUNTER — Encounter (HOSPITAL_COMMUNITY): Payer: Self-pay | Admitting: Occupational Therapy

## 2023-09-05 ENCOUNTER — Other Ambulatory Visit: Payer: Self-pay | Admitting: Orthopaedic Surgery

## 2023-09-05 DIAGNOSIS — S52125D Nondisplaced fracture of head of left radius, subsequent encounter for closed fracture with routine healing: Secondary | ICD-10-CM

## 2023-09-05 DIAGNOSIS — R29898 Other symptoms and signs involving the musculoskeletal system: Secondary | ICD-10-CM | POA: Insufficient documentation

## 2023-09-05 DIAGNOSIS — M25522 Pain in left elbow: Secondary | ICD-10-CM | POA: Diagnosis not present

## 2023-09-05 NOTE — Patient Instructions (Signed)
 AROM Exercises   *Complete exercises 10 times each, 2-3 times per day*  1) Wrist Flexion  Start with wrist at edge of table, palm facing up. With wrist hanging slightly off table, curl wrist upward, and back down.      2) Wrist Extension  Start with wrist at edge of table, palm facing down. With wrist slightly off the edge of the table, curl wrist up and back down.      3) Radial Deviations  Start with forearm flat against a table, wrist hanging slightly off the edge, and palm facing the wall. Bending at the wrist only, and keeping palm facing the wall, bend wrist so fist is pointing towards the floor, back up to start position, and up towards the ceiling. Return to start.        4) WRIST PRONATION  Turn your forearm towards palm face down.  Keep your elbow bent and by the side of your  Body.      5) WRIST SUPINATION  Turn your forearm towards palm face up.  Keep your elbow bent and by the side of your  Body.      6) Elbow flexion and extension Bend your elbow upwards as shown and then lower to a straighten position.

## 2023-09-05 NOTE — Therapy (Signed)
 OUTPATIENT OCCUPATIONAL THERAPY ORTHO EVALUATION  Patient Name: Stacy [name] MRN: [id number] DOB:August 11, 1960, 63 y.o., female Today's Date: 09/05/2023    END OF SESSION:  OT End of Session - 09/05/23 1612     Visit Number 1    Number of Visits 3    Date for OT Re-Evaluation 10/19/23    Authorization Type [employer]    Authorization Time Period no auth required    OT Start Time 1520    OT Stop Time 1548    OT Time Calculation (min) 28 min    Activity Tolerance Patient tolerated treatment well    Behavior During Therapy [facility] for tasks assessed/performed             Past Medical History:  Diagnosis Date   Abnormal uterine bleeding    Chronic leukopenia 08/17/2016   WBC = 3.5   DDD (degenerative disc disease), cervical    Dyslipidemia    Elevated serum creatinine 08/17/2016   GERD (gastroesophageal reflux disease)    History of esophageal dilatation    2013   History of ovarian cyst    Hypothyroidism    Lactose intolerance    OSA on CPAP    study done 04-08-2013   Pre-diabetes    Right thyroid  nodule    SUI (stress urinary incontinence, female)    Tonsillar exudate 08/13/2014   Wears glasses    Past Surgical History:  Procedure Laterality Date   CESAREAN SECTION  1997   Twins-1 twin del.by c/s, 1 del.vaginally   COLONOSCOPY  04/04/2011   Internal hemorrhoids/Nl colonoscopy-SLIGHTLY TORTUOUS COLON   DILATATION & CURETTAGE/HYSTEROSCOPY WITH MYOSURE N/A 08/24/2016   Procedure: DILATATION & CURETTAGE/HYSTEROSCOPY WITH MYOSURE Possible Myosure ;  Surgeon: Greta Leatherwood, MD;  Location: [facility];  Service: Gynecology;  Laterality: N/A;   DILATION AND CURETTAGE OF UTERUS  1998   Seconday to miscarriage   ESOPHAGOGASTRODUODENOSCOPY  09/22/2011   Mild gastritis/Hiatal hernia/DYSPHAGIA DUE TO PEPTIC STRICTURE/UNCONTROLLED GERD   KNEE ARTHROSCOPY Right 2003   MALONEY DILATION  09/22/2011   Procedure: MALONEY DILATION;  Surgeon: Alyce Jubilee, MD;   Location: [facility];  Service: Endoscopy;  Laterality: N/A;   REPLACEMENT TOTAL KNEE Right 2022   SAVORY DILATION  09/22/2011   Procedure: SAVORY DILATION;  Surgeon: Alyce Jubilee, MD;  Location: [facility];  Service: Endoscopy;  Laterality: N/A;   Patient Active Problem List   Diagnosis Date Noted   OSA (obstructive sleep apnea) 09/04/2022   Annual visit for general adult medical examination with abnormal findings 09/04/2022   Esophageal dysphagia 09/04/2022   RUQ pain 09/04/2022   Calcific tendinitis of left shoulder 01/02/2022   Pain of left calf 07/01/2021   Arthrofibrosis of knee joint, right 12/26/2020   Mixed hyperlipidemia 12/18/2019   Sacroiliitis (HCC) 05/29/2019   Elevated serum creatinine 08/26/2018   Dyspepsia 06/03/2018   Vitamin D  deficiency 04/05/2017   Primary hypothyroidism 03/11/2015   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 Alberteen Huge REFERRING PROVIDER: Dr. Pleasant Brilliant  ONSET DATE: 08/07/23  REFERRING DIAG: U98.119J (ICD-10-CM) - Closed nondisplaced fracture of head of left radius with routine healing, subsequent encounter   THERAPY DIAG:  Pain in left elbow  Other symptoms and signs involving the musculoskeletal system  Rationale for Evaluation and Treatment: Rehabilitation  SUBJECTIVE:   SUBJECTIVE STATEMENT: S: "I've been stretching it"  Pt accompanied by: self  PERTINENT HISTORY: Pt is a 63 y/o female s/p left radial head fracture on 08/07/23 sustained when she  fell off of a ladder. Pt was originally in a posterior splint, this has been removed and pt is in a sling.   PRECAUTIONS: Other: See    WEIGHT BEARING RESTRICTIONS: Yes NWB  PAIN:  Are you having pain? Yes: NPRS scale: 2/10 Pain location: left elbow Pain description: sore and aching Aggravating factors: use Relieving factors: medication, stretches  FALLS: Has patient fallen in last 6 months? Yes. Number of falls 1   PLOF: Independent  PATIENT GOALS: To have full motion  NEXT MD VISIT: 09/12/23  OBJECTIVE:  Note: Objective measures were completed at Evaluation unless otherwise noted.  HAND DOMINANCE: Right  ADLs: Pt is having difficulty with gripping anything with weight, unable to lift at this time. Pt is unable to reach top of her head and back of her shoulders. Pt is sleeping in the recliner at this time, unable to sleep in the bed due to rolling on it.   FUNCTIONAL OUTCOME MEASURES: Quick Dash: 59.09  UPPER EXTREMITY ROM:     Active ROM Left eval  Elbow flexion 113  Elbow extension 0  Wrist flexion 32  Wrist extension 58  Wrist ulnar deviation 28  Wrist radial deviation 18  Wrist pronation 90  Wrist supination 90  (Blank rows = not tested)    UPPER EXTREMITY MMT:       Assessed on observation versus formal MMT as pt is only 4 weeks s/p fracture  MMT Left eval  Elbow flexion 3/5  Elbow extension 3/5  Wrist flexion 3/5  Wrist extension 3/5  Wrist ulnar deviation 3/5  Wrist radial deviation 3/5  Wrist pronation 3/5  Wrist supination 3/5  (Blank rows = not tested)  HAND FUNCTION: Grip strength: Right: 58 lbs; Left: 32 lbs, Lateral pinch: Right: 13 lbs, Left: 13 lbs, and 3 point pinch: Right: 10 lbs, Left: 5 lbs  SENSATION: WFL  EDEMA: None  COGNITION: Overall cognitive status: Within functional limits for tasks assessed   TREATMENT DATE:  Eval:  -Wrist A/ROM: flexion, extension, ulnar/radial deviation, forearm supination/pronation, 10  reps -Elbow: flexion, extension in 3 planes-bicep curl, hammer curl, pronated curl, 10 reps                                                                                                                               PATIENT EDUCATION: Education details: wrist and elbow A/ROM Person educated: Patient Education method: Explanation, Demonstration, and Handouts Education comprehension: verbalized understanding and returned demonstration  HOME EXERCISE PROGRAM: Eval: wrist and elbow A/ROM   GOALS: Goals reviewed with patient? Yes  SHORT TERM GOALS: Target date: 09/26/23  Pt will be provided with and educated on HEP to improve mobility and strength in LUE required for ADL completion.   Goal status: INITIAL    LONG TERM GOALS: Target date: 10/19/23  Pt will increase LUE A/ROM by 10+ degrees to improve mobility required for reaching overhead and behind back during ADL completion.  Goal status: INITIAL  2.  Pt will increase left grip strength by 20# and pinch strength by 5# to improve ability to grasp and maintain hold on objects during ADLs and work tasks.   Goal status: INITIAL  3.  Pt will decrease pain in LUE to 2/10 or less to improve ability to sleep in her bed for 2+ consecutive hours.   Goal status: INITIAL  4.  Pt will increase LUE strength to 4+/5 or greater to improve ability to lift items and perform heavy work with the LUE during housework, yardwork,etc.   Goal status: INITIAL    ASSESSMENT:  CLINICAL IMPRESSION: Patient is a 63 y.o. female who was seen today for occupational therapy evaluation s/p left radial head fracture. Pt has been stretching her LUE into extension and has full elbow extension upon assessment. Pt does have pain and strength limitations, including grip and pinch strength, limiting ADL completion and incorporating the LUE. Provided HEP to start on, pt completing and performing with good form and technique.   PERFORMANCE DEFICITS: in functional  skills including ADLs, IADLs, coordination, ROM, strength, pain, fascial restrictions, and UE functional use  IMPAIRMENTS: are limiting patient from ADLs, IADLs, rest and sleep, work, and leisure.   COMORBIDITIES: has no other co-morbidities that affects occupational performance. Patient will benefit from skilled OT to address above impairments and improve overall function.  MODIFICATION OR ASSISTANCE TO COMPLETE EVALUATION: No modification of tasks or assist necessary to complete an evaluation.  OT OCCUPATIONAL PROFILE AND HISTORY: Problem focused assessment: Including review of records relating to presenting problem.  CLINICAL DECISION MAKING: LOW - limited treatment options, no task modification necessary  REHAB POTENTIAL: Good  EVALUATION COMPLEXITY: Low      PLAN:  OT FREQUENCY: every other week  OT DURATION: 6 weeks  PLANNED INTERVENTIONS: 97168 OT Re-evaluation, 97535 self care/ADL training, 14782 therapeutic exercise, 97530 therapeutic activity, 97112 neuromuscular re-education, 97140 manual therapy, 97035 ultrasound, 97014 electrical stimulation unattended, patient/family education, and DME and/or AE instructions  RECOMMENDED OTHER SERVICES: None at this time  CONSULTED AND AGREED WITH PLAN OF CARE: Patient  PLAN FOR NEXT SESSION: Follow up on HEP, initiate strengthening, grip and pinch tasks   Lafonda Piety, OTR/L  [phone] 09/05/2023, 4:13 PM

## 2023-09-07 DIAGNOSIS — R7303 Prediabetes: Secondary | ICD-10-CM | POA: Diagnosis not present

## 2023-09-07 DIAGNOSIS — E782 Mixed hyperlipidemia: Secondary | ICD-10-CM | POA: Diagnosis not present

## 2023-09-07 DIAGNOSIS — E039 Hypothyroidism, unspecified: Secondary | ICD-10-CM | POA: Diagnosis not present

## 2023-09-07 DIAGNOSIS — E042 Nontoxic multinodular goiter: Secondary | ICD-10-CM | POA: Diagnosis not present

## 2023-09-08 LAB — COMPREHENSIVE METABOLIC PANEL WITH GFR
ALT: 23 IU/L (ref 0–32)
AST: 26 IU/L (ref 0–40)
Albumin: 4.6 g/dL (ref 3.9–4.9)
Alkaline Phosphatase: 102 IU/L (ref 44–121)
BUN/Creatinine Ratio: 9 — ABNORMAL LOW (ref 12–28)
BUN: 9 mg/dL (ref 8–27)
Bilirubin Total: 0.4 mg/dL (ref 0.0–1.2)
CO2: 25 mmol/L (ref 20–29)
Calcium: 9.6 mg/dL (ref 8.7–10.3)
Chloride: 104 mmol/L (ref 96–106)
Creatinine, Ser: 1.05 mg/dL — ABNORMAL HIGH (ref 0.57–1.00)
Globulin, Total: 3.3 g/dL (ref 1.5–4.5)
Glucose: 97 mg/dL (ref 70–99)
Potassium: 4.1 mmol/L (ref 3.5–5.2)
Sodium: 143 mmol/L (ref 134–144)
Total Protein: 7.9 g/dL (ref 6.0–8.5)
eGFR: 60 mL/min/{1.73_m2} (ref >59)

## 2023-09-08 LAB — LIPID PANEL
Chol/HDL Ratio: 4 ratio (ref 0.0–4.4)
Cholesterol, Total: 209 mg/dL — ABNORMAL HIGH (ref 100–199)
HDL: 52 mg/dL (ref >39)
LDL Chol Calc (NIH): 136 mg/dL — ABNORMAL HIGH (ref 0–99)
Triglycerides: 115 mg/dL (ref 0–149)
VLDL Cholesterol Cal: 21 mg/dL (ref 5–40)

## 2023-09-08 LAB — T4, FREE: Free T4: 0.91 ng/dL (ref 0.82–1.77)

## 2023-09-08 LAB — TSH: TSH: 8.75 u[IU]/mL — ABNORMAL HIGH (ref 0.450–4.500)

## 2023-09-12 ENCOUNTER — Encounter: Payer: Self-pay | Admitting: Orthopaedic Surgery

## 2023-09-12 ENCOUNTER — Ambulatory Visit (INDEPENDENT_AMBULATORY_CARE_PROVIDER_SITE_OTHER): Admitting: Orthopaedic Surgery

## 2023-09-12 ENCOUNTER — Ambulatory Visit (INDEPENDENT_AMBULATORY_CARE_PROVIDER_SITE_OTHER): Payer: Self-pay | Admitting: "Endocrinology

## 2023-09-12 ENCOUNTER — Encounter: Payer: Self-pay | Admitting: "Endocrinology

## 2023-09-12 VITALS — BP 130/78 | HR 60 | Ht 67.0 in | Wt 189.2 lb

## 2023-09-12 DIAGNOSIS — R7303 Prediabetes: Secondary | ICD-10-CM | POA: Diagnosis not present

## 2023-09-12 DIAGNOSIS — E782 Mixed hyperlipidemia: Secondary | ICD-10-CM | POA: Diagnosis not present

## 2023-09-12 DIAGNOSIS — S52125D Nondisplaced fracture of head of left radius, subsequent encounter for closed fracture with routine healing: Secondary | ICD-10-CM

## 2023-09-12 DIAGNOSIS — E039 Hypothyroidism, unspecified: Secondary | ICD-10-CM | POA: Diagnosis not present

## 2023-09-12 LAB — POCT GLYCOSYLATED HEMOGLOBIN (HGB A1C): HbA1c, POC (controlled diabetic range): 5.9 % (ref 0.0–7.0)

## 2023-09-12 MED ORDER — LEVOTHYROXINE SODIUM 137 MCG PO TABS: 137.0000 ug | ORAL_TABLET | Freq: Every day | ORAL | 3 refills | Status: DC

## 2023-09-12 NOTE — Progress Notes (Signed)
 I am doing well.  She has near full ROM of the left elbow with no pain.  NV intact.  Encounter Diagnosis  Name Primary?   Closed nondisplaced fracture of head of left radius with routine healing, subsequent encounter Yes   Discharge.  Call if any problem.  Precautions discussed.  Electronically Signed Pleasant Brilliant, MD 5/21/20251:59 PM

## 2023-09-12 NOTE — Progress Notes (Signed)
 09/12/2023      Endocrinology follow-up note   Subjective:    Patient ID: Stacy [name], female    DOB: 12-11-1960,  PCP Towanda Fret, MD   Past Medical History:  Diagnosis Date   Abnormal uterine bleeding    Chronic leukopenia 08/17/2016   WBC = 3.5   DDD (degenerative disc disease), cervical    Dyslipidemia    Elevated serum creatinine 08/17/2016   GERD (gastroesophageal reflux disease)    History of esophageal dilatation    2013   History of ovarian cyst    Hypothyroidism    Lactose intolerance    OSA on CPAP    study done 04-08-2013   Pre-diabetes    Right thyroid  nodule    SUI (stress urinary incontinence, female)    Tonsillar exudate 08/13/2014   Wears glasses    Past Surgical History:  Procedure Laterality Date   CESAREAN SECTION  1997   Twins-1 twin del.by c/s, 1 del.vaginally   COLONOSCOPY  04/04/2011   Internal hemorrhoids/Nl colonoscopy-SLIGHTLY TORTUOUS COLON   DILATATION & CURETTAGE/HYSTEROSCOPY WITH MYOSURE N/A 08/24/2016   Procedure: DILATATION & CURETTAGE/HYSTEROSCOPY WITH MYOSURE Possible Myosure ;  Surgeon: Greta Leatherwood, MD;  Location: [facility];  Service: Gynecology;  Laterality: N/A;   DILATION AND CURETTAGE OF UTERUS  1998   Seconday to miscarriage   ESOPHAGOGASTRODUODENOSCOPY  09/22/2011   Mild gastritis/Hiatal hernia/DYSPHAGIA DUE TO PEPTIC STRICTURE/UNCONTROLLED GERD   KNEE ARTHROSCOPY Right 2003   MALONEY DILATION  09/22/2011   Procedure: MALONEY DILATION;  Surgeon: Alyce Jubilee, MD;  [facility];  Service: Endoscopy;  Laterality: N/A;   REPLACEMENT TOTAL KNEE Right 2022   SAVORY DILATION  09/22/2011   Procedure: SAVORY DILATION;  Surgeon: Alyce Jubilee, MD;  Location: [facility];  Service: Endoscopy;  Laterality: N/A;   Social History   Socioeconomic History   Marital status: Married    Spouse name: Not on file   Number of children: Not on file   Years of education: Not on file   Highest  education level: Not on file  Occupational History   Occupation: Full time: [employer]  Tobacco Use   Smoking status: Never   Smokeless tobacco: Never  Vaping Use   Vaping status: Never Used  Substance and Sexual Activity   Alcohol use: No   Drug use: No   Sexual activity: Yes    Partners: Male    Birth control/protection: Other-see comments    Comment: husband with vasectomy  Other Topics Concern   Not on file  Social History Narrative   Married to Dr. Joleen Navy, neurology   Regular exercise: No   Social Drivers of Health   Financial Resource Strain: Not on file  Food Insecurity: Not on file  Transportation Needs: Not on file  Physical Activity: Not on file  Stress: Not on file  Social Connections: Unknown (08/29/2021)   Received from [facility], [facility]   Social Network    Social Network: Not on file   Outpatient Encounter Medications as of 09/12/2023  Medication Sig   celecoxib  (CELEBREX ) 200 MG capsule Take 1 capsule (200 mg total) by mouth 2 (two) times daily.   Cholecalciferol (VITAMIN D ) 10 MCG/ML LIQD Vitamin D  (Patient not taking: Reported on 09/12/2023)   Fish Oil-Cholecalciferol (FISH OIL + D3) 1200-1000 MG-UNIT CAPS Take by mouth. (Patient not taking: Reported on 09/12/2023)   ibuprofen  (ADVIL ) 800 MG tablet Take 1 tablet (800 mg total) by mouth  every 8 (eight) hours as needed.   levothyroxine  (SYNTHROID ) 137 MCG tablet Take 1 tablet (137 mcg total) by mouth daily before breakfast.   Multiple Vitamin (MULTI VITAMIN) TABS 1 tablet Orally Once a day for 30 day(s) (Patient not taking: Reported on 09/12/2023)   omeprazole  (PRILOSEC) 40 MG capsule Take 1 capsule (40 mg total) by mouth daily. (Patient not taking: Reported on 09/12/2023)   [DISCONTINUED] levothyroxine  (SYNTHROID ) 137 MCG tablet Take 1 tablet (137 mcg total) by mouth daily before breakfast. (Patient not taking: Reported on 09/12/2023)   No facility-administered encounter medications on file as of  09/12/2023.   ALLERGIES: Allergies  Allergen Reactions   Lactose Intolerance (Gi)     Vomiting, diarrhea, bloating   VACCINATION STATUS: Immunization History  Administered Date(s) Administered   Influenza Split 02/10/2014   Influenza,inj,Quad PF,6+ Mos 01/30/2013, 01/12/2015, 01/30/2017, 04/22/2018, 01/29/2019, 03/11/2021, 01/02/2022   Moderna Sars-Covid-2 Vaccination 07/09/2019, 07/29/2019, 03/20/2020   Tdap 05/09/2012, 09/01/2022   Zoster Recombinant(Shingrix ) 05/21/2018, 03/11/2021    HPI  63 yr old female with hypothyroidism from Hashimoto's thyroiditis. She was left on levothyroxine  137 mcg p.o. daily before breakfast during her last visit.  After she finished her supplies for 6 months, she ran out several weeks ago.  Her previsit labs are consistent with under replacement.  She reports fatigue and feeling sleepy.  She also has prediabetes with point-of-care A1c of 5.9% today.  She has uncontrolled dyslipidemia, not taking her Crestor .     -  She denies tremors, heat intolerance.  Her previsit thyroid  function tests are consistent with slight over-replacement.     she denies any family hx of thyroid  dysfunction. She has a stable  1.0 cm right thyroid  lobe nodule which has remained stable for 7 years.   -She is a mother of  4 grown children, with hx of HEELP syndrome during the first pregnancy. -She did not tolerate metformin  which was prescribed due to prediabetes. -She is on ongoing vitamin D  supplement.       Review of Systems Limited as above.  Objective:    BP 130/78   Pulse 60   Ht 5\' 7"  (1.702 m)   Wt 189 lb 3.2 oz (85.8 kg)   LMP 09/20/2016 (Approximate)   BMI 29.63 kg/m   Wt Readings from Last 3 Encounters:  09/12/23 189 lb 3.2 oz (85.8 kg)  10/17/22 189 lb 9.6 oz (86 kg)  09/29/22 192 lb (87.1 kg)      Results for orders placed or performed in visit on 08/08/23  Comprehensive metabolic panel with GFR   Collection Time: 09/07/23 10:45 AM  Result Value  Ref Range   Glucose 97 70 - 99 mg/dL   BUN 9 8 - 27 mg/dL   Creatinine, Ser 1.61 (H) 0.57 - 1.00 mg/dL   eGFR 60 >09 UE/AVW/0.98   BUN/Creatinine Ratio 9 (L) 12 - 28   Sodium 143 134 - 144 mmol/L   Potassium 4.1 3.5 - 5.2 mmol/L   Chloride 104 96 - 106 mmol/L   CO2 25 20 - 29 mmol/L   Calcium  9.6 8.7 - 10.3 mg/dL   Total Protein 7.9 6.0 - 8.5 g/dL   Albumin 4.6 3.9 - 4.9 g/dL   Globulin, Total 3.3 1.5 - 4.5 g/dL   Bilirubin Total 0.4 0.0 - 1.2 mg/dL   Alkaline Phosphatase 102 44 - 121 IU/L   AST 26 0 - 40 IU/L   ALT 23 0 - 32 IU/L  T4, Free   Collection  Time: 09/07/23 10:45 AM  Result Value Ref Range   Free T4 0.91 0.82 - 1.77 ng/dL  TSH   Collection Time: 09/07/23 10:45 AM  Result Value Ref Range   TSH 8.750 (H) 0.450 - 4.500 uIU/mL  Lipid Panel   Collection Time: 09/07/23 10:45 AM  Result Value Ref Range   Cholesterol, Total 209 (H) 100 - 199 mg/dL   Triglycerides 027 0 - 149 mg/dL   HDL 52 >25 mg/dL   VLDL Cholesterol Cal 21 5 - 40 mg/dL   LDL Chol Calc (NIH) 366 (H) 0 - 99 mg/dL   Chol/HDL Ratio 4.0 0.0 - 4.4 ratio   Chemistry (most recent): Lab Results  Component Value Date   NA 143 09/07/2023   K 4.1 09/07/2023   CL 104 09/07/2023   CO2 25 09/07/2023   BUN 9 09/07/2023   CREATININE 1.05 (H) 09/07/2023   Diabetic Labs (most recent): Lab Results  Component Value Date   HGBA1C 5.9 10/17/2022   HGBA1C 6.5 (H) 09/25/2022   HGBA1C 6.2 (H) 06/29/2021   Lipid Panel     Component Value Date/Time   CHOL 209 (H) 09/07/2023 1045   TRIG 115 09/07/2023 1045   HDL 52 09/07/2023 1045   CHOLHDL 4.0 09/07/2023 1045   CHOLHDL 4.4 05/29/2019 1412   VLDL 19 03/09/2015 1509   LDLCALC 136 (H) 09/07/2023 1045   LDLCALC 124 (H) 05/29/2019 1412     Assessment & Plan:   1) hypothyroidism from Hashimoto's thyroiditis:   - Her thyroid  function test are consistent with under replacement, largely due to treatment withdrawal.  I discussed and reinitiated levothyroxine  137  mcg p.o. daily before breakfast.     - We discussed about the correct intake of her thyroid  hormone, on empty stomach at fasting, with water , separated by at least 30 minutes from breakfast and other medications,  and separated by more than 4 hours from calcium , iron, multivitamins, acid reflux medications (PPIs). -Patient is made aware of the fact that thyroid  hormone replacement is needed for life, dose to be adjusted by periodic monitoring of thyroid  function tests.   2) multinodular goiter  -Her previsit thyroid  ultrasound shows stable 1 cm nodule on the right lobe, stable finding for 7 years.  She will not need any further thyroid  imaging.      3) prediabetes:  -Her point-of-care A1c today is 5.9% improving from 6.5%.   She does not tolerate metformin .  Given the fact that she has metabolic syndrome with prediabetes, hyperlipidemia, she was offered lifestyle  medicine package and she is engaging progressively.    - she acknowledges that there is a room for improvement in her food and drink choices. - Suggestion is made for her to avoid simple carbohydrates  from her diet including Cakes, Sweet Desserts, Ice Cream, Soda (diet and regular), Sweet Tea, Candies, Chips, Cookies, Store Bought Juices, Alcohol , Artificial Sweeteners,  Coffee Creamer, and "Sugar-free" Products, Lemonade. This will help patient to have more stable blood glucose profile and potentially avoid unintended weight gain.  The following Lifestyle Medicine recommendations according to American College of Lifestyle Medicine  [facility]) were discussed and and offered to patient and she  agrees to start the journey:  A. Whole Foods, Plant-Based Nutrition comprising of fruits and vegetables, plant-based proteins, whole-grain carbohydrates was discussed in detail with the patient.   A list for source of those nutrients were also provided to the patient.  Patient will use only water  or unsweetened tea  for hydration. B.  The need to  stay away from risky substances including alcohol, smoking; obtaining 7 to 9 hours of restorative sleep, at least 150 minutes of moderate intensity exercise weekly, the importance of healthy social connections,  and stress management techniques were discussed. C.  A full color page of  Calorie density of various food groups per pound showing examples of each food groups was provided to the patient.      4) mixed hyperlipidemia -She has not taken her Crestor , still hesitates.  Her previsit labs show worsening of dyslipidemia with LDL of 136 increasing from 109.   She promises to do food as medicine,   the above detailed WF PB diet will help with dyslipidemia as well.    5) vitamin D  deficiency: She is currently on vitamin D  replacement at 5000 units daily.  She is advised to maintain close follow-up with her PMD Dr. Alberteen Huge.   I spent  25  minutes in the care of the patient today including review of labs from Thyroid  Function, CMP, and other relevant labs ; imaging/biopsy records (current and previous including abstractions from other facilities); face-to-face time discussing  her lab results and symptoms, medications doses, her options of short and long term treatment based on the latest standards of care / guidelines;   and documenting the encounter.  Stacy [name]  participated in the discussions, expressed understanding, and voiced agreement with the above plans.  All questions were answered to her satisfaction. she is encouraged to contact clinic should she have any questions or concerns prior to her return visit.   Follow up plan: Return in about 6 months (around 03/14/2024) for Fasting Labs  in AM B4 8, A1c -NV.  Kalvin Orf, MD Phone: [phone]  Fax: [phone]  -  This note was partially dictated with voice recognition software. Similar sounding words can be transcribed inadequately or may not  be corrected upon review.  09/12/2023, 3:24 PM

## 2023-09-13 ENCOUNTER — Encounter: Payer: Self-pay | Admitting: "Endocrinology

## 2023-09-13 ENCOUNTER — Other Ambulatory Visit: Payer: Self-pay

## 2023-09-13 DIAGNOSIS — E039 Hypothyroidism, unspecified: Secondary | ICD-10-CM

## 2023-09-13 MED ORDER — LEVOTHYROXINE SODIUM 137 MCG PO TABS: 137.0000 ug | ORAL_TABLET | Freq: Every day | ORAL | 3 refills | Status: DC

## 2023-09-13 MED ORDER — LEVOTHYROXINE SODIUM 137 MCG PO TABS: 137.0000 ug | ORAL_TABLET | Freq: Every day | ORAL | 1 refills | Status: DC

## 2023-09-22 ENCOUNTER — Encounter: Payer: Self-pay | Admitting: Emergency Medicine

## 2023-09-22 ENCOUNTER — Ambulatory Visit
Admission: EM | Admit: 2023-09-22 | Discharge: 2023-09-22 | Disposition: A | Discharge status: Home / Self Care | Attending: Family Medicine | Admitting: Family Medicine

## 2023-09-22 ENCOUNTER — Other Ambulatory Visit: Payer: Self-pay

## 2023-09-22 DIAGNOSIS — M7711 Lateral epicondylitis, right elbow: Secondary | ICD-10-CM | POA: Diagnosis not present

## 2023-09-22 MED ORDER — PREDNISONE 20 MG PO TABS: 40.0000 mg | ORAL_TABLET | Freq: Every day | ORAL | 0 refills | Status: DC

## 2023-09-22 NOTE — Discharge Instructions (Addendum)
 Pick up a tennis elbow brace at your pharmacy and wear for the next 1-2 weeks.

## 2023-09-22 NOTE — ED Triage Notes (Addendum)
 Pt reports intermittent right arm pain and numbness in ring and little finger of right hand. Denies any known injury.  Pt reports is currently in physical therapy related to left elbow since April and is currently taking NSAIDS.

## 2023-09-24 ENCOUNTER — Ambulatory Visit (HOSPITAL_COMMUNITY): Attending: Orthopaedic Surgery | Admitting: Occupational Therapy

## 2023-09-24 ENCOUNTER — Encounter (HOSPITAL_COMMUNITY): Payer: Self-pay | Admitting: Occupational Therapy

## 2023-09-24 DIAGNOSIS — R29898 Other symptoms and signs involving the musculoskeletal system: Secondary | ICD-10-CM | POA: Insufficient documentation

## 2023-09-24 DIAGNOSIS — M25522 Pain in left elbow: Secondary | ICD-10-CM | POA: Diagnosis present

## 2023-09-24 DIAGNOSIS — M25512 Pain in left shoulder: Secondary | ICD-10-CM | POA: Insufficient documentation

## 2023-09-24 NOTE — ED Provider Notes (Signed)
 [facility]   [id number] 09/22/23 Arrival Time: 1232  ASSESSMENT & PLAN:  1. Lateral epicondylitis of right elbow      Discharge Instructions      Pick up a tennis elbow brace at your pharmacy and wear for the next 1-2 weeks.   Discharge Medication List as of 09/22/2023  2:10 PM     START taking these medications   Details  predniSONE  (DELTASONE ) 20 MG tablet Take 2 tablets (40 mg total) by mouth daily., Starting Sat 09/22/2023, Normal       Work/school excuse note: not needed. Recommend:  Follow-up Information     Towanda Fret, MD.   Specialty: Family Medicine Why: If worsening or failing to improve as anticipated. Contact information: [address] [place] [address] [phone]                Reviewed expectations re: course of current medical issues. Questions answered. Outlined signs and symptoms indicating need for more acute intervention. Patient verbalized understanding. After Visit Summary given.  SUBJECTIVE: History from: patient. Stacy [name] is a 63 y.o. female who reports intermittent right arm/elbow pain and numbness in ring and little finger of right hand. Denies any known injury. Admits repetitive use of RUE. No tx PTA.  Pt reports is currently in physical therapy related to left elbow since April and is currently taking NSAIDS.   Past Surgical History:  Procedure Laterality Date   CESAREAN SECTION  1997   Twins-1 twin del.by c/s, 1 del.vaginally   COLONOSCOPY  04/04/2011   Internal hemorrhoids/Nl colonoscopy-SLIGHTLY TORTUOUS COLON   DILATATION & CURETTAGE/HYSTEROSCOPY WITH MYOSURE N/A 08/24/2016   Procedure: DILATATION & CURETTAGE/HYSTEROSCOPY WITH MYOSURE Possible Myosure ;  Surgeon: Greta Leatherwood, MD;  Location: [facility];  Service: Gynecology;  Laterality: N/A;   DILATION AND CURETTAGE OF UTERUS  1998   Seconday to miscarriage   ESOPHAGOGASTRODUODENOSCOPY  09/22/2011   Mild  gastritis/Hiatal hernia/DYSPHAGIA DUE TO PEPTIC STRICTURE/UNCONTROLLED GERD   KNEE ARTHROSCOPY Right 2003   MALONEY DILATION  09/22/2011   Procedure: MALONEY DILATION;  Surgeon: Alyce Jubilee, MD;  Location: [facility];  Service: Endoscopy;  Laterality: N/A;   REPLACEMENT TOTAL KNEE Right 2022   SAVORY DILATION  09/22/2011   Procedure: SAVORY DILATION;  Surgeon: Alyce Jubilee, MD;  Location: [facility];  Service: Endoscopy;  Laterality: N/A;      OBJECTIVE:  Vitals:   09/22/23 1314  BP: (!) 143/81  Pulse: 68  Resp: 20  Temp: 98.1 F (36.7 C)  TempSrc: Oral  SpO2: 93%    General appearance: alert; no distress HEENT: [place]; AT Neck: supple with FROM Resp: unlabored respirations Extremities: RUE: warm with well perfused appearance; well localized marked tenderness over right lateral epicondyle; without gross deformities; swelling: none; bruising: none; elbow ROM: normal CV: brisk extremity capillary refill of RUE; 2+ radial pulse of RUE. Skin: warm and dry; no visible rashes Neurologic: gait normal; normal sensation and strength of RUE Psychological: alert and cooperative; normal mood and affect    Allergies  Allergen Reactions   Lactose Intolerance (Gi)     Vomiting, diarrhea, bloating    Past Medical History:  Diagnosis Date   Abnormal uterine bleeding    Chronic leukopenia 08/17/2016   WBC = 3.5   DDD (degenerative disc disease), cervical    Dyslipidemia    Elevated serum creatinine 08/17/2016   GERD (gastroesophageal reflux disease)    History of esophageal dilatation  2013   History of ovarian cyst    Hypothyroidism    Lactose intolerance    OSA on CPAP    study done 04-08-2013   Pre-diabetes    Right thyroid  nodule    SUI (stress urinary incontinence, female)    Tonsillar exudate 08/13/2014   Wears glasses    Social History   Socioeconomic History   Marital status: Married    Spouse name: Not on file   Number of children: Not on file    Years of education: Not on file   Highest education level: Not on file  Occupational History   Occupation: Full time: [employer]  Tobacco Use   Smoking status: Never   Smokeless tobacco: Never  Vaping Use   Vaping status: Never Used  Substance and Sexual Activity   Alcohol use: No   Drug use: No   Sexual activity: Yes    Partners: Male    Birth control/protection: Other-see comments    Comment: husband with vasectomy  Other Topics Concern   Not on file  Social History Narrative   Married to Dr. Joleen Navy, neurology   Regular exercise: No   Social Drivers of Health   Financial Resource Strain: Not on file  Food Insecurity: Not on file  Transportation Needs: Not on file  Physical Activity: Not on file  Stress: Not on file  Social Connections: Unknown (08/29/2021)   Received from [employer], [facility]   Social Network    Social Network: Not on file   Family History  Problem Relation Age of Onset   Hypertension Mother    Angina Mother        MVP. h/o intestinal polyps possibly   Hyperlipidemia Mother    Transient ischemic attack Mother    Hypertension Father    Hyperlipidemia Father        glucose intolerant   Diabetes Father    GER disease Brother    Diabetes Brother    Diabetes Maternal Grandfather    Hyperlipidemia Paternal Grandmother    Thyroid  disease Paternal Grandmother    Hyperlipidemia Paternal Grandfather    Thyroid  disease Brother        hyperthyroid   Colon cancer Neg Hx    Breast cancer Neg Hx    Ovarian cancer Neg Hx    Anesthesia problems Neg Hx    Past Surgical History:  Procedure Laterality Date   CESAREAN SECTION  1997   Twins-1 twin del.by c/s, 1 del.vaginally   COLONOSCOPY  04/04/2011   Internal hemorrhoids/Nl colonoscopy-SLIGHTLY TORTUOUS COLON   DILATATION & CURETTAGE/HYSTEROSCOPY WITH MYOSURE N/A 08/24/2016   Procedure: DILATATION & CURETTAGE/HYSTEROSCOPY WITH MYOSURE Possible Myosure ;  Surgeon: Greta Leatherwood, MD;   Location: [facility];  Service: Gynecology;  Laterality: N/A;   DILATION AND CURETTAGE OF UTERUS  1998   Seconday to miscarriage   ESOPHAGOGASTRODUODENOSCOPY  09/22/2011   Mild gastritis/Hiatal hernia/DYSPHAGIA DUE TO PEPTIC STRICTURE/UNCONTROLLED GERD   KNEE ARTHROSCOPY Right 2003   MALONEY DILATION  09/22/2011   Procedure: MALONEY DILATION;  Surgeon: Alyce Jubilee, MD;  [facility];  Service: Endoscopy;  Laterality: N/A;   REPLACEMENT TOTAL KNEE Right 2022   SAVORY DILATION  09/22/2011   Procedure: SAVORY DILATION;  Surgeon: Alyce Jubilee, MD;  [facility]: [facility];  Service: Endoscopy;  Laterality: N/AAfton Albright, MD 09/24/23 [phone]

## 2023-09-24 NOTE — Patient Instructions (Signed)
 Complete all exercises 10x each, 1-2x/day. Use a low weight (1 or 2 lbs) for bicep curls.    1) Bicep Curl  Slowly curl one dumbbell up toward your shoulder, rotating your palm to face upward, then lower it back down.    2) Hammer Curl  Begin holding a dumbbell in one hand, with your thumb facing forward. Slowly curl the dumbbell up toward your shoulder, then lower it back down.   3) Pronated Curl  Begin holding a dumbbell in one hand, with your thumb pointing inward. Bend your elbow, bringing the dumbbell toward your shoulder, then lower it back down.     4) Wrist extension: -Fix the band firmly under your foot and hold the other end in your affected hand/arm.  -Place your elbow over your knee and let your affected wrist hang towards the floor, palm down.  -Pull back towards your body slowly, then let your wrist down.    4) Forearm supination -Fix band firmly under foot, hold other end in affected hand/arm.  -Place elbow on knee, with affected hand/arm in a thumbs up position. -Turn wrist into a palm up position, then back to neutral.   5) Pronation -Fix band firmly under foot, hold other end in affected hand/arm.  -Place elbow on knee, with affected hand/arm in thumbs up or palm up position.  -Turn hand to a palm down position, then back to neutral     6) Wrist Flexion  Lift your hand up, bending your wrist forward and pulling against the resistance. Hold briefly, then relax.

## 2023-09-24 NOTE — Therapy (Signed)
 OUTPATIENT OCCUPATIONAL THERAPY ORTHO TREATMENT  Patient Name: Stacy [name] MRN: [id number] DOB:01/22/1961, 63 y.o., female Today's Date: 09/24/2023    END OF SESSION:  OT End of Session - 09/24/23 0926     Visit Number 2    Number of Visits 3    Date for OT Re-Evaluation 10/19/23    Authorization Type [employer]    Authorization Time Period no auth required    OT Start Time 0847    OT Stop Time 0928    OT Time Calculation (min) 41 min    Activity Tolerance Patient tolerated treatment well    Behavior During Therapy [facility] for tasks assessed/performed              Past Medical History:  Diagnosis Date   Abnormal uterine bleeding    Chronic leukopenia 08/17/2016   WBC = 3.5   DDD (degenerative disc disease), cervical    Dyslipidemia    Elevated serum creatinine 08/17/2016   GERD (gastroesophageal reflux disease)    History of esophageal dilatation    2013   History of ovarian cyst    Hypothyroidism    Lactose intolerance    OSA on CPAP    study done 04-08-2013   Pre-diabetes    Right thyroid  nodule    SUI (stress urinary incontinence, female)    Tonsillar exudate 08/13/2014   Wears glasses    Past Surgical History:  Procedure Laterality Date   CESAREAN SECTION  1997   Twins-1 twin del.by c/s, 1 del.vaginally   COLONOSCOPY  04/04/2011   Internal hemorrhoids/Nl colonoscopy-SLIGHTLY TORTUOUS COLON   DILATATION & CURETTAGE/HYSTEROSCOPY WITH MYOSURE N/A 08/24/2016   Procedure: DILATATION & CURETTAGE/HYSTEROSCOPY WITH MYOSURE Possible Myosure ;  Surgeon: Greta Leatherwood, MD;  Location: [facility];  Service: Gynecology;  Laterality: N/A;   DILATION AND CURETTAGE OF UTERUS  1998   Seconday to miscarriage   ESOPHAGOGASTRODUODENOSCOPY  09/22/2011   Mild gastritis/Hiatal hernia/DYSPHAGIA DUE TO PEPTIC STRICTURE/UNCONTROLLED GERD   KNEE ARTHROSCOPY Right 2003   MALONEY DILATION  09/22/2011   Procedure: MALONEY DILATION;  Surgeon: Alyce Jubilee, MD;   [facility];  Service: Endoscopy;  Laterality: N/A;   REPLACEMENT TOTAL KNEE Right 2022   SAVORY DILATION  09/22/2011   Procedure: SAVORY DILATION;  Surgeon: Alyce Jubilee, MD;  [facility];  Service: Endoscopy;  Laterality: N/A;   Patient Active Problem List   Diagnosis Date Noted   OSA (obstructive sleep apnea) 09/04/2022   Annual visit for general adult medical examination with abnormal findings 09/04/2022   Esophageal dysphagia 09/04/2022   RUQ pain 09/04/2022   Calcific tendinitis of left shoulder 01/02/2022   Pain of left calf 07/01/2021   Arthrofibrosis of knee joint, right 12/26/2020   Mixed hyperlipidemia 12/18/2019   Sacroiliitis (HCC) 05/29/2019   Elevated serum creatinine 08/26/2018   Dyspepsia 06/03/2018   Vitamin D  deficiency 04/05/2017   Primary hypothyroidism 03/11/2015   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 Alberteen Huge REFERRING PROVIDER: Dr. Pleasant Brilliant  ONSET DATE: 08/07/23  REFERRING DIAG: W09.811B (ICD-10-CM) - Closed nondisplaced fracture of head of left radius with routine healing, subsequent encounter   THERAPY DIAG:  Pain in left elbow  Other symptoms and signs involving the musculoskeletal system  Acute pain of left shoulder  Rationale for Evaluation and Treatment: Rehabilitation  SUBJECTIVE:   SUBJECTIVE STATEMENT: S: "I'm not lifting anything."  PERTINENT HISTORY: Pt is a 63 y/o female s/p left radial head fracture on 08/07/23 sustained  when she fell off of a ladder. Pt was originally in a posterior splint, this has been removed and pt is in a sling.   PRECAUTIONS: Other: See    WEIGHT BEARING RESTRICTIONS: Yes NWB  PAIN:  Are you having pain? No  FALLS: Has patient fallen in last 6 months? Yes. Number of falls 1   PLOF: Independent  PATIENT GOALS: To have full motion  NEXT MD VISIT: None  OBJECTIVE:  Note: Objective measures were completed at Evaluation unless otherwise noted.  HAND DOMINANCE: Right  ADLs: Pt is having difficulty with gripping anything with weight, unable to lift at this time. Pt is unable to reach top of her head and back of her shoulders. Pt is sleeping in the recliner at this time, unable to sleep in the bed due to rolling on it.   FUNCTIONAL OUTCOME MEASURES: Quick Dash: 59.09  UPPER EXTREMITY ROM:     Active ROM Left eval Left 09/24/23  Elbow flexion 113 132  Elbow extension 0   Wrist flexion 32 62  Wrist extension 58 80  Wrist ulnar deviation 28   Wrist radial deviation 18   Wrist pronation 90   Wrist supination 90   (Blank rows = not tested)    UPPER EXTREMITY MMT:       Assessed on observation versus formal MMT as pt is only 4 weeks s/p fracture  MMT Left eval Left 09/24/23  Elbow flexion 3/5 4+/5  Elbow extension 3/5 5/5  Wrist flexion 3/5 5/5  Wrist extension 3/5 5/5  Wrist ulnar deviation 3/5 5/5  Wrist radial deviation 3/5 5/5  Wrist pronation 3/5 5/5  Wrist supination 3/5 5/5  (Blank rows = not tested)  HAND FUNCTION: Grip strength: Right: 58 lbs; Left: 32 lbs, Lateral pinch: Right: 13 lbs, Left: 13 lbs, and 3 point pinch: Right: 10 lbs, Left: 5 lbs 09/24/23: Grip strength: Left: 50 lbs, Lateral pinch: Left: 13 lbs, and 3 point pinch: Left: 6 lbs   TREATMENT DATE:  09/24/23 -Elbow strengthening:2#- flexion/extension-bicep curl, hammer curl, pronated curl, 10 reps -Wrist strengthening: red theraband-wrist flexion, extension, radial/ulnar deviation,  pronation, supination, 10 reps -Grip strengthening: large beads with gripper at 42#, medium beads with gripper at 38#, horizontal position -Pinch strengthening: using red clothespin and 3 point pinch, pt stacking 5 towers of 3 sponges, removing and replacing in bucket using lateral pinch -Theraputty: red-flatten, using pvc to cut circles into putty, rolling, gripping, pinching-lateral and 3 point  Eval:  -Wrist A/ROM: flexion, extension, ulnar/radial deviation, forearm supination/pronation, 10 reps -Elbow: flexion, extension in 3 planes-bicep curl, hammer curl, pronated curl, 10 reps  PATIENT EDUCATION: Education details: wrist and elbow strengthening with weight and theraband-red Person educated: Patient Education method: Programmer, multimedia, Demonstration, and Handouts Education comprehension: verbalized understanding and returned demonstration  HOME EXERCISE PROGRAM: Eval: wrist and elbow A/ROM 6/2: wrist and elbow strengthening with weight and theraband-red   GOALS: Goals reviewed with patient? Yes  SHORT TERM GOALS: Target date: 09/26/23  Pt will be provided with and educated on HEP to improve mobility and strength in LUE required for ADL completion.   Goal status: IN PROGRESS    LONG TERM GOALS: Target date: 10/19/23  Pt will increase LUE A/ROM by 10+ degrees to improve mobility required for reaching overhead and behind back during ADL completion.  Goal status: IN PROGRESS  2.  Pt will increase left grip strength by 20# and pinch strength by 5# to improve ability to grasp and maintain hold on objects during ADLs and work tasks.   Goal status: IN PROGRESS  3.  Pt will decrease pain in LUE to 2/10 or less to improve ability to sleep in her bed for 2+ consecutive hours.   Goal status: IN PROGRESS  4.  Pt will increase LUE strength to 4+/5 or greater to  improve ability to lift items and perform heavy work with the LUE during housework, yardwork,etc.   Goal status: IN PROGRESS    ASSESSMENT:  CLINICAL IMPRESSION: Pt reports she is completing her HEP, is not lifting much and can tell her grip is still weak. Pt progressed to strengthening today, ROM is excellent for elbow and wrist. Pt using 2# weight for free weights, red theraband for resistance strengthening. Pt completing grip and pinch strengthening, mod difficulty with hand gripper activity, mod fatigue during theraputty after completing prior grip/pinch tasks. Pt is making great progress towards goals and improved function of LUE.   PERFORMANCE DEFICITS: in functional skills including ADLs, IADLs, coordination, ROM, strength, pain, fascial restrictions, and UE functional use     PLAN:  OT FREQUENCY: every other week  OT DURATION: 6 weeks  PLANNED INTERVENTIONS: 97168 OT Re-evaluation, 97535 self care/ADL training, 16109 therapeutic exercise, 97530 therapeutic activity, 97112 neuromuscular re-education, 97140 manual therapy, 97035 ultrasound, 97014 electrical stimulation unattended, patient/family education, and DME and/or AE instructions  CONSULTED AND AGREED WITH PLAN OF CARE: Patient  PLAN FOR NEXT SESSION: Follow up on HEP, strengthening, grip and pinch tasks; REASSESSMENT   Lafonda Piety, OTR/L  [phone] 09/24/2023, 9:28 AM

## 2023-09-26 ENCOUNTER — Ambulatory Visit: Admitting: Sleep Medicine

## 2023-09-26 ENCOUNTER — Ambulatory Visit: Admitting: Pulmonary Disease

## 2023-09-26 ENCOUNTER — Encounter: Payer: Self-pay | Admitting: Sleep Medicine

## 2023-09-26 VITALS — BP 138/66 | HR 80 | Temp 97.9°F | Ht 67.0 in | Wt 194.0 lb

## 2023-09-26 DIAGNOSIS — Z6830 Body mass index (BMI) 30.0-30.9, adult: Secondary | ICD-10-CM

## 2023-09-26 DIAGNOSIS — G4733 Obstructive sleep apnea (adult) (pediatric): Secondary | ICD-10-CM

## 2023-09-26 DIAGNOSIS — E66811 Obesity, class 1: Secondary | ICD-10-CM | POA: Diagnosis not present

## 2023-09-26 DIAGNOSIS — E038 Other specified hypothyroidism: Secondary | ICD-10-CM | POA: Diagnosis not present

## 2023-09-26 NOTE — Patient Instructions (Signed)
 Stacy [name]

## 2023-09-26 NOTE — Progress Notes (Signed)
 Name:Stacy [name] MRN: [id number] DOB: 04-07-1961   CHIEF COMPLAINT:  Reassessment of OSA   HISTORY OF PRESENT ILLNESS:  Stacy [name] is a 63 y.o. w/ a h/o OSA, obesity, GERD and hypothyroidism who presents for reassessment of OSA. Reports c/o loud snoring, witnessed apnea and excessive daytime sleepiness which has been present for several years. Reports that she was initially diagnosed with OSA and was subsequently started on CPAP therapy. States that she used CPAP therapy for several years and discontinued therapy due to around 6 years ago.   Reports nocturnal awakenings due to nocturia, however does not have difficulty falling back to sleep. Reports a 20 lb weight gain over the last several months. Admits to morning headaches. Denies RLS symptoms, dream enactment, cataplexy, hypnagogic or hypnapompic hallucinations. Denies a family history of sleep apnea. Reports occasionally drowsy driving. Drinks 1 glass of iced tea daily, denies alcohol, tobacco or denies illicit drug use.   Bedtime 11:30 pm-12:30 am Sleep onset 1 min Rise time 5:45 am   EPWORTH SLEEP SCORE 17    09/26/2023    3:08 PM 09/29/2022   10:00 AM 12/30/2021    9:00 AM  Results of the Epworth flowsheet  Sitting and reading 3 3 3   Watching TV 3 3 3   Sitting, inactive in a public place (e.g. a theatre or a meeting) 3 2 0  As a passenger in a car for an hour without a break 3 3 3   Lying down to rest in the afternoon when circumstances permit 3 3 3   Sitting and talking to someone 0 1 0  Sitting quietly after a lunch without alcohol 2 2 2   In a car, while stopped for a few minutes in traffic 0 1 0  Total score 17 18 14     PAST MEDICAL HISTORY :   has a past medical history of Abnormal uterine bleeding, Chronic leukopenia (08/17/2016), DDD (degenerative disc disease), cervical, Dyslipidemia, Elevated serum creatinine (08/17/2016), GERD (gastroesophageal reflux disease), History of esophageal  dilatation, History of ovarian cyst, Hypothyroidism, Lactose intolerance, OSA on CPAP, Pre-diabetes, Right thyroid  nodule, SUI (stress urinary incontinence, female), Tonsillar exudate (08/13/2014), and Wears glasses.  has a past surgical history that includes Knee arthroscopy (Right, 2003); Colonoscopy (04/04/2011); Esophagogastroduodenoscopy (09/22/2011); Savory dilation (09/22/2011); maloney dilation (09/22/2011); Cesarean section (1997); Dilation and curettage of uterus (1998); Dilatation & curettage/hysteroscopy with myosure (N/A, 08/24/2016); and Replacement total knee (Right, 2022). Prior to Admission medications   Medication Sig Start Date End Date Taking? Authorizing Provider  celecoxib  (CELEBREX ) 200 MG capsule Take 1 capsule (200 mg total) by mouth 2 (two) times daily. 08/22/23  Yes Pleasant Brilliant, MD  ibuprofen  (ADVIL ) 800 MG tablet Take 1 tablet (800 mg total) by mouth every 8 (eight) hours as needed. 08/08/23  Yes Pleasant Brilliant, MD  levothyroxine  (SYNTHROID ) 137 MCG tablet Take 1 tablet (137 mcg total) by mouth daily before breakfast. 09/13/23  Yes Nida, Gebreselassie W, MD  omeprazole  (PRILOSEC) 40 MG capsule Take 1 capsule (40 mg total) by mouth daily. 09/01/22  Yes Towanda Fret, MD  predniSONE  (DELTASONE ) 20 MG tablet Take 2 tablets (40 mg total) by mouth daily. 09/22/23  Yes Afton Albright, MD   Allergies  Allergen Reactions   Lactose Intolerance (Gi)     Vomiting, diarrhea, bloating    FAMILY HISTORY:  family history includes Angina in her mother; Diabetes in her brother, father, and maternal grandfather; GER disease in her brother; Hyperlipidemia in her father, mother,  paternal grandfather, and paternal grandmother; Hypertension in her father and mother; Thyroid  disease in her brother and paternal grandmother; Transient ischemic attack in her mother. SOCIAL HISTORY:  reports that she has never smoked. She has never used smokeless tobacco. She reports that she does not drink  alcohol and does not use drugs.   Review of Systems:  Gen:  Denies  fever, sweats, chills weight loss  HEENT: Denies blurred vision, double vision, ear pain, eye pain, hearing loss, nose bleeds, sore throat Cardiac:  No dizziness, chest pain or heaviness, chest tightness,edema, No JVD Resp:   No cough, -sputum production, -shortness of breath,-wheezing, -hemoptysis,  Gi: Denies swallowing difficulty, stomach pain, nausea or vomiting, diarrhea, constipation, bowel incontinence Gu:  Denies bladder incontinence, burning urine Ext:   Denies Joint pain, stiffness or swelling Skin: Denies  skin rash, easy bruising or bleeding or hives Endoc:  Denies polyuria, polydipsia , polyphagia or weight change Psych:   Denies depression, insomnia or hallucinations  Other:  All other systems negative  VITAL SIGNS: BP 138/66 (BP Location: Left Arm, Patient Position: Sitting, Cuff Size: Normal)   Pulse 80   Temp 97.9 F (36.6 C) (Oral)   Ht 5\' 7"  (1.702 m)   Wt 194 lb (88 kg)   LMP 09/20/2016 (Approximate)   SpO2 97%   BMI 30.38 kg/m    Physical Examination:   General Appearance: No distress  EYES PERRLA, EOM intact.   NECK Supple, No JVD Pulmonary: normal breath sounds, No wheezing.  CardiovascularNormal S1,S2.  No m/r/g.   Abdomen: Benign, Soft, non-tender. Skin:   warm, no rashes, no ecchymosis  Extremities: normal, no cyanosis, clubbing. Neuro:without focal findings,  speech normal  PSYCHIATRIC: Mood, affect within normal limits.   ASSESSMENT AND PLAN  OSA I suspect that OSA is likely present due to clinical presentation. Discussed the consequences of untreated sleep apnea. Advised not to drive drowsy for safety of patient and others. Will complete further evaluation with a home sleep study and follow up to review results.    Hypothyroidism Stable, on current management. Following with PCP.   Obesity Counseled patient on diet and lifestyle modification.    MEDICATION  ADJUSTMENTS/LABS AND TESTS ORDERED: Recommend Sleep Study   Patient  satisfied with Plan of action and management. All questions answered  Follow up to review HST results and treatment plan.   I spent a total of 44 minutes reviewing chart data, face-to-face evaluation with the patient, counseling and coordination of care as detailed above.    Caylin Nass, M.D.  Sleep Medicine [facility]

## 2023-10-01 ENCOUNTER — Encounter: Payer: Self-pay | Admitting: Family Medicine

## 2023-10-01 DIAGNOSIS — M25521 Pain in right elbow: Secondary | ICD-10-CM

## 2023-10-10 ENCOUNTER — Encounter: Payer: Self-pay | Admitting: Orthopaedic Surgery

## 2023-10-10 ENCOUNTER — Ambulatory Visit: Admitting: Orthopaedic Surgery

## 2023-10-10 ENCOUNTER — Encounter (HOSPITAL_COMMUNITY): Payer: Self-pay | Admitting: Occupational Therapy

## 2023-10-10 ENCOUNTER — Ambulatory Visit (HOSPITAL_COMMUNITY): Admitting: Occupational Therapy

## 2023-10-10 DIAGNOSIS — M25522 Pain in left elbow: Secondary | ICD-10-CM

## 2023-10-10 DIAGNOSIS — M25511 Pain in right shoulder: Secondary | ICD-10-CM | POA: Diagnosis not present

## 2023-10-10 DIAGNOSIS — R29898 Other symptoms and signs involving the musculoskeletal system: Secondary | ICD-10-CM

## 2023-10-10 DIAGNOSIS — M7711 Lateral epicondylitis, right elbow: Secondary | ICD-10-CM | POA: Diagnosis not present

## 2023-10-10 DIAGNOSIS — G8929 Other chronic pain: Secondary | ICD-10-CM

## 2023-10-10 NOTE — Progress Notes (Signed)
 My right elbow hurts now.  She has developed pain of the right lateral epicondyle area.  She went to Urgent Care recently.  She has pain lifting objects with the right hand.  She has no trauma, no redness. She has had some ulnar nerve numbness at times but she keeps her elbow straight most of the time.  She has pain to resisted dorsiflexion of the right wrist, pain over the lateral right epicondyle.  ROM of elbow is full but tender.  NV intact.  She is going to OT for left elbow radial head fracture.  I will have them work with right elbow as well.  Encounter Diagnoses  Name Primary?   Chronic right shoulder pain Yes   Lateral epicondylitis, right elbow    Return in two weeks. I have told her this may take a while to resolve.  I have explained ice massage.  I have told her to also get Aspercreme, Biofreeze or Voltaren  Gel.  Call if any problem.  Precautions discussed.  Electronically Signed Pleasant Brilliant, MD 6/18/20252:36 PM

## 2023-10-10 NOTE — Patient Instructions (Signed)
 Referral has been place to [facility] they will contact you to schedule appt  Follow up with Dr. Iline Mallory in 2 weeks  Use Voltaren  gel, Biofreeze, Aspercream, etc for pain as needed.  Ice Massage 3 times a day

## 2023-10-10 NOTE — Patient Instructions (Signed)
Home Exercises Program Theraputty Exercises  Do the following exercises 1-2 times a day using your affected hand.  1. Roll putty into a ball.  2. Make into a pancake.  3. Roll putty into a roll.  4. Pinch along log with first finger and thumb.   5. Make into a ball.  6. Roll it back into a log.   7. Pinch using thumb and side of first finger.  8. Roll into a ball, then flatten into a pancake.  9. Using your fingers, make putty into a mountain.  10. Roll putty back into a ball and squeeze gently for 2-3 minutes.   

## 2023-10-10 NOTE — Therapy (Signed)
 OUTPATIENT OCCUPATIONAL THERAPY ORTHO TREATMENT REASSESSMENT AND DISCHARGE  Patient Name: Stacy [name] MRN: [id number] DOB:1961-02-15, 63 y.o., female Today's Date: 10/10/2023  OCCUPATIONAL THERAPY DISCHARGE SUMMARY  Visits from Start of Care: 3  Current functional level related to goals / functional outcomes: See below. Pt has made great progress and has met all goals. Pt is using the LUE during ADLs.    Remaining deficits: Fatigue, mild discomfort at times   Education / Equipment: HEP   Patient agrees to discharge. Patient goals were met. Patient is being discharged due to meeting the stated rehab goals..     END OF SESSION:  OT End of Session - 10/10/23 1533     Visit Number 3    Number of Visits 3    Date for OT Re-Evaluation 10/19/23    Authorization Type [employer]    Authorization Time Period no auth required    OT Start Time 1517    OT Stop Time 1540    OT Time Calculation (min) 23 min    Activity Tolerance Patient tolerated treatment well    Behavior During Therapy [facility] for tasks assessed/performed            Past Medical History:  Diagnosis Date   Abnormal uterine bleeding    Chronic leukopenia 08/17/2016   WBC = 3.5   DDD (degenerative disc disease), cervical    Dyslipidemia    Elevated serum creatinine 08/17/2016   GERD (gastroesophageal reflux disease)    History of esophageal dilatation    2013   History of ovarian cyst    Hypothyroidism    Lactose intolerance    OSA on CPAP    study done 04-08-2013   Pre-diabetes    Right thyroid  nodule    SUI (stress urinary incontinence, female)    Tonsillar exudate 08/13/2014   Wears glasses    Past Surgical History:  Procedure Laterality Date   CESAREAN SECTION  1997   Twins-1 twin del.by c/s, 1 del.vaginally   COLONOSCOPY  04/04/2011   Internal hemorrhoids/Nl colonoscopy-SLIGHTLY TORTUOUS COLON   DILATATION & CURETTAGE/HYSTEROSCOPY WITH MYOSURE N/A 08/24/2016   Procedure:  DILATATION & CURETTAGE/HYSTEROSCOPY WITH MYOSURE Possible Myosure ;  Surgeon: Greta Leatherwood, MD;  Location: [facility];  Service: Gynecology;  Laterality: N/A;   DILATION AND CURETTAGE OF UTERUS  1998   Seconday to miscarriage   ESOPHAGOGASTRODUODENOSCOPY  09/22/2011   Mild gastritis/Hiatal hernia/DYSPHAGIA DUE TO PEPTIC STRICTURE/UNCONTROLLED GERD   KNEE ARTHROSCOPY Right 2003   MALONEY DILATION  09/22/2011   Procedure: MALONEY DILATION;  Surgeon: Alyce Jubilee, MD;  [facility]: [facility];  Service: Endoscopy;  Laterality: N/A;   REPLACEMENT TOTAL KNEE Right 2022   SAVORY DILATION  09/22/2011   Procedure: SAVORY DILATION;  Surgeon: Alyce Jubilee, MD;  Location: [facility];  Service: Endoscopy;  Laterality: N/A;   Patient Active Problem List   Diagnosis Date Noted   OSA (obstructive sleep apnea) 09/04/2022   Annual visit for general adult medical examination with abnormal findings 09/04/2022   Esophageal dysphagia 09/04/2022   RUQ pain 09/04/2022   Calcific tendinitis of left shoulder 01/02/2022   Pain of left calf 07/01/2021   Arthrofibrosis of knee joint, right 12/26/2020   Mixed hyperlipidemia 12/18/2019   Sacroiliitis (HCC) 05/29/2019   Elevated serum creatinine 08/26/2018   Dyspepsia 06/03/2018   Vitamin D  deficiency 04/05/2017   Primary hypothyroidism 03/11/2015   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   Polymenorrhea 08/13/2014   Pain in  joint, ankle and foot 08/13/2014   Metabolic syndrome X 08/24/2013   Sleep disorder 02/01/2013   Prediabetes 01/30/2013   OA (osteoarthritis) of knee 10/03/2012   Cervical back pain with evidence of disc disease 09/24/2012   Hypothyroidism 09/21/2011   ABNORMAL ELECTROCARDIOGRAM 03/04/2010   Anemia 09/15/2009   Leukocytopenia 09/15/2009   Obesity (BMI 30.0-34.9) 04/24/2009   Dyslipidemia 05/23/2007   GERD 05/23/2007   PCP: Dr. Alberteen Huge REFERRING PROVIDER: Dr. Pleasant Brilliant  ONSET DATE: 08/07/23  REFERRING  DIAG: S52.125D (ICD-10-CM) - Closed nondisplaced fracture of head of left radius with routine healing, subsequent encounter   THERAPY DIAG:  Pain in left elbow  Other symptoms and signs involving the musculoskeletal system  Rationale for Evaluation and Treatment: Rehabilitation  SUBJECTIVE:   SUBJECTIVE STATEMENT: S: My left is doing ok.  PERTINENT HISTORY: Pt is a 63 y/o female s/p left radial head fracture on 08/07/23 sustained when she fell off of a ladder. Pt was originally in a posterior splint, this has been removed and pt is in a sling.   PRECAUTIONS: Other: See    WEIGHT BEARING RESTRICTIONS: Yes NWB  PAIN:  Are you having pain? No  FALLS: Has patient fallen in last 6 months? Yes. Number of falls 1   PLOF: Independent  PATIENT GOALS: To have full motion  NEXT MD VISIT: None  OBJECTIVE:  Note: Objective measures were completed at Evaluation unless otherwise noted.  HAND DOMINANCE: Right  ADLs: Pt is having difficulty with gripping anything with weight, unable to lift at this time. Pt is unable to reach top of her head and back of her shoulders. Pt is sleeping in the recliner at this time, unable to sleep in the bed due to rolling on it.   FUNCTIONAL OUTCOME MEASURES: Quick Dash: 59.09  UPPER EXTREMITY ROM:     Active ROM Left eval Left 09/24/23 Left 10/10/23  Elbow flexion 113 132 136  Elbow extension 0    Wrist flexion 32 62 69  Wrist extension 58 80 80  Wrist ulnar deviation 28    Wrist radial deviation 18    Wrist pronation 90    Wrist supination 90    (Blank rows = not tested)    UPPER EXTREMITY MMT:       Assessed on observation versus formal MMT as pt is only 4 weeks s/p fracture  MMT Left eval Left 09/24/23 Left 10/10/23  Elbow flexion 3/5 4+/5 5/5  Elbow extension 3/5 5/5   Wrist flexion 3/5 5/5   Wrist extension 3/5 5/5   Wrist ulnar deviation 3/5 5/5   Wrist radial deviation 3/5 5/5   Wrist pronation 3/5 5/5   Wrist supination  3/5 5/5   (Blank rows = not tested)  HAND FUNCTION: Grip strength: Right: 58 lbs; Left: 32 lbs, Lateral pinch: Right: 13 lbs, Left: 13 lbs, and 3 point pinch: Right: 10 lbs, Left: 5 lbs 09/24/23: Grip strength: Left: 50 lbs, Lateral pinch: Left: 13 lbs, and 3 point pinch: Left: 6 lbs 10/10/23: Grip strength: Left: 58 lbs, Lateral pinch: Left: 15 lbs, and 3 point pinch: Left: 10 lbs   TREATMENT DATE:  10/10/23 -Wrist strengthening: red theraband-wrist flexion, extension, radial/ulnar deviation, pronation, supination, 10 reps -Grip strengthening: large, medium, and small beads with gripper at 42#, horizontal position -Theraputty: rolling into ball, flatten, roll, pinch-lateral and 3 point, repeat, pinch putty into a mountain  09/24/23 -Elbow strengthening:2#- flexion/extension-bicep curl, hammer curl, pronated curl, 10 reps -Wrist strengthening: red theraband-wrist  flexion, extension, radial/ulnar deviation, pronation, supination, 10 reps -Grip strengthening: large beads with gripper at 42#, medium beads with gripper at 38#, horizontal position -Pinch strengthening: using red clothespin and 3 point pinch, pt stacking 5 towers of 3 sponges, removing and replacing in bucket using lateral pinch -Theraputty: red-flatten, using pvc to cut circles into putty, rolling, gripping, pinching-lateral and 3 point  Eval:  -Wrist A/ROM: flexion, extension, ulnar/radial deviation, forearm supination/pronation, 10 reps -Elbow: flexion, extension in 3 planes-bicep curl, hammer curl, pronated curl, 10 reps                                                                                                                               PATIENT EDUCATION: Education details: theraputty grip/pinch strengthening Person educated: Patient Education method: Explanation, Demonstration, and Handouts Education comprehension: verbalized understanding and returned demonstration  HOME EXERCISE PROGRAM: Eval: wrist and elbow  A/ROM 6/2: wrist and elbow strengthening with weight and theraband-red   GOALS: Goals reviewed with patient? Yes  SHORT TERM GOALS: Target date: 09/26/23  Pt will be provided with and educated on HEP to improve mobility and strength in LUE required for ADL completion.   Goal status: MET    LONG TERM GOALS: Target date: 10/19/23  Pt will increase LUE A/ROM by 10+ degrees to improve mobility required for reaching overhead and behind back during ADL completion.  Goal status: MET  2.  Pt will increase left grip strength by 20# and pinch strength by 5# to improve ability to grasp and maintain hold on objects during ADLs and work tasks.   Goal status: MET  3.  Pt will decrease pain in LUE to 2/10 or less to improve ability to sleep in her bed for 2+ consecutive hours.   Goal status: MET  4.  Pt will increase LUE strength to 4+/5 or greater to improve ability to lift items and perform heavy work with the LUE during housework, yardwork,etc.   Goal status: MET    ASSESSMENT:  CLINICAL IMPRESSION: Reassessment completed this session, pt has met all goals and is demonstrating excellent ROM and strength in the LUE. Pt has also improved her grip and pinch strength, demonstrating left grip equivalent to the right hand grip strength. Pt reports she is using the left hand during ADLs, does experience fatigue. Continued with wrist strengthening and grip/pinch strengthening. Added theraputty to HEP and reviewed. Pt is agreeable to discharge today.    PERFORMANCE DEFICITS: in functional skills including ADLs, IADLs, coordination, ROM, strength, pain, fascial restrictions, and UE functional use     PLAN:  OT FREQUENCY: every other week  OT DURATION: 6 weeks  PLANNED INTERVENTIONS: 97168 OT Re-evaluation, 97535 self care/ADL training, 13086 therapeutic exercise, 97530 therapeutic activity, 97112 neuromuscular re-education, 97140 manual therapy, 97035 ultrasound, 97014 electrical  stimulation unattended, patient/family education, and DME and/or AE instructions  CONSULTED AND AGREED WITH PLAN OF CARE: Patient  PLAN FOR NEXT SESSION: Discharge today  Lafonda Piety, OTR/L  [phone] 10/10/2023, 3:42 PM

## 2023-10-17 DIAGNOSIS — Z01419 Encounter for gynecological examination (general) (routine) without abnormal findings: Secondary | ICD-10-CM | POA: Diagnosis not present

## 2023-10-23 ENCOUNTER — Encounter: Payer: Self-pay | Admitting: Orthopaedic Surgery

## 2023-10-24 ENCOUNTER — Encounter (HOSPITAL_COMMUNITY): Admitting: Occupational Therapy

## 2023-10-29 ENCOUNTER — Ambulatory Visit (INDEPENDENT_AMBULATORY_CARE_PROVIDER_SITE_OTHER): Payer: Self-pay

## 2023-10-29 ENCOUNTER — Ambulatory Visit: Admitting: Orthopaedic Surgery

## 2023-10-29 ENCOUNTER — Other Ambulatory Visit: Payer: Self-pay | Admitting: *Deleted

## 2023-10-29 DIAGNOSIS — M25521 Pain in right elbow: Secondary | ICD-10-CM | POA: Diagnosis not present

## 2023-10-29 DIAGNOSIS — S52125D Nondisplaced fracture of head of left radius, subsequent encounter for closed fracture with routine healing: Secondary | ICD-10-CM

## 2023-10-29 NOTE — Progress Notes (Signed)
 XRAY only for patient

## 2023-10-31 ENCOUNTER — Ambulatory Visit (HOSPITAL_COMMUNITY): Attending: Orthopaedic Surgery | Admitting: Occupational Therapy

## 2023-10-31 ENCOUNTER — Ambulatory Visit: Admitting: Orthopaedic Surgery

## 2023-10-31 ENCOUNTER — Encounter (HOSPITAL_COMMUNITY): Payer: Self-pay | Admitting: Occupational Therapy

## 2023-10-31 ENCOUNTER — Encounter: Payer: Self-pay | Admitting: Orthopaedic Surgery

## 2023-10-31 VITALS — BP 159/88 | HR 66 | Ht 67.0 in | Wt 194.0 lb

## 2023-10-31 DIAGNOSIS — M25521 Pain in right elbow: Secondary | ICD-10-CM

## 2023-10-31 DIAGNOSIS — M25511 Pain in right shoulder: Secondary | ICD-10-CM | POA: Diagnosis not present

## 2023-10-31 DIAGNOSIS — G8929 Other chronic pain: Secondary | ICD-10-CM | POA: Insufficient documentation

## 2023-10-31 DIAGNOSIS — R29898 Other symptoms and signs involving the musculoskeletal system: Secondary | ICD-10-CM | POA: Diagnosis not present

## 2023-10-31 NOTE — Patient Instructions (Signed)
AROM Exercises   1) Wrist Flexion  Start with wrist at edge of table, palm facing up. With wrist hanging slightly off table, curl wrist upward, and back down.      2) Wrist Extension  Start with wrist at edge of table, palm facing down. With wrist slightly off the edge of the table, curl wrist up and back down.      3) Radial Deviations  Start with forearm flat against a table, wrist hanging slightly off the edge, and palm facing the wall. Bending at the wrist only, and keeping palm facing the wall, bend wrist so fist is pointing towards the floor, back up to start position, and up towards the ceiling. Return to start.        4) WRIST PRONATION  Turn your forearm towards palm face down.  Keep your elbow bent and by the side of your  Body.      5) WRIST SUPINATION  Turn your forearm towards palm face up.  Keep your elbow bent and by the side of your  Body.      *Complete exercises ______ times each, _______ times per day*      Strengthening Exercises  1) WRIST EXTENSION CURLS - TABLE  Hold a small free weight, rest your forearm on a table and bend your wrist up and down with your palm face down as shown.      2) WRIST FLEXION CURLS - TABLE  Hold a small free weight, rest your forearm on a table and bend your wrist up and down with your palm face up as shown.     3) FREE WEIGHT RADIAL/ULNAR DEVIATION - TABLE  Hold a small free weight, rest your forearm on a table and bend your wrist up and down with your palm facing towards the side as shown.     4) Pronation  Forearm supported on table with wrist in neutral position. Using a weight, roll wrist so that palm faces downward. Hold for 2 seconds and return to starting position.     5) Supination  Forearm supported on table with wrist in neutral position. Using a weight, roll wrist so that palm is now facing upward. Hold for 2 seconds and return to starting position.      *Complete  exercises using ____ pound weight, ____times each, ____times per day*  

## 2023-10-31 NOTE — Progress Notes (Signed)
 My right arm is worse.  She has more pain in the right lateral epicondyle area and some numbness of the ulnar nerve sensation on the right.  She has no redness, no new trauma.  She is going to PT for ROM of the left elbow post radial head fracture.  She is doing very well with this.  I will add the right elbow to the PT order.  I will get EMG of the upper extremities to see if she has ulnar nerve tardy.  ROM of both elbows is full.  Left arm no pain.  Right arm tender over the lateral epicondyle and some slight decreased sensation of the little finger but intrinsics intact bilaterally.  Encounter Diagnoses  Name Primary?   Pain in right elbow Yes   Chronic right shoulder pain    Return in two weeks.  Call if any problem.  Precautions discussed.  Electronically Signed Lemond Stable, MD 7/9/20252:23 PM

## 2023-10-31 NOTE — Therapy (Unsigned)
 OUTPATIENT OCCUPATIONAL THERAPY ORTHO EVALUATION  Patient Name: Stacy [name] MRN: [id number] DOB:03/04/1961, 63 y.o., female Today's Date: 10/31/2023  PCP: *** REFERRING PROVIDER: ***  END OF SESSION:   Past Medical History:  Diagnosis Date   Abnormal uterine bleeding    Chronic leukopenia 08/17/2016   WBC = 3.5   DDD (degenerative disc disease), cervical    Dyslipidemia    Elevated serum creatinine 08/17/2016   GERD (gastroesophageal reflux disease)    History of esophageal dilatation    2013   History of ovarian cyst    Hypothyroidism    Lactose intolerance    OSA on CPAP    study done 04-08-2013   Pre-diabetes    Right thyroid  nodule    SUI (stress urinary incontinence, female)    Tonsillar exudate 08/13/2014   Wears glasses    Past Surgical History:  Procedure Laterality Date   CESAREAN SECTION  1997   Twins-1 twin del.by c/s, 1 del.vaginally   COLONOSCOPY  04/04/2011   Internal hemorrhoids/Nl colonoscopy-SLIGHTLY TORTUOUS COLON   DILATATION & CURETTAGE/HYSTEROSCOPY WITH MYOSURE N/A 08/24/2016   Procedure: DILATATION & CURETTAGE/HYSTEROSCOPY WITH MYOSURE Possible Myosure ;  Surgeon: Bobie FORBES Cathlyn JAYSON Nikki, MD;  Location: [facility];  Service: Gynecology;  Laterality: N/A;   DILATION AND CURETTAGE OF UTERUS  1998   Seconday to miscarriage   ESOPHAGOGASTRODUODENOSCOPY  09/22/2011   Mild gastritis/Hiatal hernia/DYSPHAGIA DUE TO PEPTIC STRICTURE/UNCONTROLLED GERD   KNEE ARTHROSCOPY Right 2003   MALONEY DILATION  09/22/2011   Procedure: MALONEY DILATION;  Surgeon: Margo LITTIE Haddock, MD;  Location: [facility];  Service: Endoscopy;  Laterality: N/A;   REPLACEMENT TOTAL KNEE Right 2022   SAVORY DILATION  09/22/2011   Procedure: SAVORY DILATION;  Surgeon: Margo LITTIE Haddock, MD;  Location[facility];  Service: Endoscopy;  Laterality: N/A;   Patient Active Problem List   Diagnosis Date Noted   OSA (obstructive sleep apnea) 09/04/2022   Annual visit for general  adult medical examination with abnormal findings 09/04/2022   Esophageal dysphagia 09/04/2022   RUQ pain 09/04/2022   Calcific tendinitis of left shoulder 01/02/2022   Pain of left calf 07/01/2021   Arthrofibrosis of knee joint, right 12/26/2020   Mixed hyperlipidemia 12/18/2019   Sacroiliitis (HCC) 05/29/2019   Elevated serum creatinine 08/26/2018   Dyspepsia 06/03/2018   Vitamin D  deficiency 04/05/2017   Primary hypothyroidism 03/11/2015   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   Polymenorrhea 08/13/2014   Pain in joint, ankle and foot 08/13/2014   Metabolic syndrome X 08/24/2013   Sleep disorder 02/01/2013   Prediabetes 01/30/2013   OA (osteoarthritis) of knee 10/03/2012   Cervical back pain with evidence of disc disease 09/24/2012   Hypothyroidism 09/21/2011   ABNORMAL ELECTROCARDIOGRAM 03/04/2010   Anemia 09/15/2009   Leukocytopenia 09/15/2009   Obesity (BMI 30.0-34.9) 04/24/2009   Dyslipidemia 05/23/2007   GERD 05/23/2007    ONSET DATE: ***  REFERRING DIAG: ***  THERAPY DIAG:  No diagnosis found.  Rationale for Evaluation and Treatment: {HABREHAB:27488}  SUBJECTIVE:   SUBJECTIVE STATEMENT: *** Pt accompanied by: {accompnied:27141}  PERTINENT HISTORY: ***  PRECAUTIONS: {Therapy precautions:24002}   WEIGHT BEARING RESTRICTIONS: {Yes ***/No:24003}  PAIN:  Are you having pain? No  FALLS: Has patient fallen in last 6 months? Yes. Number of falls 1  PATIENT GOALS: Decrease pain and improve mobility  NEXT MD VISIT: 11/14/23  OBJECTIVE:  Note: Objective measures were completed at Evaluation unless otherwise noted.  HAND DOMINANCE: Right  ADLs: Overall ADLs: Pt  unable to lift and manipulate items with the RUE due to pain and weakness. Cooking, cleaning and yard work are affected.   FUNCTIONAL OUTCOME MEASURES: Upper Extremity Functional Scale (UEFS): 21/80  UPPER EXTREMITY ROM:     Active ROM Right eval  Elbow flexion 132  Elbow extension -4   Wrist flexion 55  Wrist extension 72  Wrist ulnar deviation 34  Wrist radial deviation 6  Wrist pronation WFL  Wrist supination WFL ** w/ pain  (Blank rows = not tested)  UPPER EXTREMITY MMT:     MMT Right eval  Elbow flexion 4+/5  Elbow extension 5/5  Wrist flexion 4/5  Wrist extension 4+/5  Wrist ulnar deviation 4+/5  Wrist radial deviation 5/5  Wrist pronation 5/5  Wrist supination 4+'/5  (Blank rows = not tested)  HAND FUNCTION: Grip strength: Right: 27 lbs; Left: 35 lbs, Lateral pinch: Right: 6 lbs, Left: 8 lbs, and 3 point pinch: Right: 12 lbs, Left: 14 lbs  SENSATION: Numbness in the pinky and 4th finger  EDEMA: No swelling noted.   OBSERVATIONS: ***   TREATMENT DATE: ***                                                                                                                            Modalities: {OPRCMODALITIES:31717}     PATIENT EDUCATION: Education details: *** Person educated: {Person educated:25204} Education method: {Education Method:25205} Education comprehension: {Education Comprehension:25206}  HOME EXERCISE PROGRAM: ***  GOALS: Goals reviewed with patient? {yes/no:20286}  SHORT TERM GOALS: Target date: ***  *** Baseline: Goal status: INITIAL  2.  *** Baseline:  Goal status: INITIAL  3.  *** Baseline:  Goal status: INITIAL  4.  *** Baseline:  Goal status: INITIAL  5.  *** Baseline:  Goal status: INITIAL  6.  *** Baseline:  Goal status: INITIAL  LONG TERM GOALS: Target date: ***  *** Baseline:  Goal status: INITIAL  2.  *** Baseline:  Goal status: INITIAL  3.  *** Baseline:  Goal status: INITIAL  4.  *** Baseline:  Goal status: INITIAL  5.  *** Baseline:  Goal status: INITIAL  6.  *** Baseline:  Goal status: INITIAL  ASSESSMENT:  CLINICAL IMPRESSION: Patient is a *** y.o. *** who was seen today for occupational therapy evaluation for ***.   PERFORMANCE DEFICITS: in functional skills  including {OT physical skills:25468}, cognitive skills including {OT cognitive skills:25469}, and psychosocial skills including {OT psychosocial skills:25470}.   IMPAIRMENTS: are limiting patient from {OT performance deficits:25471}.   COMORBIDITIES: {Comorbidities:25485} that affects occupational performance. Patient will benefit from skilled OT to address above impairments and improve overall function.  MODIFICATION OR ASSISTANCE TO COMPLETE EVALUATION: {OT modification:25474}  OT OCCUPATIONAL PROFILE AND HISTORY: {OT PROFILE AND HISTORY:25484}  CLINICAL DECISION MAKING: {OT CDM:25475}  REHAB POTENTIAL: {rehabpotential:25112}  EVALUATION COMPLEXITY: {Evaluation complexity:25115}      PLAN:  OT FREQUENCY: {rehab frequency:25116}  OT DURATION: {rehab duration:25117}  PLANNED INTERVENTIONS: {OT Interventions:25467}  RECOMMENDED OTHER SERVICES: ***  CONSULTED AND AGREED WITH PLAN OF CARE: {ENR:74513}  PLAN FOR NEXT SESSION: ***   Valentin Jillyn Nightingale, OT 10/31/2023, 3:42 PM

## 2023-11-07 ENCOUNTER — Encounter: Payer: Self-pay | Admitting: Family Medicine

## 2023-11-07 ENCOUNTER — Ambulatory Visit: Admitting: Family Medicine

## 2023-11-07 VITALS — BP 130/86 | HR 62 | Resp 18 | Ht 67.0 in | Wt 190.1 lb

## 2023-11-07 DIAGNOSIS — E782 Mixed hyperlipidemia: Secondary | ICD-10-CM | POA: Diagnosis not present

## 2023-11-07 DIAGNOSIS — D649 Anemia, unspecified: Secondary | ICD-10-CM | POA: Diagnosis not present

## 2023-11-07 DIAGNOSIS — Z0001 Encounter for general adult medical examination with abnormal findings: Secondary | ICD-10-CM

## 2023-11-07 DIAGNOSIS — E559 Vitamin D deficiency, unspecified: Secondary | ICD-10-CM | POA: Diagnosis not present

## 2023-11-07 NOTE — Patient Instructions (Addendum)
 F/U in 6 months  Please schedule mammogram at checkout  Please commit to daily exercise  PLEASE  move forward with sleep study etc important  Need RSV vaccine   Labs today lipid, cmp and EGFr, CBC, and vit D  Blood pressure is high, changing lifestyle as discussed and treating sleep apnea and losing weight NEEDED!!  Nurse pls update pap smear from [facility], thanks, had in 2024  Thanks for choosing [facility], we consider it a privelige to serve you.

## 2023-11-07 NOTE — Assessment & Plan Note (Signed)

## 2023-11-08 DIAGNOSIS — Z0001 Encounter for general adult medical examination with abnormal findings: Secondary | ICD-10-CM | POA: Diagnosis not present

## 2023-11-08 DIAGNOSIS — E782 Mixed hyperlipidemia: Secondary | ICD-10-CM | POA: Diagnosis not present

## 2023-11-08 DIAGNOSIS — D649 Anemia, unspecified: Secondary | ICD-10-CM | POA: Diagnosis not present

## 2023-11-08 DIAGNOSIS — E559 Vitamin D deficiency, unspecified: Secondary | ICD-10-CM | POA: Diagnosis not present

## 2023-11-09 ENCOUNTER — Other Ambulatory Visit: Payer: Self-pay

## 2023-11-09 ENCOUNTER — Ambulatory Visit: Payer: Self-pay | Admitting: Family Medicine

## 2023-11-09 DIAGNOSIS — R7301 Impaired fasting glucose: Secondary | ICD-10-CM

## 2023-11-09 LAB — CBC WITH DIFFERENTIAL/PLATELET
Basophils Absolute: 0 x10E3/uL (ref 0.0–0.2)
Basos: 1 %
EOS (ABSOLUTE): 0.1 x10E3/uL (ref 0.0–0.4)
Eos: 1 %
Hematocrit: 37.2 % (ref 34.0–46.6)
Hemoglobin: 12.2 g/dL (ref 11.1–15.9)
Immature Grans (Abs): 0 x10E3/uL (ref 0.0–0.1)
Immature Granulocytes: 0 %
Lymphocytes Absolute: 1.6 x10E3/uL (ref 0.7–3.1)
Lymphs: 47 %
MCH: 29.9 pg (ref 26.6–33.0)
MCHC: 32.8 g/dL (ref 31.5–35.7)
MCV: 91 fL (ref 79–97)
Monocytes Absolute: 0.3 x10E3/uL (ref 0.1–0.9)
Monocytes: 9 %
Neutrophils Absolute: 1.5 x10E3/uL (ref 1.4–7.0)
Neutrophils: 42 %
Platelets: 171 x10E3/uL (ref 150–450)
RBC: 4.08 x10E6/uL (ref 3.77–5.28)
RDW: 12.6 % (ref 11.7–15.4)
WBC: 3.5 x10E3/uL (ref 3.4–10.8)

## 2023-11-09 LAB — CMP14+EGFR
ALT: 24 IU/L (ref 0–32)
AST: 24 IU/L (ref 0–40)
Albumin: 4.3 g/dL (ref 3.9–4.9)
Alkaline Phosphatase: 85 IU/L (ref 44–121)
BUN/Creatinine Ratio: 10 — ABNORMAL LOW (ref 12–28)
BUN: 10 mg/dL (ref 8–27)
Bilirubin Total: 0.4 mg/dL (ref 0.0–1.2)
CO2: 22 mmol/L (ref 20–29)
Calcium: 9.4 mg/dL (ref 8.7–10.3)
Chloride: 104 mmol/L (ref 96–106)
Creatinine, Ser: 1.05 mg/dL — ABNORMAL HIGH (ref 0.57–1.00)
Globulin, Total: 3 g/dL (ref 1.5–4.5)
Glucose: 112 mg/dL — ABNORMAL HIGH (ref 70–99)
Potassium: 4.6 mmol/L (ref 3.5–5.2)
Sodium: 144 mmol/L (ref 134–144)
Total Protein: 7.3 g/dL (ref 6.0–8.5)
eGFR: 60 mL/min/1.73 (ref >59)

## 2023-11-09 LAB — LIPID PANEL
Chol/HDL Ratio: 4 ratio (ref 0.0–4.4)
Cholesterol, Total: 182 mg/dL (ref 100–199)
HDL: 46 mg/dL (ref >39)
LDL Chol Calc (NIH): 112 mg/dL — ABNORMAL HIGH (ref 0–99)
Triglycerides: 135 mg/dL (ref 0–149)
VLDL Cholesterol Cal: 24 mg/dL (ref 5–40)

## 2023-11-09 LAB — VITAMIN D 25 HYDROXY (VIT D DEFICIENCY, FRACTURES): Vit D, 25-Hydroxy: 43.9 ng/mL (ref 30.0–100.0)

## 2023-11-11 NOTE — Progress Notes (Signed)
    Stacy [name]     MRN: [id number]      DOB: 06/12/1960  Chief Complaint  Patient presents with   Annual Exam    Cpe     HPI: Patient is in for annual physical exam. Needs sleep apnea treated and needs to loose weight Immunization is reviewed , and  updated if needed.   PE: BP 130/86   Pulse 62   Resp 18   Ht 5' 7 (1.702 m)   Wt 190 lb 1.3 oz (86.2 kg)   LMP 09/20/2016 (Approximate)   SpO2 95%   BMI 29.77 kg/m   Pleasant  female, alert and oriented x 3, in no cardio-pulmonary distress. Afebrile. HEENT No facial trauma or asymetry. Sinuses non tender.  Extra occullar muscles intact.. External ears normal, . Neck: supple, no adenopathy,JVD or thyromegaly.No bruits.  Chest: Clear to ascultation bilaterally.No crackles or wheezes. Non tender to palpation  Breast: Not examined, mammogram UTD and  asymptomatic  Cardiovascular system; Heart sounds normal,  S1 and  S2 ,no S3.  No murmur, or thrill. Peripheral pulses normal.  Abdomen: Soft, non tender, no organomegaly or masses. No bruits. Bowel sounds normal. No guarding, tenderness or rebound.   GU: Not examined  Pap is uTD and asymptomatic  Musculoskeletal exam: Full ROM of spine, hips , shoulders and knees. No deformity ,swelling or crepitus noted. No muscle wasting or atrophy.   Neurologic: Cranial nerves 2 to 12 intact. Power, tone ,sensation and reflexes normal throughout. No disturbance in gait. No tremor.  Skin: Intact, no ulceration, erythema , scaling or rash noted. Pigmentation normal throughout  Psych; Normal mood and affect. Judgement and concentration normal   Assessment & Plan:  Annual visit for general adult medical examination with abnormal findings Annual exam as documented. Counseling done  re healthy lifestyle involving commitment to 150 minutes exercise per week, heart healthy diet, and attaining healthy weight.The importance of adequate sleep also  discussed. Regular seat belt use and home safety, is also discussed. Changes in health habits are decided on by the patient with goals and time frames  set for achieving them. Immunization and cancer screening needs are specifically addressed at this visit.

## 2023-11-12 ENCOUNTER — Telehealth: Payer: Self-pay | Admitting: Orthopaedic Surgery

## 2023-11-12 DIAGNOSIS — R200 Anesthesia of skin: Secondary | ICD-10-CM | POA: Diagnosis not present

## 2023-11-12 NOTE — Telephone Encounter (Signed)
 Dr. Janae Stacy [name] - [name] requesting a copy of her referral, to where I do not know.  I tried to call the Stacy [name], mailbox is full.  In her message, she stated that she was going to stop by the office in about 30 minutes.

## 2023-11-12 NOTE — Telephone Encounter (Signed)
 I faxerd the order over to [facility] at her request for the EMG

## 2023-11-14 ENCOUNTER — Encounter (HOSPITAL_COMMUNITY): Admitting: Occupational Therapy

## 2023-11-21 ENCOUNTER — Encounter (HOSPITAL_COMMUNITY): Admitting: Occupational Therapy

## 2023-11-21 DIAGNOSIS — R200 Anesthesia of skin: Secondary | ICD-10-CM | POA: Diagnosis not present

## 2023-11-23 ENCOUNTER — Encounter: Admitting: Physical Medicine and Rehabilitation

## 2023-11-24 ENCOUNTER — Other Ambulatory Visit: Payer: Self-pay | Admitting: "Endocrinology

## 2023-11-24 DIAGNOSIS — E039 Hypothyroidism, unspecified: Secondary | ICD-10-CM

## 2023-11-27 ENCOUNTER — Other Ambulatory Visit: Payer: Self-pay

## 2023-11-27 DIAGNOSIS — E039 Hypothyroidism, unspecified: Secondary | ICD-10-CM

## 2023-11-27 MED ORDER — LEVOTHYROXINE SODIUM 137 MCG PO TABS: 137.0000 ug | ORAL_TABLET | Freq: Every day | ORAL | 1 refills | Status: AC

## 2023-11-28 ENCOUNTER — Encounter (HOSPITAL_COMMUNITY): Admitting: Occupational Therapy

## 2023-12-05 ENCOUNTER — Ambulatory Visit: Admitting: Orthopaedic Surgery

## 2023-12-05 ENCOUNTER — Encounter: Payer: Self-pay | Admitting: Orthopaedic Surgery

## 2023-12-05 DIAGNOSIS — M25521 Pain in right elbow: Secondary | ICD-10-CM | POA: Diagnosis not present

## 2023-12-05 DIAGNOSIS — S52125D Nondisplaced fracture of head of left radius, subsequent encounter for closed fracture with routine healing: Secondary | ICD-10-CM

## 2023-12-05 NOTE — Patient Instructions (Signed)
 Physical therapy has been ordered for you at [facility]. They should call you to schedule, [phone] is the phone number to call, if you want to call to schedule.

## 2023-12-05 NOTE — Progress Notes (Signed)
 My arm is only slightly better.  She had EMGs and they were normal.  I have informed her of the findings and have reviewed the report with her.  She has less right hand pain. ROM of both elbows is good.  NV intact.  Encounter Diagnoses  Name Primary?   Pain in right elbow Yes   Closed nondisplaced fracture of head of left radius with routine healing, subsequent encounter    I will begin OT for the hands and elbows.  Return in six weeks.  Call if any problem.  Precautions discussed.  Electronically Signed Lemond Stable, MD 8/13/20253:06 PM

## 2023-12-14 ENCOUNTER — Encounter: Payer: Self-pay | Admitting: Radiology

## 2023-12-18 ENCOUNTER — Encounter

## 2023-12-18 DIAGNOSIS — G4733 Obstructive sleep apnea (adult) (pediatric): Secondary | ICD-10-CM

## 2023-12-19 ENCOUNTER — Encounter (HOSPITAL_COMMUNITY): Payer: Self-pay | Admitting: Occupational Therapy

## 2023-12-19 ENCOUNTER — Ambulatory Visit (HOSPITAL_COMMUNITY): Attending: Orthopaedic Surgery | Admitting: Occupational Therapy

## 2023-12-19 DIAGNOSIS — R29898 Other symptoms and signs involving the musculoskeletal system: Secondary | ICD-10-CM | POA: Diagnosis not present

## 2023-12-19 DIAGNOSIS — S52125D Nondisplaced fracture of head of left radius, subsequent encounter for closed fracture with routine healing: Secondary | ICD-10-CM | POA: Insufficient documentation

## 2023-12-19 DIAGNOSIS — M25521 Pain in right elbow: Secondary | ICD-10-CM | POA: Insufficient documentation

## 2023-12-19 NOTE — Therapy (Unsigned)
 OUTPATIENT OCCUPATIONAL THERAPY ORTHO REASSESSMENT DISCHARGE NOTE  Patient Name: Stacy [name] MRN: [id number] DOB:1960/12/16, 63 y.o., female Today's Date: 12/20/2023  PCP: Dr. Rollene Pesa REFERRING PROVIDER: Dr. Lemond Stable  OCCUPATIONAL THERAPY DISCHARGE SUMMARY  Visits from Start of Care: 2  Current functional level related to goals / functional outcomes: All OT goals have been met. Pt's strength, grip, and pinch are all WFL, with good coordination.    Remaining deficits: Pt's detailed fine motor skills continues to be affected during her work.    Education / Equipment: Pt provided comprehensive HEP.    Plan: Patient agrees to discharge as all OT goals have been met.       END OF SESSION:  OT End of Session - 12/19/23 1533     Visit Number 2    Number of Visits 4    Date for OT Re-Evaluation 11/30/23    Authorization Type [employer]    Authorization Time Period no auth required    OT Start Time 1455    OT Stop Time 1533    OT Time Calculation (min) 38 min    Activity Tolerance Patient tolerated treatment well    Behavior During Therapy [facility] for tasks assessed/performed           Past Medical History:  Diagnosis Date   Abnormal uterine bleeding    Chronic leukopenia 08/17/2016   WBC = 3.5   DDD (degenerative disc disease), cervical    Dyslipidemia    Elevated serum creatinine 08/17/2016   GERD (gastroesophageal reflux disease)    History of esophageal dilatation    2013   History of ovarian cyst    Hypothyroidism    Lactose intolerance    OSA on CPAP    study done 04-08-2013   Pre-diabetes    Right thyroid  nodule    SUI (stress urinary incontinence, female)    Tonsillar exudate 08/13/2014   Wears glasses    Past Surgical History:  Procedure Laterality Date   CESAREAN SECTION  1997   Twins-1 twin del.by c/s, 1 del.vaginally   COLONOSCOPY  04/04/2011   Internal hemorrhoids/Nl colonoscopy-SLIGHTLY TORTUOUS COLON    DILATATION & CURETTAGE/HYSTEROSCOPY WITH MYOSURE N/A 08/24/2016   Procedure: DILATATION & CURETTAGE/HYSTEROSCOPY WITH MYOSURE Possible Myosure ;  Surgeon: Bobie FORBES Cathlyn JAYSON Nikki, MD;  Location: [facility];  Service: Gynecology;  Laterality: N/A;   DILATION AND CURETTAGE OF UTERUS  1998   Seconday to miscarriage   ESOPHAGOGASTRODUODENOSCOPY  09/22/2011   Mild gastritis/Hiatal hernia/DYSPHAGIA DUE TO PEPTIC STRICTURE/UNCONTROLLED GERD   KNEE ARTHROSCOPY Right 2003   MALONEY DILATION  09/22/2011   Procedure: MALONEY DILATION;  Surgeon: Margo LITTIE Haddock, MD;  [facility];  Service: Endoscopy;  Laterality: N/A;   REPLACEMENT TOTAL KNEE Right 2022   SAVORY DILATION  09/22/2011   Procedure: SAVORY DILATION;  Surgeon: Margo LITTIE Haddock, MD;  Location: [facility];  Service: Endoscopy;  Laterality: N/A;   Patient Active Problem List   Diagnosis Date Noted   OSA (obstructive sleep apnea) 09/04/2022   Annual visit for general adult medical examination with abnormal findings 09/04/2022   Esophageal dysphagia 09/04/2022   RUQ pain 09/04/2022   Calcific tendinitis of left shoulder 01/02/2022   Pain of left calf 07/01/2021   Arthrofibrosis of knee joint, right 12/26/2020   Mixed hyperlipidemia 12/18/2019   Sacroiliitis (HCC) 05/29/2019   Elevated serum creatinine 08/26/2018   Dyspepsia 06/03/2018   Vitamin D  deficiency 04/05/2017   Primary hypothyroidism 03/11/2015  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   Polymenorrhea 08/13/2014   Pain in joint, ankle and foot 08/13/2014   Metabolic syndrome X 08/24/2013   Sleep disorder 02/01/2013   Prediabetes 01/30/2013   OA (osteoarthritis) of knee 10/03/2012   Cervical back pain with evidence of disc disease 09/24/2012   Hypothyroidism 09/21/2011   ABNORMAL ELECTROCARDIOGRAM 03/04/2010   Anemia 09/15/2009   Leukocytopenia 09/15/2009   Obesity (BMI 30.0-34.9) 04/24/2009   Dyslipidemia 05/23/2007   GERD 05/23/2007    ONSET DATE:  08/07/23  REFERRING DIAG: Right Lateral Epicondylitis  THERAPY DIAG:  Pain in right elbow  Other symptoms and signs involving the musculoskeletal system  Rationale for Evaluation and Treatment: Rehabilitation  SUBJECTIVE:   SUBJECTIVE STATEMENT: My hand is not doing well Pt accompanied by: self  PERTINENT HISTORY: Pt fell off a ladder on 08/07/23. Fx L radial head and has continuously experienced pain in the R elbow with negative x-rays. Pt finished OT for LUE on 10/10/23.   PRECAUTIONS: None   WEIGHT BEARING RESTRICTIONS: No  PAIN:  Are you having pain? No  FALLS: Has patient fallen in last 6 months? Yes. Number of falls 1  PATIENT GOALS: Decrease pain and improve mobility  NEXT MD VISIT: 11/14/23  OBJECTIVE:  Note: Objective measures were completed at Evaluation unless otherwise noted.  HAND DOMINANCE: Right  ADLs: Overall ADLs: Pt unable to lift and manipulate items with the RUE due to pain and weakness. Cooking, cleaning and yard work are affected.   FUNCTIONAL OUTCOME MEASURES: Upper Extremity Functional Scale (UEFS): 21/80  UPPER EXTREMITY ROM:     Active ROM Right eval Right 12/19/23  Elbow flexion 132 132  Elbow extension -4 9  Wrist flexion 55 66  Wrist extension 72 75  Wrist ulnar deviation 34 52  Wrist radial deviation 6 14  Wrist pronation A [place] WFL  Wrist supination WFL ** w/ pain WFL  (Blank rows = not tested)  UPPER EXTREMITY MMT:     MMT Right eval Right 12/19/23  Elbow flexion 4+/5 5/5  Elbow extension 5/5 5/5  Wrist flexion 4/5 4+/5  Wrist extension 4+/5 5/5  Wrist ulnar deviation 4+/5 5/5  Wrist radial deviation 5/5 5/5  Wrist pronation 5/5 5/5  Wrist supination 4+'/5 5/5  (Blank rows = not tested)  HAND FUNCTION: Grip strength: Right: 27 lbs; Left: 35 lbs, Lateral pinch: Right: 6 lbs, Left: 8 lbs, and 3 point pinch: Right: 12 lbs, Left: 14 lbs 12/19/23: Grip strength: Right: 55 lbs; Left: 56 lbs, Lateral pinch: Right: 13 lbs,  Left: 13 lbs, and 3 point pinch: Right: 10 lbs, Left: 10 lbs  COORDINATION:   9 Hole Peg test: Right: 21.90 sec; Left: 23.52 sec  SENSATION: Numbness in the pinky and 4th finger  EDEMA: No swelling noted.   OBSERVATIONS: Moderate fascial restrictions along the L forearm.   TREATMENT DATE:   12/20/23 -Digit ROM: composite flexion, abduction, finger taps, opposition, x10 -Wrist ROM: extension, flexion, ulnar/radial deviation, supination/pronation, x10  -Discussion on elbow brace wear schedule both during sleep and during work.  -9 hole peg test -threading tiny beads through string -provided built up handle to improve detailed work with equipment at work -discussed other detailed fine motor task to be practiced at home, such as needle work, crocheting   10/31/23 -Wrist ROM: extension, flexion, ulnar/radial deviation, supination/pronation, x10   -Theraputty: red, roll into a ball, flatten into pancake, form mountain, squeeze into ball  PATIENT EDUCATION: Education details: splint care, detailed fine motor tasks Person educated: Patient Education method: Explanation, Demonstration, and Handouts Education comprehension: verbalized understanding and returned demonstration  HOME EXERCISE PROGRAM: 7/9: Wrist ROM and Theraputty  GOALS: Goals reviewed with patient? Yes  SHORT TERM GOALS: Target date: 11/30/23  Pt will be provided with and educated on HEP to improve mobility and strength in RUE required for ADL completion.   Goal status: MET  2.  Pt will increase right grip strength by 20# and pinch strength by 5# to improve ability to grasp and maintain hold on objects during ADLs and work tasks.   Goal status: MET  3.   Pt will decrease pain in RUE to 2/10 or less to improve ability to sleep in her bed for 2+ consecutive hours.   Goal status: MET  4.   Pt will increase RUE strength to 5/5 or greater to improve ability to lift items and perform heavy work with the LUE during housework, yardwork,etc.   Goal status: MET    ASSESSMENT:  CLINICAL IMPRESSION: This session pt reports decreased fine motor skills, specifically during work tasks. She has dropped multiple items and feels like she has poor control. With reassessment she demonstrates good mobility and strength, as well as [facility] coordination. OT and pt discussed multiple detailed fine motor exercises that she can work on, as well as providing built up handles to improve her grip on her instruments. Verbal and tactile cuing provided for positioning and technique throughout session.   PERFORMANCE DEFICITS: in functional skills including ADLs, IADLs, sensation, edema, ROM, strength, pain, fascial restrictions, Fine motor control, body mechanics, and UE functional use.    PLAN:  OT FREQUENCY: 1x/week  OT DURATION: 3 weeks  PLANNED INTERVENTIONS: 97168 OT Re-evaluation, 97535 self care/ADL training, 02889 therapeutic exercise, 97530 therapeutic activity, 97112 neuromuscular re-education, 97140 manual therapy, 97035 ultrasound, 97010 moist heat, 97032 electrical stimulation (manual), passive range of motion, functional mobility training, energy conservation, coping strategies training, patient/family education, and DME and/or AE instructions  RECOMMENDED OTHER SERVICES: N/A  CONSULTED AND AGREED WITH PLAN OF CARE: Patient  PLAN FOR NEXT SESSION: Discharge   Valentin Nightingale, OTR/L [facility] outpatient Rehab [phone] Valentin Jillyn Nightingale, OT 12/20/2023, 9:46 AM

## 2023-12-28 ENCOUNTER — Ambulatory Visit: Admitting: Sleep Medicine

## 2024-01-03 DIAGNOSIS — R069 Unspecified abnormalities of breathing: Secondary | ICD-10-CM | POA: Diagnosis not present

## 2024-01-04 ENCOUNTER — Ambulatory Visit: Payer: Self-pay

## 2024-01-04 DIAGNOSIS — G4733 Obstructive sleep apnea (adult) (pediatric): Secondary | ICD-10-CM

## 2024-01-07 ENCOUNTER — Telehealth: Payer: Self-pay | Admitting: Sleep Medicine

## 2024-01-07 NOTE — Telephone Encounter (Signed)
 LVMTCB to reschedule appointment to 03/08/2024 to 05/08/2024.

## 2024-01-16 ENCOUNTER — Ambulatory Visit (INDEPENDENT_AMBULATORY_CARE_PROVIDER_SITE_OTHER): Admitting: Orthopaedic Surgery

## 2024-01-16 ENCOUNTER — Encounter: Payer: Self-pay | Admitting: Orthopaedic Surgery

## 2024-01-16 VITALS — BP 134/68 | HR 80 | Ht 67.0 in | Wt 190.0 lb

## 2024-01-16 DIAGNOSIS — M25521 Pain in right elbow: Secondary | ICD-10-CM | POA: Diagnosis not present

## 2024-01-16 DIAGNOSIS — M79641 Pain in right hand: Secondary | ICD-10-CM

## 2024-01-16 NOTE — Progress Notes (Signed)
 My right hand still hurts.  There was a scheduling mix-up with OT at [facility]. They said she was for the shoulder and they did not do hands or elbows.  I do not understand what happened.  I will set her up for this in the main [place] office in [place].  She has some right hand pain at times.  She has good ROM of the right elbow.  NV intact.  Encounter Diagnoses  Name Primary?   Pain in right elbow Yes   Pain in right hand    To hand therapy in [place] office.  I have informed the patient I will be retiring from medical practice and from this office on January 24, 2024.  The patient has been offered continuing care with Dr. Margrette or Dr. Onesimo of this office.  The patient may choose another provider and the records will be forwarded after proper signature and notification.  Patient understands and agrees.  She will see Dr. Margrette here in about six weeks.  Call if any problem.  Precautions discussed.  Electronically Signed Lemond Stable, MD 9/24/20252:46 PM

## 2024-01-23 ENCOUNTER — Ambulatory Visit: Admitting: Sleep Medicine

## 2024-02-05 NOTE — Therapy (Incomplete)
 OUTPATIENT OCCUPATIONAL THERAPY ORTHO EVALUATION  Patient Name: Stacy [name] MRN: [id number] DOB:11/15/1960, 63 y.o., female Today's Date: 02/05/2024  PCP: Antonetta BATTLE MD REFERRING PROVIDER: Brenna Lin, MD   END OF SESSION:   Past Medical History:  Diagnosis Date   Abnormal uterine bleeding    Chronic leukopenia 08/17/2016   WBC = 3.5   DDD (degenerative disc disease), cervical    Dyslipidemia    Elevated serum creatinine 08/17/2016   GERD (gastroesophageal reflux disease)    History of esophageal dilatation    2013   History of ovarian cyst    Hypothyroidism    Lactose intolerance    OSA on CPAP    study done 04-08-2013   Pre-diabetes    Right thyroid  nodule    SUI (stress urinary incontinence, female)    Tonsillar exudate 08/13/2014   Wears glasses    Past Surgical History:  Procedure Laterality Date   CESAREAN SECTION  1997   Twins-1 twin del.by c/s, 1 del.vaginally   COLONOSCOPY  04/04/2011   Internal hemorrhoids/Nl colonoscopy-SLIGHTLY TORTUOUS COLON   DILATATION & CURETTAGE/HYSTEROSCOPY WITH MYOSURE N/A 08/24/2016   Procedure: DILATATION & CURETTAGE/HYSTEROSCOPY WITH MYOSURE Possible Myosure ;  Surgeon: Bobie FORBES Cathlyn JAYSON Nikki, MD;  Location: [facility];  Service: Gynecology;  Laterality: N/A;   DILATION AND CURETTAGE OF UTERUS  1998   Seconday to miscarriage   ESOPHAGOGASTRODUODENOSCOPY  09/22/2011   Mild gastritis/Hiatal hernia/DYSPHAGIA DUE TO PEPTIC STRICTURE/UNCONTROLLED GERD   KNEE ARTHROSCOPY Right 2003   MALONEY DILATION  09/22/2011   Procedure: MALONEY DILATION;  Surgeon: Margo LITTIE Haddock, MD;  [facility];  Service: Endoscopy;  Laterality: N/A;   REPLACEMENT TOTAL KNEE Right 2022   SAVORY DILATION  09/22/2011   Procedure: SAVORY DILATION;  Surgeon: Margo LITTIE Haddock, MD;  [facility];  Service: Endoscopy;  Laterality: N/A;   Patient Active Problem List   Diagnosis Date Noted   OSA (obstructive sleep apnea) 09/04/2022    Annual visit for general adult medical examination with abnormal findings 09/04/2022   Esophageal dysphagia 09/04/2022   RUQ pain 09/04/2022   Calcific tendinitis of left shoulder 01/02/2022   Pain of left calf 07/01/2021   Arthrofibrosis of knee joint, right 12/26/2020   Mixed hyperlipidemia 12/18/2019   Sacroiliitis 05/29/2019   Elevated serum creatinine 08/26/2018   Dyspepsia 06/03/2018   Vitamin D  deficiency 04/05/2017   Primary hypothyroidism 03/11/2015   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   Polymenorrhea 08/13/2014   Pain in joint, ankle and foot 08/13/2014   Metabolic syndrome X 08/24/2013   Sleep disorder 02/01/2013   Prediabetes 01/30/2013   OA (osteoarthritis) of knee 10/03/2012   Cervical back pain with evidence of disc disease 09/24/2012   Hypothyroidism 09/21/2011   ABNORMAL ELECTROCARDIOGRAM 03/04/2010   Anemia 09/15/2009   Leukocytopenia 09/15/2009   Obesity (BMI 30.0-34.9) 04/24/2009   Dyslipidemia 05/23/2007   GERD 05/23/2007    ONSET DATE: DOI 08/07/23  REFERRING DIAG: M79.641, M25.521  THERAPY DIAG:  No diagnosis found.  Rationale for Evaluation and Treatment: Rehabilitation  SUBJECTIVE:   SUBJECTIVE STATEMENT: She states ***.    Pt accompanied by: {accompnied:27141}  PERTINENT HISTORY: She attended to outpatient therapy sessions at [facility], but apparently they found her within functional limits and discharged somewhat unsuccessfully with her having continued reports of pain and coordination issues.    PRECAUTIONS: {Therapy precautions:24002}  RED FLAGS: {PT Red Flags:29287}   WEIGHT BEARING RESTRICTIONS: {Yes ***/No:24003}  PAIN:  Are you having pain?  Yes: NPRS scale: *** Pain location: *** Pain description: *** Aggravating factors: *** Relieving factors: ***  FALLS: Has patient fallen in last 6 months? {fallsyesno:27318}  LIVING ENVIRONMENT: Lives with: {OPRC lives with:25569::lives with their family} Lives  in: {Lives in:25570} Stairs: {opstairs:27293} Has following equipment at home: {Assistive devices:23999}  PLOF: {PLOF:24004}  PATIENT GOALS: ***  NEXT MD VISIT: ***    OBJECTIVE: (All objective assessments below are from initial evaluation on: 02/06/24 unless otherwise specified.)   HAND DOMINANCE: Right ***  ADLs: Overall ADLs: States decreased ability to grab, hold household objects, pain and difficulty to open containers, perform FMS tasks (manipulate fasteners on clothing), mild to moderate bathing problems as well. ***   FUNCTIONAL OUTCOME MEASURES: Eval: Patient Specific Functional Scale: *** (***, ***, ***)  (Higher Score  =  Better Ability for the Selected Tasks)      UPPER EXTREMITY ROM     Shoulder to Wrist AROM Right eval Left eval  Elbow flexion    Elbow extension    Forearm supination    Forearm pronation     Wrist flexion    Wrist extension    Wrist ulnar deviation    Wrist radial deviation    Functional dart thrower's motion (F-DTM) in ulnar flexion    F-DTM in radial extension     (Blank rows = not tested)   Hand AROM Right eval Left eval  Full Fist Ability (or Gap to Distal Palmar Crease)    Thumb Opposition  (Kapandji Scale)     Thumb MCP (0-60)    Thumb IP (0-80)    Thumb Radial Abduction Span     Thumb Palmar Abduction Span     Index MCP (0-90)     Index PIP (0-100)     Index DIP (0-70)      Long MCP (0-90)      Long PIP (0-100)      Long DIP (0-70)      Ring MCP (0-90)      Ring PIP (0-100)      Ring DIP (0-70)      Little MCP (0-90)      Little PIP (0-100)      Little DIP (0-70)      (Blank rows = not tested)   UPPER EXTREMITY MMT:     MMT Right 02/06/24 Left TBD  Shoulder flexion    Shoulder abduction    Shoulder adduction    Shoulder extension    Shoulder internal rotation    Shoulder external rotation    Middle trapezius    Lower trapezius    Elbow flexion    Elbow extension    Forearm supination    Forearm  pronation    Wrist flexion    Wrist extension    Wrist ulnar deviation    Wrist radial deviation    (Blank rows = not tested)  HAND FUNCTION: Eval: Observed weakness in affected *** hand.  Grip strength Right: *** lbs, Left: *** lbs   COORDINATION: Eval: Observed coordination impairments with affected *** hand. Box and Blocks Test: *** Blocks today (*** is [facility]); 9 Hole Peg Test Right: ***sec, Left: *** sec (*** sec is WFL)   SENSATION: Eval:  Light touch intact today,   *** though diminished around sx area    EDEMA:   Eval: *** Mildly swollen in *** hand and wrist today, ***cm circumferentially around ***  COGNITION: Eval: Overall cognitive status: WFL for evaluation today ***  OBSERVATIONS:  Eval: ***   TODAY'S TREATMENT:  Post-evaluation treatment: ***     PATIENT EDUCATION: Education details: See tx section above for details  Person educated: Patient Education method: Verbal Instruction, Teach back, Handouts  Education comprehension: States and demonstrates understanding, Additional Education required    HOME EXERCISE PROGRAM: See tx section above for details    GOALS: Goals reviewed with patient? Yes   SHORT TERM GOALS: (STG required if POC>30 days) Target Date: ***  Pt will obtain protective, custom orthotic. Goal status: TBD/PRN,  MET ***  2.  Pt will demo/state understanding of initial HEP to improve pain levels and prerequisite motion. Goal status: INITIAL   LONG TERM GOALS: Target Date: ***  Pt will improve functional ability by decreased impairment per PSFS assessment from *** to *** or better, for better quality of life. Goal status: INITIAL  2.  Pt will improve grip strength in *** hand from ***lbs to at least ***lbs for functional use at home and in IADLs. Goal status: INITIAL  3.  Pt will improve A/ROM in *** from *** to at least ***, to have functional motion for tasks like reach and grasp.  Goal status: INITIAL  4.  Pt will  improve strength in *** from *** MMT to at least *** MMT to have increased functional ability to carry out selfcare and higher-level homecare tasks with less difficulty. Goal status: INITIAL  5.  Pt will improve coordination skills in ***, as seen by within functional limit score on *** testing to have increased functional ability to carry out fine motor tasks (fasteners, etc.) and more complex, coordinated IADLs (meal prep, sports, etc.).  Goal status: INITIAL  6.  Pt will decrease pain at worst from ***/10 to ***/10 or better to have better sleep and occupational participation in daily roles. Goal status: INITIAL   ASSESSMENT:  CLINICAL IMPRESSION: Patient is a 63 y.o. female who was seen today for occupational therapy evaluation for ***.  The patient will benefit from outpatient occupational therapy to decrease symptoms, improve functional upper extremity use, and increase quality of life.  PERFORMANCE DEFICITS: in functional skills including {OT physical skills:25468}, cognitive skills including problem solving and safety awareness, and psychosocial skills including coping strategies, environmental adaptation, habits, and routines and behaviors.   IMPAIRMENTS: are limiting patient from ADLs, IADLs, rest and sleep, and leisure.   COMORBIDITIES: {Comorbidities:25485} that affects occupational performance. Patient will benefit from skilled OT to address above impairments and improve overall function.  MODIFICATION OR ASSISTANCE TO COMPLETE EVALUATION: {OT modification:25474}  OT OCCUPATIONAL PROFILE AND HISTORY: {OT PROFILE AND HISTORY:25484}  CLINICAL DECISION MAKING: {OT CDM:25475}  REHAB POTENTIAL: {rehabpotential:25112}  EVALUATION COMPLEXITY: {Evaluation complexity:25115}      PLAN:  OT FREQUENCY: 1-2x/week  OT DURATION: *** weeks through *** and up to *** total visits as needed   PLANNED INTERVENTIONS: 97535 self care/ADL training, 02889 therapeutic exercise, 97530  therapeutic activity, 97112 neuromuscular re-education, 97140 manual therapy, 97035 ultrasound, [id number] electrical stimulation (manual), [id number] Orthotic Initial, [id number] Orthotic/Prosthetic subsequent, compression bandaging, Dry needling, energy conservation, coping strategies training, and patient/family education  RECOMMENDED OTHER SERVICES: none now  ***  CONSULTED AND AGREED WITH PLAN OF CARE: Patient  PLAN FOR NEXT SESSION:   Review initial HEP and recommendations ***   Melvenia Ada, OTR/L, CHT  02/05/2024, 6:28 PM

## 2024-02-06 ENCOUNTER — Encounter: Admitting: Rehabilitative and Restorative Service Providers"

## 2024-02-13 ENCOUNTER — Ambulatory Visit: Admitting: Sleep Medicine

## 2024-02-25 ENCOUNTER — Encounter: Payer: Self-pay | Admitting: Radiology

## 2024-02-26 NOTE — Therapy (Signed)
 OUTPATIENT OCCUPATIONAL THERAPY ORTHO EVALUATION  Patient Name: Stacy [name] MRN: [id number] DOB:[birth year], 63 y.o., female Today's Date: 02/27/2024  PCP: Antonetta BATTLE MD REFERRING PROVIDER: Brenna Lin, MD   END OF SESSION:  OT End of Session - 02/27/24 1520     Visit Number 1    Number of Visits 7    Date for Recertification  04/11/24    Authorization Type [employer]    OT Start Time 1520    OT Stop Time 1603    OT Time Calculation (min) 43 min    Activity Tolerance Patient tolerated treatment well;No increased pain;Patient limited by fatigue;Patient limited by pain    Behavior During Therapy [facility] for tasks assessed/performed          Past Medical History:  Diagnosis Date   Abnormal uterine bleeding    Chronic leukopenia 08/17/2016   WBC = 3.5   DDD (degenerative disc disease), cervical    Dyslipidemia    Elevated serum creatinine 08/17/2016   GERD (gastroesophageal reflux disease)    History of esophageal dilatation    2013   History of ovarian cyst    Hypothyroidism    Lactose intolerance    OSA on CPAP    study done 04-08-2013   Pre-diabetes    Right thyroid  nodule    SUI (stress urinary incontinence, female)    Tonsillar exudate 08/13/2014   Wears glasses    Past Surgical History:  Procedure Laterality Date   CESAREAN SECTION  1997   Twins-1 twin del.by c/s, 1 del.vaginally   COLONOSCOPY  04/04/2011   Internal hemorrhoids/Nl colonoscopy-SLIGHTLY TORTUOUS COLON   DILATATION & CURETTAGE/HYSTEROSCOPY WITH MYOSURE N/A 08/24/2016   Procedure: DILATATION & CURETTAGE/HYSTEROSCOPY WITH MYOSURE Possible Myosure ;  Surgeon: Bobie FORBES Cathlyn JAYSON Nikki, MD;  Location: [facility];  Service: Gynecology;  Laterality: N/A;   DILATION AND CURETTAGE OF UTERUS  1998   Seconday to miscarriage   ESOPHAGOGASTRODUODENOSCOPY  09/22/2011   Mild gastritis/Hiatal hernia/DYSPHAGIA DUE TO PEPTIC STRICTURE/UNCONTROLLED GERD   KNEE ARTHROSCOPY Right 2003   MALONEY DILATION   09/22/2011   Procedure: MALONEY DILATION;  Surgeon: Margo LITTIE Haddock, MD;  Location: [facility];  Service: Endoscopy;  Laterality: N/A;   REPLACEMENT TOTAL KNEE Right 2022   SAVORY DILATION  09/22/2011   Procedure: SAVORY DILATION;  Surgeon: Margo LITTIE Haddock, MD;  Location: [facility];  Service: Endoscopy;  Laterality: N/A;   Patient Active Problem List   Diagnosis Date Noted   OSA (obstructive sleep apnea) 09/04/2022   Annual visit for general adult medical examination with abnormal findings 09/04/2022   Esophageal dysphagia 09/04/2022   RUQ pain 09/04/2022   Calcific tendinitis of left shoulder 01/02/2022   Pain of left calf 07/01/2021   Arthrofibrosis of knee joint, right 12/26/2020   Mixed hyperlipidemia 12/18/2019   Sacroiliitis 05/29/2019   Elevated serum creatinine 08/26/2018   Dyspepsia 06/03/2018   Vitamin D  deficiency 04/05/2017   Primary hypothyroidism 03/11/2015   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   Polymenorrhea 08/13/2014   Pain in joint, ankle and foot 08/13/2014   Metabolic syndrome X 08/24/2013   Sleep disorder 02/01/2013   Prediabetes 01/30/2013   OA (osteoarthritis) of knee 10/03/2012   Cervical back pain with evidence of disc disease 09/24/2012   Hypothyroidism 09/21/2011   ABNORMAL ELECTROCARDIOGRAM 03/04/2010   Anemia 09/15/2009   Leukocytopenia 09/15/2009   Obesity (BMI 30.0-34.9) 04/24/2009   Dyslipidemia 05/23/2007   GERD 05/23/2007    ONSET DATE: DOI 08/07/23  REFERRING DIAG:  M79.641, M25.521  THERAPY DIAG:  Pain in right elbow  Muscle weakness (generalized)  Paresthesia of skin  Rationale for Evaluation and Treatment: Rehabilitation  SUBJECTIVE:   SUBJECTIVE STATEMENT: She experienced a fall in April 2025 hitting both elbows, breaking the Left radial head, had therapy afterwards for the Lt elbow and arm which does feel better now.   She was having nerve pain and tingling to Rt hand Small Fingers, but this hhas gotten much better.    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     PERTINENT HISTORY: She attended to outpatient therapy sessions at [facility], but apparently they found her within functional limits and discharged somewhat unsuccessfully with her having continued reports of pain and coordination issues.    PRECAUTIONS: None  RED FLAGS: None   WEIGHT BEARING RESTRICTIONS: No  PAIN:  Are you having pain? Yes: NPRS scale: Mild at rest but up to moderate in the past week at worst Pain location: Rt lateral elbow  Pain description: Aching and sore Aggravating factors: Lifting with palm facing down Relieving factors: Rest  FALLS: Has patient fallen in last 6 months? Yes. Number of falls 1-as described above in the subjective.  Not considered a fall risk  PLOF: Independent  PATIENT GOALS: To improve pain and use with the right hand and arm for dentistry  NEXT MD VISIT: TBD    OBJECTIVE: (All objective assessments below are from initial evaluation on: 02/26/24 unless otherwise specified.)   HAND DOMINANCE: Right   ADLs: Overall ADLs: States decreased ability to grab, hold household objects, pain and difficulty to open containers, perform FMS tasks (manipulate fasteners on clothing), difficulty with work tasks   FUNCTIONAL OUTCOME MEASURES: Eval: Patient Specific Functional Scale: 4.8 (Open bottles, grip tightly, pick up items with force, extract teeth with force, hold handpiece steady and prepped teeth)  (Higher Score  =  Better Ability for the Selected Tasks)      UPPER EXTREMITY ROM     Shoulder to Wrist AROM Right eval Left eval  Elbow flexion    Elbow extension    Forearm supination    Forearm pronation     Wrist flexion 63 64  Wrist extension 51 64  Wrist ulnar deviation    Wrist radial deviation    Functional dart thrower's motion (F-DTM) in ulnar flexion    F-DTM in radial extension     (Blank rows = not tested)   Hand AROM Right eval  Full Fist  Ability (or Gap to Distal Palmar Crease) Full fist  Thumb Opposition  (Kapandji Scale)  Within normal limits  (Blank rows = not tested)   UPPER EXTREMITY MMT:     MMT Right 02/06/24  Shoulder flexion   Shoulder abduction   Shoulder adduction   Shoulder extension   Shoulder internal rotation   Shoulder external rotation   Middle trapezius   Lower trapezius   Elbow flexion   Elbow extension   Forearm supination   Forearm pronation   Wrist flexion 5/5  Wrist extension Painful 4-/5  Wrist ulnar deviation   Wrist radial deviation   (Blank rows = not tested)  HAND FUNCTION: Eval: Observed weakness in affected Rt hand.  Grip strength Right: tender 39.5 lbs, Left: 47.7 lbs  Rt 3JC pinch 9# tender; Lt:10#     COORDINATION: Eval: No significant coordination deficits noted though she has some complaints.  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  She was also educated to use moist heat and self massage every day before doing the following home exercise program 3-4 times a day in a nonpainful fashion.  Each 1 was done with her today and she states  understanding feeling good healthy stretches.   Exercises - Triceps Stretch- Do on back of chair   - 3-4 x daily - 3-5 reps - 15 hold - Forearm Pronation Stretch  - 3-4 x daily - 3-5 reps - 15 sec hold - Seated Wrist Flexion Stretch  - 3-4 x daily - 3 reps - 15 second  hold - Ulnar Nerve Flossing  - 3-4 x daily - 1-2 sets - 5-10 reps  She was also given the following and hand manipulation skills to try to perform to improve her coordination skills, per her request.  In-Hand Manipulation Skills Rotation:  Hold pen, try to "twirl" like a baton, keeping parallel (or flat) with surface of table. Try going BOTH directions 10x  Flip:  Hold pen in writing position,  flip in an arch to "erase" position, then back to "write" position. Do not lift hand off table.  10x  Translation:  Open hand palm up,  put an object in your palm and then use your fingers and thumb to move it to the tips of your fingers, pinched against your thumb. (bigger is easier (fat marker), smaller is harder (penny)) 10x  Shift:  Hold pen like a dart, start "shifting" it forward & backwards from tip to base (like putting a key in a key hole) 10x      PATIENT EDUCATION: Education details: See tx section above for details  Person educated: Patient Education method: Verbal Instruction, Teach back, Handouts  Education comprehension: States and demonstrates understanding, Additional Education required    HOME EXERCISE PROGRAM: Access Code: [email or web address] URL: [email or web address] Date: 02/27/2024 Prepared by: Melvenia Ada   GOALS: Goals reviewed with patient? Yes   SHORT TERM GOALS: (STG required if POC>30 days) Target Date: 03/14/2024  Pt will obtain protective, custom orthotic. Goal status: TBD/PRN  2.  Pt will demo/state understanding of initial HEP to improve pain levels and prerequisite motion. Goal status: INITIAL   LONG TERM GOALS: Target Date: 04/11/2024  Pt will improve functional  ability by decreased impairment per PSFS assessment from 4.8 to 6.8 or better, for better quality of life. Goal status: INITIAL  2.  Pt will improve grip strength in right hand from 40 lbs to at least 45 lbs for functional use at home and in IADLs. Goal status: INITIAL  3.  Pt will improve A/ROM in right wrist extension from 51 degrees to at least 60 degrees, to  have functional motion for tasks like reach and grasp.  Goal status: INITIAL  4.  Pt will improve strength in right wrist extension from 4 -/5 MMT to at least 4+/5 MMT to have increased functional ability to carry out selfcare and higher-level homecare tasks with less difficulty. Goal status: INITIAL  5.  Pt will decrease pain at worst from moderate  to none significant  to have better sleep and occupational participation in daily roles. Goal status: INITIAL   ASSESSMENT:  CLINICAL IMPRESSION: Patient is a 63 y.o. female who was seen today for occupational therapy evaluation for weakness, pain, decreased coordination and functional ability in the right hand and arm.  The patient will benefit from outpatient occupational therapy to decrease symptoms, improve functional upper extremity use, and increase quality of life.  PERFORMANCE DEFICITS: in functional skills including IADLs, dexterity, ROM, strength, pain, fascial restrictions, Gross motor control, body mechanics, cardiopulmonary status limiting function, decreased knowledge of precautions, and UE functional use, cognitive skills including problem solving and safety awareness, and psychosocial skills including coping strategies, environmental adaptation, habits, and routines and behaviors.   IMPAIRMENTS: are limiting patient from ADLs, IADLs, rest and sleep, and leisure.   COMORBIDITIES: may have co-morbidities  that affects occupational performance. Patient will benefit from skilled OT to address above impairments and improve overall function.  MODIFICATION OR ASSISTANCE TO  COMPLETE EVALUATION: Min-Moderate modification of tasks or assist with assess necessary to complete an evaluation.  OT OCCUPATIONAL PROFILE AND HISTORY: Detailed assessment: Review of records and additional review of physical, cognitive, psychosocial history related to current functional performance.  CLINICAL DECISION MAKING: Moderate - several treatment options, min-mod task modification necessary  REHAB POTENTIAL: Excellent  EVALUATION COMPLEXITY: Moderate      PLAN:  OT FREQUENCY: 1-2x/week  OT DURATION: 6 weeks through 04/11/24 and up to 7 total visits as needed   PLANNED INTERVENTIONS: 97535 self care/ADL training, 02889 therapeutic exercise, 97530 therapeutic activity, 97112 neuromuscular re-education, 97140 manual therapy, 97035 ultrasound, 97032 electrical stimulation (manual), 97760 Orthotic Initial, [id number] Orthotic/Prosthetic subsequent, compression bandaging, Dry needling, energy conservation, coping strategies training, and patient/family education  RECOMMENDED OTHER SERVICES: none now    CONSULTED AND AGREED WITH PLAN OF CARE: Patient  PLAN FOR NEXT SESSION:   Review initial HEP and recommendations, consider dry needling, consider upgrading to isometrics when tolerated.   Melvenia Ada, OTR/L, CHT  02/27/2024, 4:51 PM

## 2024-02-27 ENCOUNTER — Ambulatory Visit: Admitting: Rehabilitative and Restorative Service Providers"

## 2024-02-27 ENCOUNTER — Encounter: Payer: Self-pay | Admitting: Rehabilitative and Restorative Service Providers"

## 2024-02-27 DIAGNOSIS — M6281 Muscle weakness (generalized): Secondary | ICD-10-CM | POA: Diagnosis not present

## 2024-02-27 DIAGNOSIS — R202 Paresthesia of skin: Secondary | ICD-10-CM

## 2024-02-27 DIAGNOSIS — M25521 Pain in right elbow: Secondary | ICD-10-CM

## 2024-02-27 NOTE — Patient Instructions (Signed)
 Management of hand/arm numbness or "paresthesia"  Peripheral nerves do heal and regenerate- IF you are good to them and change habits/routines.   How you will be successful:  Change habits/routines, prevent "kinking the hose" and any numbness Exercise program to "unstick" nerves, loosen tightness in body Protect with supportive bracing as helpful  Avoid:  Repetitive or BAD prolonged postures Avoid "kinks" at neck, shoulder, elbow, forearm and wrist!! Generally, keep hands DOWN at night. Arm above head = bad Try to keep elbows at 90* or straighter  Don't bend or extend wrists extremely  Have pillow support for head/neck in neutral position  Watch resting with palm down and wrist flexed ALL THE TIME Things that hurt or cause numbness Direct pressure on "nerve points" For example: sleeping on hands, pressure while typing, resting wrist on steering wheel, resting elbow on center console, etc.   Specifics:  Change your sleep habits! Start every day with a non-painful stretch routine and gentle nerve "gliding" assigned by your therapist.  Stretch again, twice that day.   It's smart to gently warmup your body and stretch before doing typically stressful activities (or before bed if your hands hurt/go numb in the night).  If you must do repetitive or stressful activities, or can't break your sleep habits, try some type of support (wrist braces in night, etc.). Using an extra pillow and putting your hand in the pillowcase could help, too. Don't hold your phone to long! Using handles with padding, padded gloves can help with compression or vibration. Take breaks for rest and recovery; don't "force" yourself to finish a job.   Keep hands covered and warm in the winter or cool, rainy weather.

## 2024-03-14 ENCOUNTER — Ambulatory Visit: Admitting: "Endocrinology

## 2024-03-17 NOTE — Therapy (Incomplete)
 OUTPATIENT OCCUPATIONAL THERAPY TREATMENT NOTE   Patient Name: Stacy [name] MRN: [id number] DOB:March 28, 1961, 63 y.o., female Today's Date: 03/17/2024  PCP: Antonetta BATTLE MD REFERRING PROVIDER: Brenna Lin, MD   END OF SESSION:    Past Medical History:  Diagnosis Date   Abnormal uterine bleeding    Chronic leukopenia 08/17/2016   WBC = 3.5   DDD (degenerative disc disease), cervical    Dyslipidemia    Elevated serum creatinine 08/17/2016   GERD (gastroesophageal reflux disease)    History of esophageal dilatation    2013   History of ovarian cyst    Hypothyroidism    Lactose intolerance    OSA on CPAP    study done 04-08-2013   Pre-diabetes    Right thyroid  nodule    SUI (stress urinary incontinence, female)    Tonsillar exudate 08/13/2014   Wears glasses    Past Surgical History:  Procedure Laterality Date   CESAREAN SECTION  1997   Twins-1 twin del.by c/s, 1 del.vaginally   COLONOSCOPY  04/04/2011   Internal hemorrhoids/Nl colonoscopy-SLIGHTLY TORTUOUS COLON   DILATATION & CURETTAGE/HYSTEROSCOPY WITH MYOSURE N/A 08/24/2016   Procedure: DILATATION & CURETTAGE/HYSTEROSCOPY WITH MYOSURE Possible Myosure ;  Surgeon: Bobie FORBES Cathlyn JAYSON Nikki, MD;  Location: [facility];  Service: Gynecology;  Laterality: N/A;   DILATION AND CURETTAGE OF UTERUS  1998   Seconday to miscarriage   ESOPHAGOGASTRODUODENOSCOPY  09/22/2011   Mild gastritis/Hiatal hernia/DYSPHAGIA DUE TO PEPTIC STRICTURE/UNCONTROLLED GERD   KNEE ARTHROSCOPY Right 2003   MALONEY DILATION  09/22/2011   Procedure: MALONEY DILATION;  Surgeon: Margo LITTIE Haddock, MD;  Location: [facility];  Service: Endoscopy;  Laterality: N/A;   REPLACEMENT TOTAL KNEE Right 2022   SAVORY DILATION  09/22/2011   Procedure: SAVORY DILATION;  Surgeon: Margo LITTIE Haddock, MD;  Location: [facility];  Service: Endoscopy;  Laterality: N/A;   Patient Active Problem List   Diagnosis Date Noted   OSA (obstructive sleep apnea)  09/04/2022   Annual visit for general adult medical examination with abnormal findings 09/04/2022   Esophageal dysphagia 09/04/2022   RUQ pain 09/04/2022   Calcific tendinitis of left shoulder 01/02/2022   Pain of left calf 07/01/2021   Arthrofibrosis of knee joint, right 12/26/2020   Mixed hyperlipidemia 12/18/2019   Sacroiliitis 05/29/2019   Elevated serum creatinine 08/26/2018   Dyspepsia 06/03/2018   Vitamin D  deficiency 04/05/2017   Primary hypothyroidism 03/11/2015   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   Polymenorrhea 08/13/2014   Pain in joint, ankle and foot 08/13/2014   Metabolic syndrome X 08/24/2013   Sleep disorder 02/01/2013   Prediabetes 01/30/2013   OA (osteoarthritis) of knee 10/03/2012   Cervical back pain with evidence of disc disease 09/24/2012   Hypothyroidism 09/21/2011   ABNORMAL ELECTROCARDIOGRAM 03/04/2010   Anemia 09/15/2009   Leukocytopenia 09/15/2009   Obesity (BMI 30.0-34.9) 04/24/2009   Dyslipidemia 05/23/2007   GERD 05/23/2007    ONSET DATE: DOI 08/07/23  REFERRING DIAG: M79.641, M25.521  THERAPY DIAG:  No diagnosis found.  Rationale for Evaluation and Treatment: Rehabilitation  PERTINENT HISTORY: She attended to outpatient therapy sessions at [facility], but apparently they found her within functional limits and discharged somewhat unsuccessfully with her having continued reports of pain and coordination issues.   She experienced a fall in April 2025 hitting both elbows, breaking the Left radial head, had therapy afterwards for the Lt elbow and arm which does feel better now.   She was having nerve pain and tingling  to Rt hand Small Fingers, but this has gotten much better.   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    PRECAUTIONS: None  RED FLAGS: None   WEIGHT BEARING RESTRICTIONS: No   SUBJECTIVE:   SUBJECTIVE STATEMENT: She states ***.    PAIN:  Are you having pain? Yes: NPRS scale: *** Mild at  rest but up to moderate in the past week at worst Pain location: Rt lateral elbow  Pain description: Aching and sore Aggravating factors: Lifting with palm facing down Relieving factors: Rest  PATIENT GOALS: To improve pain and use with the right hand and arm for dentistry  NEXT MD VISIT: TBD    OBJECTIVE: (All objective assessments below are from initial evaluation on: 02/26/24 unless otherwise specified.)   HAND DOMINANCE: Right   ADLs: Overall ADLs: States decreased ability to grab, hold household objects, pain and difficulty to open containers, perform FMS tasks (manipulate fasteners on clothing), difficulty with work tasks   FUNCTIONAL OUTCOME MEASURES: Eval: Patient Specific Functional Scale: 4.8 (Open bottles, grip tightly, pick up items with force, extract teeth with force, hold handpiece steady and prepped teeth)  (Higher Score  =  Better Ability for the Selected Tasks)      UPPER EXTREMITY ROM     Shoulder to Wrist AROM Right eval Left eval Rt 03/19/24  Elbow flexion     Elbow extension     Forearm supination     Forearm pronation      Wrist flexion 63 64 ***  Wrist extension 51 64 ***  Wrist ulnar deviation     Wrist radial deviation     Functional dart thrower's motion (F-DTM) in ulnar flexion     F-DTM in radial extension      (Blank rows = not tested)   Hand AROM Right eval  Full Fist Ability (or Gap to Distal Palmar Crease) Full fist  Thumb Opposition  (Kapandji Scale)  Within normal limits  (Blank rows = not tested)   UPPER EXTREMITY MMT:     MMT Right 02/06/24  Shoulder flexion   Shoulder abduction   Shoulder adduction   Shoulder extension   Shoulder internal rotation   Shoulder external rotation   Middle trapezius   Lower trapezius   Elbow flexion   Elbow extension   Forearm supination   Forearm pronation   Wrist flexion 5/5  Wrist extension Painful 4-/5  Wrist ulnar deviation   Wrist radial deviation   (Blank rows = not  tested)  HAND FUNCTION: 03/19/24 Grip Rt: ***#    Eval: Observed weakness in affected Rt hand.  Grip strength Right: tender 39.5 lbs, Left: 47.7 lbs  Rt 3JC pinch 9# tender; Lt:10#    COORDINATION: Eval: No significant coordination deficits noted though she has some complaints.  This could be due to past ulnar nerve numbness in the hand which has been resolving 9 Hole Peg Test Right: 20sec, Left: 22 sec (22 and 25 seconds are within functional limits, respectively to the right and left hands)   SENSATION: Eval:  Light touch intact today but positive elbow flexion test Rt arm, slightly positive Tinel at Rt cubital tunnel   OBSERVATIONS:   Eval:  positive resisted wrist extension test, tender to Rt lateral epicondyle; tender in Rt arm ulnar nerve   Presents like Rt lateral epicondylitis and subacute ulnar neuralgia    TODAY'S TREATMENT:  03/19/24: *** Review initial HEP and recommendations, consider dry needling, consider upgrading  to isometrics when tolerated.    Exercises - Triceps Stretch- Do on back of chair   - 3-4 x daily - 3-5 reps - 15 hold - Forearm Pronation Stretch  - 3-4 x daily - 3-5 reps - 15 sec hold - Seated Wrist Flexion Stretch  - 3-4 x daily - 3 reps - 15 second  hold - Ulnar Nerve Flossing  - 3-4 x daily - 1-2 sets - 5-10 reps  She was also given the following and hand manipulation skills to try to perform to improve her coordination skills, per her request.  In-Hand Manipulation Skills Rotation:  Hold pen, try to "twirl" like a baton, keeping parallel (or flat) with surface of table. Try going BOTH directions 10x  Flip:  Hold pen in writing position,  flip in an arch to "erase" position, then back to "write" position. Do not lift hand off table.  10x  Translation:  Open hand palm up,  put an object in your palm and then use your fingers and thumb to move it to the tips of your fingers, pinched against your thumb. (bigger is easier (fat marker), smaller is  harder (penny)) 10x  Shift:  Hold pen like a dart, start "shifting" it forward & backwards from tip to base (like putting a key in a key hole) 10x      PATIENT EDUCATION: Education details: See tx section above for details  Person educated: Patient Education method: Verbal Instruction, Teach back, Handouts  Education comprehension: States and demonstrates understanding, Additional Education required    HOME EXERCISE PROGRAM: Access Code: [email or web address] URL: [email or web address] Date: 02/27/2024 Prepared by: Melvenia Ada   GOALS: Goals reviewed with patient? Yes   SHORT TERM GOALS: (STG required if POC>30 days) Target Date: 03/14/2024  Pt will obtain protective, custom orthotic. Goal status: TBD/PRN  2.  Pt will demo/state understanding of initial HEP to improve pain levels and prerequisite motion. Goal status: 03/19/24: ***   LONG TERM GOALS: Target Date: 04/11/2024  Pt will improve functional ability by decreased impairment per PSFS assessment from 4.8 to 6.8 or better, for better quality of life. Goal status: INITIAL  2.  Pt will improve grip strength in right hand from 40 lbs to at least 45 lbs for functional use at home and in IADLs. Goal status: INITIAL  3.  Pt will improve A/ROM in right wrist extension from 51 degrees to at least 60 degrees, to have functional motion for tasks like reach and grasp.  Goal status: INITIAL  4.  Pt will improve strength in right wrist extension from 4 -/5 MMT to at least 4+/5 MMT to have increased functional ability to carry out selfcare and higher-level homecare tasks with less difficulty. Goal status: INITIAL  5.  Pt will decrease pain at worst from moderate  to none significant  to have better sleep and occupational participation in daily roles. Goal status: INITIAL   ASSESSMENT:  CLINICAL IMPRESSION: 03/19/24: ***  Eval: Patient is a 63 y.o. female who was seen today for occupational therapy evaluation  for weakness, pain, decreased coordination and functional ability in the right hand and arm.  The patient will benefit from outpatient occupational therapy to decrease symptoms, improve functional upper extremity use, and increase quality of life.    PLAN:  OT FREQUENCY: 1-2x/week  OT DURATION: 6 weeks through 04/11/24 and up to 7 total visits as needed   PLANNED INTERVENTIONS: 97535 self care/ADL training, 02889 therapeutic exercise, 97530 therapeutic activity, 97112 neuromuscular re-education, 97140  manual therapy, [id number] ultrasound, 02967 electrical stimulation (manual), [id number] Orthotic Initial, [id number] Orthotic/Prosthetic subsequent, compression bandaging, Dry needling, energy conservation, coping strategies training, and patient/family education  CONSULTED AND AGREED WITH PLAN OF CARE: Patient  PLAN FOR NEXT SESSION:   ***  Melvenia Ada, OTR/L, CHT  03/17/2024, 2:32 PM

## 2024-03-19 ENCOUNTER — Encounter: Admitting: Rehabilitative and Restorative Service Providers"

## 2024-03-25 NOTE — Therapy (Signed)
 OUTPATIENT OCCUPATIONAL THERAPY TREATMENT NOTE   Patient Name: Stacy [name] MRN: [id number] DOB:[birth year], 63 y.o., female Today's Date: 03/26/2024  PCP: Antonetta BATTLE MD REFERRING PROVIDER: Brenna Lin, MD   END OF SESSION:  OT End of Session - 03/26/24 1515     Visit Number 2    Number of Visits 7    Date for Recertification  04/11/24    Authorization Type [employer]    OT Start Time 1515    OT Stop Time 1540    OT Time Calculation (min) 25 min    Activity Tolerance Patient tolerated treatment well;No increased pain;Patient limited by fatigue;Patient limited by pain    Behavior During Therapy [facility] for tasks assessed/performed           Past Medical History:  Diagnosis Date   Abnormal uterine bleeding    Chronic leukopenia 08/17/2016   WBC = 3.5   DDD (degenerative disc disease), cervical    Dyslipidemia    Elevated serum creatinine 08/17/2016   GERD (gastroesophageal reflux disease)    History of esophageal dilatation    2013   History of ovarian cyst    Hypothyroidism    Lactose intolerance    OSA on CPAP    study done 04-08-2013   Pre-diabetes    Right thyroid  nodule    SUI (stress urinary incontinence, female)    Tonsillar exudate 08/13/2014   Wears glasses    Past Surgical History:  Procedure Laterality Date   CESAREAN SECTION  1997   Twins-1 twin del.by c/s, 1 del.vaginally   COLONOSCOPY  04/04/2011   Internal hemorrhoids/Nl colonoscopy-SLIGHTLY TORTUOUS COLON   DILATATION & CURETTAGE/HYSTEROSCOPY WITH MYOSURE N/A 08/24/2016   Procedure: DILATATION & CURETTAGE/HYSTEROSCOPY WITH MYOSURE Possible Myosure ;  Surgeon: Bobie FORBES Cathlyn JAYSON Nikki, MD;  Location: [facility];  Service: Gynecology;  Laterality: N/A;   DILATION AND CURETTAGE OF UTERUS  1998   Seconday to miscarriage   ESOPHAGOGASTRODUODENOSCOPY  09/22/2011   Mild gastritis/Hiatal hernia/DYSPHAGIA DUE TO PEPTIC STRICTURE/UNCONTROLLED GERD   KNEE ARTHROSCOPY Right 2003   MALONEY DILATION   09/22/2011   Procedure: MALONEY DILATION;  Surgeon: Margo LITTIE Haddock, MD;  Location: [facility];  Service: Endoscopy;  Laterality: N/A;   REPLACEMENT TOTAL KNEE Right 2022   SAVORY DILATION  09/22/2011   Procedure: SAVORY DILATION;  Surgeon: Margo LITTIE Haddock, MD;  Location: [facility];  Service: Endoscopy;  Laterality: N/A;   Patient Active Problem List   Diagnosis Date Noted   OSA (obstructive sleep apnea) 09/04/2022   Annual visit for general adult medical examination with abnormal findings 09/04/2022   Esophageal dysphagia 09/04/2022   RUQ pain 09/04/2022   Calcific tendinitis of left shoulder 01/02/2022   Pain of left calf 07/01/2021   Arthrofibrosis of knee joint, right 12/26/2020   Mixed hyperlipidemia 12/18/2019   Sacroiliitis 05/29/2019   Elevated serum creatinine 08/26/2018   Dyspepsia 06/03/2018   Vitamin D  deficiency 04/05/2017   Primary hypothyroidism 03/11/2015   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   Polymenorrhea 08/13/2014   Pain in joint, ankle and foot 08/13/2014   Metabolic syndrome X 08/24/2013   Sleep disorder 02/01/2013   Prediabetes 01/30/2013   OA (osteoarthritis) of knee 10/03/2012   Cervical back pain with evidence of disc disease 09/24/2012   Hypothyroidism 09/21/2011   ABNORMAL ELECTROCARDIOGRAM 03/04/2010   Anemia 09/15/2009   Leukocytopenia 09/15/2009   Obesity (BMI 30.0-34.9) 04/24/2009   Dyslipidemia 05/23/2007   GERD 05/23/2007    ONSET DATE: DOI 08/07/23  REFERRING DIAG: M79.641, M25.521  THERAPY DIAG:  Pain in right elbow  Muscle weakness (generalized)  Rationale for Evaluation and Treatment: Rehabilitation  PERTINENT HISTORY: She attended to outpatient therapy sessions at [facility], but apparently they found her within functional limits and discharged somewhat unsuccessfully with her having continued reports of pain and coordination issues.   She experienced a fall in April 2025 hitting both elbows, breaking the Left  radial head, had therapy afterwards for the Lt elbow and arm which does feel better now.   She was having nerve pain and tingling to Rt hand Small Fingers, but this has gotten much better.   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    PRECAUTIONS: None  RED FLAGS: None   WEIGHT BEARING RESTRICTIONS: No   SUBJECTIVE:   SUBJECTIVE STATEMENT: She returns after a month of self-management today.  She states she hopes that she is doing better, because she is feeling less pain.SABRA    PAIN:  Are you having pain? Yes: NPRS scale: 1/10 now at rest, up to 3/10 at worst in past week  Pain location: Rt lateral elbow  Pain description: Aching and sore Aggravating factors: Lifting with palm facing down Relieving factors: Rest  PATIENT GOALS: To improve pain and use with the right hand and arm for dentistry  NEXT MD VISIT: TBD    OBJECTIVE: (All objective assessments below are from initial evaluation on: 02/26/24 unless otherwise specified.)   HAND DOMINANCE: Right   ADLs: Overall ADLs: States decreased ability to grab, hold household objects, pain and difficulty to open containers, perform FMS tasks (manipulate fasteners on clothing), difficulty with work tasks   FUNCTIONAL OUTCOME MEASURES: Eval: Patient Specific Functional Scale: 4.8 (Open bottles, grip tightly, pick up items with force, extract teeth with force, hold handpiece steady and prepped teeth)  (Higher Score  =  Better Ability for the Selected Tasks)      UPPER EXTREMITY ROM     Shoulder to Wrist AROM Right eval Left eval Rt 03/26/24  Elbow flexion     Elbow extension     Forearm supination     Forearm pronation      Wrist flexion 63 64 54  Wrist extension 51 64 76  Wrist ulnar deviation     Wrist radial deviation     Functional dart thrower's motion (F-DTM) in ulnar flexion     F-DTM in radial extension      (Blank rows = not tested)   Hand AROM Right eval  Full Fist Ability (or Gap to Distal  Palmar Crease) Full fist  Thumb Opposition  (Kapandji Scale)  Within normal limits  (Blank rows = not tested)   UPPER EXTREMITY MMT:     MMT Right 02/06/24 Rt 03/26/24  Shoulder flexion    Shoulder abduction    Shoulder adduction    Shoulder extension    Shoulder internal rotation    Shoulder external rotation    Middle trapezius    Lower trapezius    Elbow flexion    Elbow extension    Forearm supination    Forearm pronation    Wrist flexion 5/5 5/5  Wrist extension Painful 4-/5 4+/5 only mildly pulling   Wrist ulnar deviation    Wrist radial deviation    (Blank rows = not tested)  HAND FUNCTION: 03/26/24 Grip Rt: 42.8#    Eval: Observed weakness in affected Rt hand.  Grip strength Right: tender 39.5 lbs, Left:  47.7 lbs  Rt 3JC pinch 9# tender; Lt:10#    COORDINATION: Eval: No significant coordination deficits noted though she has some complaints.  This could be due to past ulnar nerve numbness in the hand which has been resolving 9 Hole Peg Test Right: 20sec, Left: 22 sec (22 and 25 seconds are within functional limits, respectively to the right and left hands)   SENSATION: Eval:  Light touch intact today but positive elbow flexion test Rt arm, slightly positive Tinel at Rt cubital tunnel   OBSERVATIONS:   Eval:  positive resisted wrist extension test, tender to Rt lateral epicondyle; tender in Rt arm ulnar nerve   Presents like Rt lateral epicondylitis and subacute ulnar neuralgia    TODAY'S TREATMENT:  03/26/24: She starts with active range of motion for exercise as well as gripping and resistance against therapist strength.  Fortunately all of these things are much improved, and she is not having much pain.  She is definitely managing well and is ready for the next step in the therapy process.  OT educates on eccentric strengthening for wrist extension as well as biceps in a pronated position.  She tolerates 2 pounds at the wrist and 3 pounds 3 pounds for the  biceps.  She is taught to do these only on days where she is feeling pretty good, and only after warming up and stretching.  She tolerates them well today with just a slight bit of tenderness and is not significantly sore afterwards per her words.  We review her other stretches and also add a prayer stretch to compensate for slight lack of wrist flexion compared to her last visit.  She states understanding all directions and leaves without any pain or questions     Exercises - Triceps Stretch- Do on back of chair   - 3-4 x daily - 3-5 reps - 15 hold - Seated Wrist Flexion Stretch  - 3-4 x daily - 3 reps - 15 second  hold - Wrist Prayer Stretch  - 4 x daily - 3-5 reps - 15 sec hold - Seated Eccentric Wrist Extension  - 2 x daily - 4-5 x weekly - 1 sets - 5-10 reps - 10 sec hold - Eccentric Bicep Strength  - 2 x daily - 4-5 x weekly - 1 sets - 5-10 reps - 10 sec hold     PATIENT EDUCATION: Education details: See tx section above for details  Person educated: Patient Education method: Verbal Instruction, Teach back, Handouts  Education comprehension: States and demonstrates understanding, Additional Education required    HOME EXERCISE PROGRAM: Access Code: [email or web address] URL: [email or web address] Date: 02/27/2024 Prepared by: Melvenia Ada   GOALS: Goals reviewed with patient? Yes   SHORT TERM GOALS: (STG required if POC>30 days) Target Date: 03/14/2024  Pt will obtain protective, custom orthotic. Goal status: TBD/PRN  2.  Pt will demo/state understanding of initial HEP to improve pain levels and prerequisite motion. Goal status: 03/26/24: Goal met   LONG TERM GOALS: Target Date: 04/11/2024  Pt will improve functional ability by decreased impairment per PSFS assessment from 4.8 to 6.8 or better, for better quality of life. Goal status: INITIAL  2.  Pt will improve grip strength in right hand from 40 lbs to at least 45 lbs for functional use at home and in  IADLs. Goal status: INITIAL  3.  Pt will improve A/ROM in right wrist extension from 51 degrees to at least 60 degrees, to have functional motion for tasks like reach  and grasp.  Goal status: INITIAL  4.  Pt will improve strength in right wrist extension from 4 -/5 MMT to at least 4+/5 MMT to have increased functional ability to carry out selfcare and higher-level homecare tasks with less difficulty. Goal status: INITIAL  5.  Pt will decrease pain at worst from moderate  to none significant  to have better sleep and occupational participation in daily roles. Goal status: INITIAL   ASSESSMENT:  CLINICAL IMPRESSION: 03/26/24: Apparently she has been managing well over the past month, because she comes in today stating less pain, less functional problems, and is able to upgrade to light eccentric strengthening.  She was still cautioned to use care and not hurt herself with repetitive activities, weight, motions or prolonged postures  Eval: Patient is a 63 y.o. female who was seen today for occupational therapy evaluation for weakness, pain, decreased coordination and functional ability in the right hand and arm.  The patient will benefit from outpatient occupational therapy to decrease symptoms, improve functional upper extremity use, and increase quality of life.    PLAN:  OT FREQUENCY: 1-2x/week  OT DURATION: 6 weeks through 04/11/24 and up to 7 total visits as needed   PLANNED INTERVENTIONS: 97535 self care/ADL training, 02889 therapeutic exercise, 97530 therapeutic activity, 97112 neuromuscular re-education, 97140 manual therapy, 97035 ultrasound, 97032 electrical stimulation (manual), 97760 Orthotic Initial, [id number] Orthotic/Prosthetic subsequent, compression bandaging, Dry needling, energy conservation, coping strategies training, and patient/family education  CONSULTED AND AGREED WITH PLAN OF CARE: Patient  PLAN FOR NEXT SESSION:   Check eccentric strengthening, check long-term  goals, consider discharge if she feels comfortable with self-management.  Melvenia Ada, OTR/L, CHT  03/26/2024, 3:43 PM

## 2024-03-26 ENCOUNTER — Ambulatory Visit: Admitting: Rehabilitative and Restorative Service Providers"

## 2024-03-26 ENCOUNTER — Encounter: Payer: Self-pay | Admitting: Rehabilitative and Restorative Service Providers"

## 2024-03-26 DIAGNOSIS — M6281 Muscle weakness (generalized): Secondary | ICD-10-CM

## 2024-03-26 DIAGNOSIS — M25521 Pain in right elbow: Secondary | ICD-10-CM

## 2024-04-01 NOTE — Therapy (Incomplete)
 OUTPATIENT OCCUPATIONAL THERAPY TREATMENT NOTE   Patient Name: Stacy [name] MRN: [id number] DOB:10-28-1960, 64 y.o., female Today's Date: 04/01/2024  PCP: Antonetta BATTLE MD REFERRING PROVIDER: Brenna Lin, MD   END OF SESSION:     Past Medical History:  Diagnosis Date   Abnormal uterine bleeding    Chronic leukopenia 08/17/2016   WBC = 3.5   DDD (degenerative disc disease), cervical    Dyslipidemia    Elevated serum creatinine 08/17/2016   GERD (gastroesophageal reflux disease)    History of esophageal dilatation    2013   History of ovarian cyst    Hypothyroidism    Lactose intolerance    OSA on CPAP    study done 04-08-2013   Pre-diabetes    Right thyroid  nodule    SUI (stress urinary incontinence, female)    Tonsillar exudate 08/13/2014   Wears glasses    Past Surgical History:  Procedure Laterality Date   CESAREAN SECTION  1997   Twins-1 twin del.by c/s, 1 del.vaginally   COLONOSCOPY  04/04/2011   Internal hemorrhoids/Nl colonoscopy-SLIGHTLY TORTUOUS COLON   DILATATION & CURETTAGE/HYSTEROSCOPY WITH MYOSURE N/A 08/24/2016   Procedure: DILATATION & CURETTAGE/HYSTEROSCOPY WITH MYOSURE Possible Myosure ;  Surgeon: Bobie FORBES Cathlyn JAYSON Nikki, MD;  Location: [facility];  Service: Gynecology;  Laterality: N/A;   DILATION AND CURETTAGE OF UTERUS  1998   Seconday to miscarriage   ESOPHAGOGASTRODUODENOSCOPY  09/22/2011   Mild gastritis/Hiatal hernia/DYSPHAGIA DUE TO PEPTIC STRICTURE/UNCONTROLLED GERD   KNEE ARTHROSCOPY Right 2003   MALONEY DILATION  09/22/2011   Procedure: MALONEY DILATION;  Surgeon: Margo LITTIE Haddock, MD;  Location: [facility];  Service: Endoscopy;  Laterality: N/A;   REPLACEMENT TOTAL KNEE Right 2022   SAVORY DILATION  09/22/2011   Procedure: SAVORY DILATION;  Surgeon: Margo LITTIE Haddock, MD;  Location: [facility];  Service: Endoscopy;  Laterality: N/A;   Patient Active Problem List   Diagnosis Date Noted   OSA (obstructive sleep apnea)  09/04/2022   Annual visit for general adult medical examination with abnormal findings 09/04/2022   Esophageal dysphagia 09/04/2022   RUQ pain 09/04/2022   Calcific tendinitis of left shoulder 01/02/2022   Pain of left calf 07/01/2021   Arthrofibrosis of knee joint, right 12/26/2020   Mixed hyperlipidemia 12/18/2019   Sacroiliitis 05/29/2019   Elevated serum creatinine 08/26/2018   Dyspepsia 06/03/2018   Vitamin D  deficiency 04/05/2017   Primary hypothyroidism 03/11/2015   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   Polymenorrhea 08/13/2014   Pain in joint, ankle and foot 08/13/2014   Metabolic syndrome X 08/24/2013   Sleep disorder 02/01/2013   Prediabetes 01/30/2013   OA (osteoarthritis) of knee 10/03/2012   Cervical back pain with evidence of disc disease 09/24/2012   Hypothyroidism 09/21/2011   ABNORMAL ELECTROCARDIOGRAM 03/04/2010   Anemia 09/15/2009   Leukocytopenia 09/15/2009   Obesity (BMI 30.0-34.9) 04/24/2009   Dyslipidemia 05/23/2007   GERD 05/23/2007    ONSET DATE: DOI 08/07/23  REFERRING DIAG: M79.641, M25.521  THERAPY DIAG:  No diagnosis found.  Rationale for Evaluation and Treatment: Rehabilitation  PERTINENT HISTORY: She attended to outpatient therapy sessions at [facility], but apparently they found her within functional limits and discharged somewhat unsuccessfully with her having continued reports of pain and coordination issues.   She experienced a fall in April 2025 hitting both elbows, breaking the Left radial head, had therapy afterwards for the Lt elbow and arm which does feel better now.   She was having nerve pain and  tingling to Rt hand Small Fingers, but this has gotten much better.   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    PRECAUTIONS: None  RED FLAGS: None   WEIGHT BEARING RESTRICTIONS: No   SUBJECTIVE:   SUBJECTIVE STATEMENT: She states ***   she hopes that she is doing better, because she is feeling  less pain.SABRA    PAIN:  Are you having pain? Yes: NPRS scale:  ***1/10 now at rest, up to 3/10 at worst in past week  Pain location: Rt lateral elbow  Pain description: Aching and sore Aggravating factors: Lifting with palm facing down Relieving factors: Rest  PATIENT GOALS: To improve pain and use with the right hand and arm for dentistry  NEXT MD VISIT: TBD    OBJECTIVE: (All objective assessments below are from initial evaluation on: 02/26/24 unless otherwise specified.)   HAND DOMINANCE: Right   ADLs: Overall ADLs: States decreased ability to grab, hold household objects, pain and difficulty to open containers, perform FMS tasks (manipulate fasteners on clothing), difficulty with work tasks   FUNCTIONAL OUTCOME MEASURES: 04/02/24: PSFS: ***   Eval: Patient Specific Functional Scale: 4.8 (Open bottles, grip tightly, pick up items with force, extract teeth with force, hold handpiece steady and prepped teeth)  (Higher Score  =  Better Ability for the Selected Tasks)      UPPER EXTREMITY ROM     Shoulder to Wrist AROM Right eval Left eval Rt 03/26/24 Rt 04/02/24  Elbow flexion      Elbow extension      Forearm supination      Forearm pronation       Wrist flexion 63 64 54 ***  Wrist extension 51 64 76 ***  Wrist ulnar deviation      Wrist radial deviation      Functional dart thrower's motion (F-DTM) in ulnar flexion      F-DTM in radial extension       (Blank rows = not tested)   Hand AROM Right eval  Full Fist Ability (or Gap to Distal Palmar Crease) Full fist  Thumb Opposition  (Kapandji Scale)  Within normal limits  (Blank rows = not tested)   UPPER EXTREMITY MMT:     MMT Right 02/06/24 Rt 03/26/24 Rt 04/02/24  Shoulder flexion     Shoulder abduction     Shoulder adduction     Shoulder extension     Shoulder internal rotation     Shoulder external rotation     Middle trapezius     Lower trapezius     Elbow flexion     Elbow extension      Forearm supination     Forearm pronation     Wrist flexion 5/5 5/5   Wrist extension Painful 4-/5 4+/5 only mildly pulling  ***/10  Wrist ulnar deviation     Wrist radial deviation     (Blank rows = not tested)  HAND FUNCTION: 04/02/24:  Grip: Rt ***   03/26/24 Grip Rt: 42.8#    Eval: Observed weakness in affected Rt hand.  Grip strength Right: tender 39.5 lbs, Left: 47.7 lbs  Rt 3JC pinch 9# tender; Lt:10#    COORDINATION: Eval: No significant coordination deficits noted though she has some complaints.  This could be due to past ulnar nerve numbness in the hand which has been resolving 9 Hole Peg Test Right: 20sec, Left: 22 sec (22 and 25 seconds are within functional limits, respectively to  the right and left hands)   SENSATION: Eval:  Light touch intact today but positive elbow flexion test Rt arm, slightly positive Tinel at Rt cubital tunnel   OBSERVATIONS:   Eval:  positive resisted wrist extension test, tender to Rt lateral epicondyle; tender in Rt arm ulnar nerve   Presents like Rt lateral epicondylitis and subacute ulnar neuralgia    TODAY'S TREATMENT:  04/02/24: *** Check eccentric strengthening, check long-term goals, consider discharge if she feels comfortable with self-management.    03/26/24: She starts with active range of motion for exercise as well as gripping and resistance against therapist strength.  Fortunately all of these things are much improved, and she is not having much pain.  She is definitely managing well and is ready for the next step in the therapy process.  OT educates on eccentric strengthening for wrist extension as well as biceps in a pronated position.  She tolerates 2 pounds at the wrist and 3 pounds 3 pounds for the biceps.  She is taught to do these only on days where she is feeling pretty good, and only after warming up and stretching.  She tolerates them well today with just a slight bit of tenderness and is not significantly sore  afterwards per her words.  We review her other stretches and also add a prayer stretch to compensate for slight lack of wrist flexion compared to her last visit.  She states understanding all directions and leaves without any pain or questions     Exercises - Triceps Stretch- Do on back of chair   - 3-4 x daily - 3-5 reps - 15 hold - Seated Wrist Flexion Stretch  - 3-4 x daily - 3 reps - 15 second  hold - Wrist Prayer Stretch  - 4 x daily - 3-5 reps - 15 sec hold - Seated Eccentric Wrist Extension  - 2 x daily - 4-5 x weekly - 1 sets - 5-10 reps - 10 sec hold - Eccentric Bicep Strength  - 2 x daily - 4-5 x weekly - 1 sets - 5-10 reps - 10 sec hold     PATIENT EDUCATION: Education details: See tx section above for details  Person educated: Patient Education method: Verbal Instruction, Teach back, Handouts  Education comprehension: States and demonstrates understanding, Additional Education required    HOME EXERCISE PROGRAM: Access Code: [email or web address] URL: [email or web address] Date: 02/27/2024 Prepared by: Melvenia Ada   GOALS: Goals reviewed with patient? Yes   SHORT TERM GOALS: (STG required if POC>30 days) Target Date: 03/14/2024  Pt will obtain protective, custom orthotic. Goal status: 04/02/24: ***  2.  Pt will demo/state understanding of initial HEP to improve pain levels and prerequisite motion. Goal status: 03/26/24: Goal met   LONG TERM GOALS: Target Date: 04/11/2024  Pt will improve functional ability by decreased impairment per PSFS assessment from 4.8 to 6.8 or better, for better quality of life. Goal status: 04/02/24: ***  2.  Pt will improve grip strength in right hand from 40 lbs to at least 45 lbs for functional use at home and in IADLs. Goal status: 04/02/24: ***  3.  Pt will improve A/ROM in right wrist extension from 51 degrees to at least 60 degrees, to have functional motion for tasks like reach and grasp.  Goal status: 04/02/24:  ***  4.  Pt will improve strength in right wrist extension from 4 -/5 MMT to at least 4+/5 MMT to have increased functional ability to carry out selfcare and higher-level  homecare tasks with less difficulty. Goal status: 04/02/24: ***  5.  Pt will decrease pain at worst from moderate  to none significant  to have better sleep and occupational participation in daily roles. Goal status: 04/02/24: ***   ASSESSMENT:  CLINICAL IMPRESSION: 04/02/24: ***  03/26/24: Apparently she has been managing well over the past month, because she comes in today stating less pain, less functional problems, and is able to upgrade to light eccentric strengthening.  She was still cautioned to use care and not hurt herself with repetitive activities, weight, motions or prolonged postures  Eval: Patient is a 63 y.o. female who was seen today for occupational therapy evaluation for weakness, pain, decreased coordination and functional ability in the right hand and arm.  The patient will benefit from outpatient occupational therapy to decrease symptoms, improve functional upper extremity use, and increase quality of life.    PLAN:  OT FREQUENCY: 1-2x/week  OT DURATION: 6 weeks through 04/11/24 and up to 7 total visits as needed   PLANNED INTERVENTIONS: 97535 self care/ADL training, 02889 therapeutic exercise, 97530 therapeutic activity, 97112 neuromuscular re-education, 97140 manual therapy, 97035 ultrasound, 97032 electrical stimulation (manual), 97760 Orthotic Initial, [id number] Orthotic/Prosthetic subsequent, compression bandaging, Dry needling, energy conservation, coping strategies training, and patient/family education  CONSULTED AND AGREED WITH PLAN OF CARE: Patient  PLAN FOR NEXT SESSION:   ***   Melvenia Ada, OTR/L, CHT  04/01/2024, 12:50 PM

## 2024-04-02 ENCOUNTER — Encounter: Admitting: Rehabilitative and Restorative Service Providers"

## 2024-04-09 ENCOUNTER — Encounter: Admitting: Rehabilitative and Restorative Service Providers"

## 2024-04-10 ENCOUNTER — Encounter: Admitting: Rehabilitative and Restorative Service Providers"

## 2024-04-15 NOTE — Therapy (Incomplete)
 " OUTPATIENT OCCUPATIONAL THERAPY TREATMENT & *** NOTE   Patient Name: Stacy [name] MRN: [id number] DOB:[birth year], 63 y.o., female Today's Date: 04/15/2024  PCP: Antonetta BATTLE MD REFERRING PROVIDER: Brenna Lin, MD                ***            END OF SESSION:     Past Medical History:  Diagnosis Date   Abnormal uterine bleeding    Chronic leukopenia 08/17/2016   WBC = 3.5   DDD (degenerative disc disease), cervical    Dyslipidemia    Elevated serum creatinine 08/17/2016   GERD (gastroesophageal reflux disease)    History of esophageal dilatation    2013   History of ovarian cyst    Hypothyroidism    Lactose intolerance    OSA on CPAP    study done 04-08-2013   Pre-diabetes    Right thyroid  nodule    SUI (stress urinary incontinence, female)    Tonsillar exudate 08/13/2014   Wears glasses    Past Surgical History:  Procedure Laterality Date   CESAREAN SECTION  1997   Twins-1 twin del.by c/s, 1 del.vaginally   COLONOSCOPY  04/04/2011   Internal hemorrhoids/Nl colonoscopy-SLIGHTLY TORTUOUS COLON   DILATATION & CURETTAGE/HYSTEROSCOPY WITH MYOSURE N/A 08/24/2016   Procedure: DILATATION & CURETTAGE/HYSTEROSCOPY WITH MYOSURE Possible Myosure ;  Surgeon: Bobie FORBES Cathlyn JAYSON Nikki, MD;  Location: [facility];  Service: Gynecology;  Laterality: N/A;   DILATION AND CURETTAGE OF UTERUS  1998   Seconday to miscarriage   ESOPHAGOGASTRODUODENOSCOPY  09/22/2011   Mild gastritis/Hiatal hernia/DYSPHAGIA DUE TO PEPTIC STRICTURE/UNCONTROLLED GERD   KNEE ARTHROSCOPY Right 2003   MALONEY DILATION  09/22/2011   Procedure: MALONEY DILATION;  Surgeon: Margo LITTIE Haddock, MD;  Location: [facility];  Service: Endoscopy;  Laterality: N/A;   REPLACEMENT TOTAL KNEE Right 2022   SAVORY DILATION  09/22/2011   Procedure: SAVORY DILATION;  Surgeon: Margo LITTIE Haddock, MD;  Location: [facility];  Service: Endoscopy;  Laterality: N/A;   Patient Active Problem List    Diagnosis Date Noted   OSA (obstructive sleep apnea) 09/04/2022   Annual visit for general adult medical examination with abnormal findings 09/04/2022   Esophageal dysphagia 09/04/2022   RUQ pain 09/04/2022   Calcific tendinitis of left shoulder 01/02/2022   Pain of left calf 07/01/2021   Arthrofibrosis of knee joint, right 12/26/2020   Mixed hyperlipidemia 12/18/2019   Sacroiliitis 05/29/2019   Elevated serum creatinine 08/26/2018   Dyspepsia 06/03/2018   Vitamin D  deficiency 04/05/2017   Primary hypothyroidism 03/11/2015   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   Polymenorrhea 08/13/2014   Pain in joint, ankle and foot 08/13/2014   Metabolic syndrome X 08/24/2013   Sleep disorder 02/01/2013   Prediabetes 01/30/2013   OA (osteoarthritis) of knee 10/03/2012   Cervical back pain with evidence of disc disease 09/24/2012   Hypothyroidism 09/21/2011   ABNORMAL ELECTROCARDIOGRAM 03/04/2010   Anemia 09/15/2009   Leukocytopenia 09/15/2009   Obesity (BMI 30.0-34.9) 04/24/2009   Dyslipidemia 05/23/2007   GERD 05/23/2007    ONSET DATE: DOI 08/07/23  REFERRING DIAG: M79.641, M25.521  THERAPY DIAG:  No diagnosis found.  Rationale for Evaluation and Treatment: Rehabilitation  PERTINENT HISTORY: She attended to outpatient therapy sessions at [facility], but apparently they found her within functional limits and discharged somewhat unsuccessfully with her having continued reports of pain and coordination issues.   She experienced a fall in April 2025 hitting both  elbows, breaking the Left radial head, had therapy afterwards for the Lt elbow and arm which does feel better now.   She was having nerve pain and tingling to Rt hand Small Fingers, but this has gotten much better.   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    PRECAUTIONS: None  RED FLAGS: None   WEIGHT BEARING RESTRICTIONS: No   SUBJECTIVE:   SUBJECTIVE STATEMENT: She states ***   she  hopes that she is doing better, because she is feeling less pain.SABRA    PAIN:  Are you having pain? Yes: NPRS scale:  ***1/10 now at rest, up to 3/10 at worst in past week  Pain location: Rt lateral elbow  Pain description: Aching and sore Aggravating factors: Lifting with palm facing down Relieving factors: Rest  PATIENT GOALS: To improve pain and use with the right hand and arm for dentistry  NEXT MD VISIT: TBD    OBJECTIVE: (All objective assessments below are from initial evaluation on: 02/26/24 unless otherwise specified.)   HAND DOMINANCE: Right   ADLs: Overall ADLs: States decreased ability to grab, hold household objects, pain and difficulty to open containers, perform FMS tasks (manipulate fasteners on clothing), difficulty with work tasks   FUNCTIONAL OUTCOME MEASURES: 04/22/24: PSFS: ***   Eval: Patient Specific Functional Scale: 4.8 (Open bottles, grip tightly, pick up items with force, extract teeth with force, hold handpiece steady and prepped teeth)  (Higher Score  =  Better Ability for the Selected Tasks)      UPPER EXTREMITY ROM     Shoulder to Wrist AROM Right eval Left eval Rt 03/26/24 Rt 04/22/24  Elbow flexion      Elbow extension      Forearm supination      Forearm pronation       Wrist flexion 63 64 54 ***  Wrist extension 51 64 76 ***  Wrist ulnar deviation      Wrist radial deviation      Functional dart thrower's motion (F-DTM) in ulnar flexion      F-DTM in radial extension       (Blank rows = not tested)   Hand AROM Right eval  Full Fist Ability (or Gap to Distal Palmar Crease) Full fist  Thumb Opposition  (Kapandji Scale)  Within normal limits  (Blank rows = not tested)   UPPER EXTREMITY MMT:     MMT Right 02/06/24 Rt 03/26/24 Rt 04/22/24  Shoulder flexion     Shoulder abduction     Shoulder adduction     Shoulder extension     Shoulder internal rotation     Shoulder external rotation     Middle trapezius     Lower  trapezius     Elbow flexion     Elbow extension     Forearm supination     Forearm pronation     Wrist flexion 5/5 5/5   Wrist extension Painful 4-/5 4+/5 only mildly pulling  ***/10  Wrist ulnar deviation     Wrist radial deviation     (Blank rows = not tested)  HAND FUNCTION: 04/22/24:  Grip: Rt ***   03/26/24 Grip Rt: 42.8#    Eval: Observed weakness in affected Rt hand.  Grip strength Right: tender 39.5 lbs, Left: 47.7 lbs  Rt 3JC pinch 9# tender; Lt:10#    COORDINATION: Eval: No significant coordination deficits noted though she has some complaints.  This could be due to past ulnar  nerve numbness in the hand which has been resolving 9 Hole Peg Test Right: 20sec, Left: 22 sec (22 and 25 seconds are within functional limits, respectively to the right and left hands)   SENSATION: Eval:  Light touch intact today but positive elbow flexion test Rt arm, slightly positive Tinel at Rt cubital tunnel   OBSERVATIONS:   Eval:  positive resisted wrist extension test, tender to Rt lateral epicondyle; tender in Rt arm ulnar nerve   Presents like Rt lateral epicondylitis and subacute ulnar neuralgia    TODAY'S TREATMENT:  04/22/24: *** Check eccentric strengthening, check long-term goals, consider discharge if she feels comfortable with self-management.    03/26/24: She starts with active range of motion for exercise as well as gripping and resistance against therapist strength.  Fortunately all of these things are much improved, and she is not having much pain.  She is definitely managing well and is ready for the next step in the therapy process.  OT educates on eccentric strengthening for wrist extension as well as biceps in a pronated position.  She tolerates 2 pounds at the wrist and 3 pounds 3 pounds for the biceps.  She is taught to do these only on days where she is feeling pretty good, and only after warming up and stretching.  She tolerates them well today with just a slight bit  of tenderness and is not significantly sore afterwards per her words.  We review her other stretches and also add a prayer stretch to compensate for slight lack of wrist flexion compared to her last visit.  She states understanding all directions and leaves without any pain or questions     Exercises - Triceps Stretch- Do on back of chair   - 3-4 x daily - 3-5 reps - 15 hold - Seated Wrist Flexion Stretch  - 3-4 x daily - 3 reps - 15 second  hold - Wrist Prayer Stretch  - 4 x daily - 3-5 reps - 15 sec hold - Seated Eccentric Wrist Extension  - 2 x daily - 4-5 x weekly - 1 sets - 5-10 reps - 10 sec hold - Eccentric Bicep Strength  - 2 x daily - 4-5 x weekly - 1 sets - 5-10 reps - 10 sec hold     PATIENT EDUCATION: Education details: See tx section above for details  Person educated: Patient Education method: Verbal Instruction, Teach back, Handouts  Education comprehension: States and demonstrates understanding, Additional Education required    HOME EXERCISE PROGRAM: Access Code: [email or web address] URL: [email or web address] Date: 02/27/2024 Prepared by: Melvenia Ada   GOALS: Goals reviewed with patient? Yes   SHORT TERM GOALS: (STG required if POC>30 days) Target Date: 03/14/2024  Pt will obtain protective, custom orthotic. Goal status: 04/22/24: ***  2.  Pt will demo/state understanding of initial HEP to improve pain levels and prerequisite motion. Goal status: 03/26/24: Goal met   LONG TERM GOALS: Target Date: 04/22/2024  Pt will improve functional ability by decreased impairment per PSFS assessment from 4.8 to 6.8 or better, for better quality of life. Goal status: 04/22/24: ***  2.  Pt will improve grip strength in right hand from 40 lbs to at least 45 lbs for functional use at home and in IADLs. Goal status: 04/22/24: ***  3.  Pt will improve A/ROM in right wrist extension from 51 degrees to at least 60 degrees, to have functional motion for tasks like  reach and grasp.  Goal status: 04/22/24: ***  4.  Pt will improve strength in right wrist extension from 4 -/5 MMT to at least 4+/5 MMT to have increased functional ability to carry out selfcare and higher-level homecare tasks with less difficulty. Goal status: 04/22/24: ***  5.  Pt will decrease pain at worst from moderate  to none significant  to have better sleep and occupational participation in daily roles. Goal status: 04/22/24: ***   ASSESSMENT:  CLINICAL IMPRESSION: 04/22/24: ***    PLAN:  OT FREQUENCY: 1-2x/week  OT DURATION: 6 weeks through 04/11/24 - 04/22/24 ***and up to 7 total visits as needed   PLANNED INTERVENTIONS: 97535 self care/ADL training, 02889 therapeutic exercise, 97530 therapeutic activity, 97112 neuromuscular re-education, 97140 manual therapy, 97035 ultrasound, 97032 electrical stimulation (manual), 97760 Orthotic Initial, [id number] Orthotic/Prosthetic subsequent, compression bandaging, Dry needling, energy conservation, coping strategies training, and patient/family education  CONSULTED AND AGREED WITH PLAN OF CARE: Patient  PLAN FOR NEXT SESSION:   ***   Melvenia Ada, OTR/L, CHT  04/15/2024, 10:47 AM   "

## 2024-04-22 ENCOUNTER — Encounter: Admitting: Rehabilitative and Restorative Service Providers"

## 2024-04-30 ENCOUNTER — Ambulatory Visit (INDEPENDENT_AMBULATORY_CARE_PROVIDER_SITE_OTHER): Payer: Self-pay | Admitting: Rehabilitative and Restorative Service Providers"

## 2024-04-30 ENCOUNTER — Encounter: Payer: Self-pay | Admitting: Rehabilitative and Restorative Service Providers"

## 2024-04-30 DIAGNOSIS — M6281 Muscle weakness (generalized): Secondary | ICD-10-CM | POA: Diagnosis not present

## 2024-04-30 DIAGNOSIS — R202 Paresthesia of skin: Secondary | ICD-10-CM

## 2024-04-30 DIAGNOSIS — M25521 Pain in right elbow: Secondary | ICD-10-CM | POA: Diagnosis not present

## 2024-04-30 NOTE — Therapy (Signed)
 " OUTPATIENT OCCUPATIONAL THERAPY TREATMENT & discharge NOTE   Patient Name: Stacy [name] MRN: [id number] DOB:01/27/1961, 64 y.o., female Today's Date: 04/30/2024  PCP: Antonetta BATTLE MD REFERRING PROVIDER: Brenna Lin, MD                OCCUPATIONAL THERAPY DISCHARGE SUMMARY  Visits from Start of Care: 3  Current functional level related to goals / functional outcomes:  CLINICAL IMPRESSION: 04/30/24: Fortunately she did meet 4 out of 5 long-term goals, but still having some tenderness and problems at the elbow.  She admits to not doing her home exercises consistently, and she only came to therapy very infrequently.  She does understand good maintenance strategies for this issue, and she was recommended to continue consistent stretches and light eccentric strengthening for at least a month, at which point she should transition to typical stretching and strengthening program 4 to 5 days a week to help prevent tendinitis and other forms of pain and problems (as she admits to not exercising at all typically).  Will discharge therapy today due to maximum benefit received from outpatient occupational therapy at this time.  She is welcome to return with a new order for any new or continued problems in the future as needed.   PLAN:  OT FREQUENCY: 1 last visit   OT DURATION: 3 additional weeks from 04/11/24 - 04/30/24 and up to 3 total visits to cover today's last visit  Education / Equipment: Pt has all needed materials and education. Pt understands how to continue on with self-management. See tx notes for more details.   Patient agrees to discharge due to max benefits received from outpatient occupational therapy / hand therapy at this time.   Melvenia Ada, OTR/L, CHT 04/30/24                END OF SESSION:  OT End of Session - 04/30/24 1414     Visit Number 3    Number of Visits 7    Date for Recertification  04/30/24    Authorization Type [employer]     OT Start Time 1414    OT Stop Time 1446    OT Time Calculation (min) 32 min    Activity Tolerance Patient tolerated treatment well;No increased pain;Patient limited by fatigue;Patient limited by pain    Behavior During Therapy [facility] for tasks assessed/performed            Past Medical History:  Diagnosis Date   Abnormal uterine bleeding    Chronic leukopenia 08/17/2016   WBC = 3.5   DDD (degenerative disc disease), cervical    Dyslipidemia    Elevated serum creatinine 08/17/2016   GERD (gastroesophageal reflux disease)    History of esophageal dilatation    2013   History of ovarian cyst    Hypothyroidism    Lactose intolerance    OSA on CPAP    study done 04-08-2013   Pre-diabetes    Right thyroid  nodule    SUI (stress urinary incontinence, female)    Tonsillar exudate 08/13/2014   Wears glasses    Past Surgical History:  Procedure Laterality Date   CESAREAN SECTION  1997   Twins-1 twin del.by c/s, 1 del.vaginally   COLONOSCOPY  04/04/2011   Internal hemorrhoids/Nl colonoscopy-SLIGHTLY TORTUOUS COLON   DILATATION & CURETTAGE/HYSTEROSCOPY WITH MYOSURE N/A 08/24/2016   Procedure: DILATATION & CURETTAGE/HYSTEROSCOPY WITH MYOSURE Possible Myosure ;  Surgeon: Bobie FORBES Cathlyn JAYSON Nikki, MD;  Location: [facility];  Service: Gynecology;  Laterality: N/A;  DILATION AND CURETTAGE OF UTERUS  1998   Seconday to miscarriage   ESOPHAGOGASTRODUODENOSCOPY  09/22/2011   Mild gastritis/Hiatal hernia/DYSPHAGIA DUE TO PEPTIC STRICTURE/UNCONTROLLED GERD   KNEE ARTHROSCOPY Right 2003   MALONEY DILATION  09/22/2011   Procedure: MALONEY DILATION;  Surgeon: Margo LITTIE Haddock, MD;  Location: [facility];  Service: Endoscopy;  Laterality: N/A;   REPLACEMENT TOTAL KNEE Right 2022   SAVORY DILATION  09/22/2011   Procedure: SAVORY DILATION;  Surgeon: Margo LITTIE Haddock, MD;  Location: [facility];  Service: Endoscopy;  Laterality: N/A;   Patient Active Problem List   Diagnosis Date Noted   OSA  (obstructive sleep apnea) 09/04/2022   Annual visit for general adult medical examination with abnormal findings 09/04/2022   Esophageal dysphagia 09/04/2022   RUQ pain 09/04/2022   Calcific tendinitis of left shoulder 01/02/2022   Pain of left calf 07/01/2021   Arthrofibrosis of knee joint, right 12/26/2020   Mixed hyperlipidemia 12/18/2019   Sacroiliitis 05/29/2019   Elevated serum creatinine 08/26/2018   Dyspepsia 06/03/2018   Vitamin D  deficiency 04/05/2017   Primary hypothyroidism 03/11/2015   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   Polymenorrhea 08/13/2014   Pain in joint, ankle and foot 08/13/2014   Metabolic syndrome X 08/24/2013   Sleep disorder 02/01/2013   Prediabetes 01/30/2013   OA (osteoarthritis) of knee 10/03/2012   Cervical back pain with evidence of disc disease 09/24/2012   Hypothyroidism 09/21/2011   ABNORMAL ELECTROCARDIOGRAM 03/04/2010   Anemia 09/15/2009   Leukocytopenia 09/15/2009   Obesity (BMI 30.0-34.9) 04/24/2009   Dyslipidemia 05/23/2007   GERD 05/23/2007    ONSET DATE: DOI 08/07/23  REFERRING DIAG: M79.641, M25.521  THERAPY DIAG:  Pain in right elbow  Muscle weakness (generalized)  Paresthesia of skin  Rationale for Evaluation and Treatment: Rehabilitation  PERTINENT HISTORY: She attended to outpatient therapy sessions at [facility], but apparently they found her within functional limits and discharged somewhat unsuccessfully with her having continued reports of pain and coordination issues.   She experienced a fall in April 2025 hitting both elbows, breaking the Left radial head, had therapy afterwards for the Lt elbow and arm which does feel better now.   She was having nerve pain and tingling to Rt hand Small Fingers, but this has gotten much better.   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    PRECAUTIONS: None  RED FLAGS: None   WEIGHT BEARING RESTRICTIONS: No   SUBJECTIVE:   SUBJECTIVE  STATEMENT: She has not come to therapy for over a month. She returns today and states she is feeling a lot better, but still has some tenderness to the elbow especially with lifting or force.  She has been unable to return to tooth extraction due to pain and problems.  She admits to not doing any eccentric strengthening as taught 1 month ago, and she admits to not doing consistent stretches either.    PAIN:  Are you having pain?  No pain now at rest or significantly in the past week   PATIENT GOALS: To improve pain and use with the right hand and arm for dentistry  NEXT MD VISIT: TBD    OBJECTIVE: (All objective assessments below are from initial evaluation on: 02/26/24 unless otherwise specified.)   HAND DOMINANCE: Right   ADLs: Overall ADLs: States decreased ability to grab, hold household objects, pain and difficulty to open containers, perform FMS tasks (manipulate fasteners on clothing), difficulty with work tasks  FUNCTIONAL OUTCOME MEASURES: 04/30/24: PSFS: 4.9   Eval: Patient Specific Functional Scale: 4.8 (Open bottles, grip tightly, pick up items with force, extract teeth with force, hold handpiece steady and prepped teeth)  (Higher Score  =  Better Ability for the Selected Tasks)      UPPER EXTREMITY ROM     Shoulder to Wrist AROM Right eval Left eval Rt 03/26/24 Rt 04/30/24  Elbow flexion      Elbow extension      Forearm supination      Forearm pronation       Wrist flexion 63 64 54 59  Wrist extension 51 64 76 71  Wrist ulnar deviation      Wrist radial deviation      Functional dart thrower's motion (F-DTM) in ulnar flexion      F-DTM in radial extension       (Blank rows = not tested)   Hand AROM Right eval  Full Fist Ability (or Gap to Distal Palmar Crease) Full fist  Thumb Opposition  (Kapandji Scale)  Within normal limits  (Blank rows = not tested)   UPPER EXTREMITY MMT:     MMT Right 02/06/24 Rt 03/26/24 Rt 04/30/24  Shoulder flexion      Shoulder abduction     Shoulder adduction     Shoulder extension     Shoulder internal rotation     Shoulder external rotation     Middle trapezius     Lower trapezius     Elbow flexion     Elbow extension     Forearm supination     Forearm pronation     Wrist flexion 5/5 5/5   Wrist extension Painful 4-/5 4+/5 only mildly pulling  4+/5  Wrist ulnar deviation     Wrist radial deviation     (Blank rows = not tested)  HAND FUNCTION: 04/30/24:  Grip: Rt 62# WNL   03/26/24 Grip Rt: 42.8#    Eval: Observed weakness in affected Rt hand.  Grip strength Right: tender 39.5 lbs, Left: 47.7 lbs  Rt 3JC pinch 9# tender; Lt:10#    COORDINATION: Eval: No significant coordination deficits noted though she has some complaints.  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 OT also discusses home and functional tasks with the pt and reviews goals.  Unfortunately she is rating her ability approximately the same as the start of care, potentially due to the fact that she did not attend therapy frequently, shows signs of not doing home exercises correctly today, and admittedly not doing them consistently.  She also continues to work which is an exacerbating factor for her.  OT tries to explain again that daily activities can be  wear and tear on muscles and tendons, and need to be managed well with exercises and stretches.  It is normal for a tendinitis to recur in the future if not well-managed.  Fortunately, her grip strength is improved and her strength is fair.  OT reviews her home exercises with her making corrections as needed, also reviewing eccentric strengthening which she admits to not doing at all.  She was advised to continue stretching consistently 3-4 times a day for a month, doing light eccentric strengthening with 2 to 3 pounds consistently 4-5 times a week for at least a month, until these things feel easy and completely pain-free.  At that point, she can upgrade her strengthening, but she was recommended to always perform certain stretches and strengthening 4-5 times a week for normal body maintenance, as suggested by the CDC, [employer], and many other sources.  She should not expect her body to be perfect without any maintenance.  At the end of the session she states understanding all directions and that she would like to discharge her therapy to focus on these things herself.  She was invited to return with a new order at any time for any complications or new issues related to the upper body.    Exercises reviewed today - Triceps Stretch- Do on back of chair   - 3-4 x daily - 3-5 reps - 15 hold - Seated Wrist Flexion Stretch  - 3-4 x daily - 3 reps - 15 second  hold - Wrist Prayer Stretch  - 4 x daily - 3-5 reps - 15 sec hold - Seated Eccentric Wrist Extension  - 2 x daily - 4-5 x weekly - 1 sets - 5-10 reps - 10 sec hold - Eccentric Bicep Strength  - 2 x daily - 4-5 x weekly - 1 sets - 5-10 reps - 10 sec hold   PATIENT EDUCATION: Education details: See tx section above for details  Person educated: Patient Education method: Verbal Instruction, Teach back, Handouts  Education comprehension: States and demonstrates understanding   HOME EXERCISE PROGRAM: Access Code:  [email or web address] URL: [email or web address] Date: 02/27/2024 Prepared by: Melvenia Ada   GOALS: Goals reviewed with patient? Yes   SHORT TERM GOALS: (STG required if POC>30 days) Target Date: 03/14/2024  Pt will obtain protective, custom orthotic. Goal status: 04/22/24: N/A discharge  2.  Pt will demo/state understanding of initial HEP to improve pain levels and prerequisite motion. Goal status: 03/26/24: Goal met   LONG TERM GOALS: Target Date: 04/22/2024  Pt will improve functional ability by decreased impairment per PSFS assessment from 4.8 to 6.8 or better, for better quality of life. Goal status: 04/30/24: Goal not met  2.  Pt will improve grip strength in right hand from 40 lbs to at least 45 lbs for functional use at home and in IADLs. Goal status: 04/30/24: Goal met  3.  Pt will improve A/ROM in right wrist extension from 51 degrees to at least 60 degrees, to have functional motion for tasks like reach and grasp.  Goal status: 04/30/24: Goal met  4.  Pt will improve strength in right wrist extension from 4 -/5 MMT to at least 4+/5 MMT to have increased functional ability to carry out selfcare and higher-level homecare tasks with less difficulty. Goal status: 04/30/24: Goal met  5.  Pt will decrease pain at worst from moderate  to none significant  to have better sleep and occupational participation in daily roles. Goal status: 04/30/24: Goal met   ASSESSMENT:  CLINICAL IMPRESSION: 04/30/24: Fortunately she did meet 4 out of 5 long-term goals, but still  having some tenderness and problems at the elbow.  She admits to not doing her home exercises consistently, and she only came to therapy very infrequently.  She does understand good maintenance strategies for this issue, and she was recommended to continue consistent stretches and light eccentric strengthening for at least a month, at which point she should transition to typical stretching and strengthening program 4  to 5 days a week to help prevent tendinitis and other forms of pain and problems (as she admits to not exercising at all typically).  Will discharge therapy today due to maximum benefit received from outpatient occupational therapy at this time.  She is welcome to return with a new order for any new or continued problems in the future as needed.    PLAN:  OT FREQUENCY: 1 last visit   OT DURATION: 3 additional weeks from 04/11/24 - 04/30/24 and up to 3 total visits to cover today's last visit  PLANNED INTERVENTIONS: 97535 self care/ADL training, 02889 therapeutic exercise, 97530 therapeutic activity, 97112 neuromuscular re-education, 97140 manual therapy, 97035 ultrasound, 97032 electrical stimulation (manual), 97760 Orthotic Initial, [id number] Orthotic/Prosthetic subsequent, compression bandaging, Dry needling, energy conservation, coping strategies training, and patient/family education  CONSULTED AND AGREED WITH PLAN OF CARE: Patient  PLAN FOR NEXT SESSION:   N/A/discharge   Melvenia Ada, OTR/L, CHT  04/30/2024, 2:57 PM   "

## 2024-05-20 ENCOUNTER — Telehealth: Payer: Self-pay | Admitting: "Endocrinology

## 2024-05-20 DIAGNOSIS — E782 Mixed hyperlipidemia: Secondary | ICD-10-CM

## 2024-05-20 DIAGNOSIS — E042 Nontoxic multinodular goiter: Secondary | ICD-10-CM

## 2024-05-20 DIAGNOSIS — E039 Hypothyroidism, unspecified: Secondary | ICD-10-CM

## 2024-05-20 DIAGNOSIS — R7303 Prediabetes: Secondary | ICD-10-CM

## 2024-05-20 NOTE — Telephone Encounter (Signed)
Update labs please

## 2024-05-21 NOTE — Telephone Encounter (Signed)
 Labs updated and sent to [facility].

## 2024-05-30 ENCOUNTER — Ambulatory Visit: Admitting: "Endocrinology

## 2024-07-09 ENCOUNTER — Ambulatory Visit: Admitting: "Endocrinology
